# Patient Record
Sex: Male | Born: 1950 | Race: Black or African American | Hispanic: No | Marital: Married | Smoking: Never smoker
Health system: Southern US, Community
[De-identification: ages and names within clinical notes are randomized; demographics above are authoritative.]

## PROBLEM LIST (undated history)

## (undated) DIAGNOSIS — E785 Hyperlipidemia, unspecified: Secondary | ICD-10-CM

## (undated) DIAGNOSIS — N529 Male erectile dysfunction, unspecified: Secondary | ICD-10-CM

## (undated) DIAGNOSIS — I10 Essential (primary) hypertension: Secondary | ICD-10-CM

## (undated) DIAGNOSIS — M1990 Unspecified osteoarthritis, unspecified site: Secondary | ICD-10-CM

## (undated) HISTORY — DX: Essential (primary) hypertension: I10

## (undated) HISTORY — DX: Hyperlipidemia, unspecified: E78.5

## (undated) HISTORY — PX: NO PAST SURGERIES: SHX2092

---

## 2008-09-12 DIAGNOSIS — E559 Vitamin D deficiency, unspecified: Secondary | ICD-10-CM | POA: Insufficient documentation

## 2009-01-26 ENCOUNTER — Ambulatory Visit: Payer: Self-pay | Admitting: Family Medicine

## 2009-08-08 ENCOUNTER — Ambulatory Visit: Payer: Self-pay | Admitting: Family Medicine

## 2009-08-15 ENCOUNTER — Ambulatory Visit: Payer: Self-pay | Admitting: Unknown Physician Specialty

## 2009-08-15 HISTORY — PX: COLONOSCOPY: SHX174

## 2009-08-15 LAB — HM COLONOSCOPY

## 2010-08-07 ENCOUNTER — Ambulatory Visit: Payer: Self-pay | Admitting: Family Medicine

## 2011-03-05 LAB — HEPATIC FUNCTION PANEL
ALK PHOS: 63 U/L (ref 25–125)
ALT: 16 U/L (ref 10–40)
AST: 18 U/L (ref 14–40)
Bilirubin, Total: 0.4 mg/dL

## 2011-03-05 LAB — CBC AND DIFFERENTIAL
HEMATOCRIT: 40 % — AB (ref 41–53)
HEMOGLOBIN: 13.5 g/dL (ref 13.5–17.5)
Platelets: 167 10*3/uL (ref 150–399)
WBC: 4.3 10*3/mL

## 2013-03-16 LAB — TSH: TSH: 0.83 u[IU]/mL (ref 0.41–5.90)

## 2014-05-27 HISTORY — PX: EYE SURGERY: SHX253

## 2014-06-20 LAB — BASIC METABOLIC PANEL
BUN: 16 mg/dL (ref 4–21)
Creatinine: 1 mg/dL (ref 0.6–1.3)
GLUCOSE: 81 mg/dL
POTASSIUM: 3.9 mmol/L (ref 3.4–5.3)
SODIUM: 141 mmol/L (ref 137–147)

## 2014-06-20 LAB — LIPID PANEL
CHOLESTEROL: 196 mg/dL (ref 0–200)
HDL: 96 mg/dL — AB (ref 35–70)
LDL Cholesterol: 91 mg/dL
LDl/HDL Ratio: 0.9
TRIGLYCERIDES: 44 mg/dL (ref 40–160)

## 2014-10-20 LAB — PSA: PSA: 5.2

## 2015-05-27 ENCOUNTER — Other Ambulatory Visit: Payer: Self-pay | Admitting: Family Medicine

## 2015-06-01 ENCOUNTER — Other Ambulatory Visit: Payer: Self-pay | Admitting: Family Medicine

## 2015-07-07 ENCOUNTER — Other Ambulatory Visit: Payer: Self-pay | Admitting: Family Medicine

## 2015-08-17 DIAGNOSIS — H2513 Age-related nuclear cataract, bilateral: Secondary | ICD-10-CM | POA: Diagnosis not present

## 2015-08-17 DIAGNOSIS — H2589 Other age-related cataract: Secondary | ICD-10-CM | POA: Diagnosis not present

## 2015-08-21 ENCOUNTER — Other Ambulatory Visit: Payer: Self-pay | Admitting: Family Medicine

## 2015-09-07 DIAGNOSIS — H2511 Age-related nuclear cataract, right eye: Secondary | ICD-10-CM | POA: Diagnosis not present

## 2015-09-07 DIAGNOSIS — H2589 Other age-related cataract: Secondary | ICD-10-CM | POA: Diagnosis not present

## 2015-12-21 ENCOUNTER — Ambulatory Visit (INDEPENDENT_AMBULATORY_CARE_PROVIDER_SITE_OTHER): Payer: Federal, State, Local not specified - PPO | Admitting: Family Medicine

## 2015-12-21 ENCOUNTER — Encounter: Payer: Self-pay | Admitting: *Deleted

## 2015-12-21 VITALS — BP 148/74 | HR 60 | Temp 98.0°F | Ht 69.0 in | Wt 162.0 lb

## 2015-12-21 DIAGNOSIS — R972 Elevated prostate specific antigen [PSA]: Secondary | ICD-10-CM | POA: Diagnosis not present

## 2015-12-21 DIAGNOSIS — K148 Other diseases of tongue: Secondary | ICD-10-CM

## 2015-12-21 DIAGNOSIS — Z8619 Personal history of other infectious and parasitic diseases: Secondary | ICD-10-CM | POA: Insufficient documentation

## 2015-12-21 DIAGNOSIS — F4320 Adjustment disorder, unspecified: Secondary | ICD-10-CM | POA: Insufficient documentation

## 2015-12-21 DIAGNOSIS — M5090 Cervical disc disorder, unspecified, unspecified cervical region: Secondary | ICD-10-CM | POA: Insufficient documentation

## 2015-12-21 DIAGNOSIS — I10 Essential (primary) hypertension: Secondary | ICD-10-CM

## 2015-12-21 DIAGNOSIS — G8929 Other chronic pain: Secondary | ICD-10-CM

## 2015-12-21 DIAGNOSIS — M549 Dorsalgia, unspecified: Secondary | ICD-10-CM

## 2015-12-21 DIAGNOSIS — N400 Enlarged prostate without lower urinary tract symptoms: Secondary | ICD-10-CM | POA: Insufficient documentation

## 2015-12-21 DIAGNOSIS — K4090 Unilateral inguinal hernia, without obstruction or gangrene, not specified as recurrent: Secondary | ICD-10-CM | POA: Diagnosis not present

## 2015-12-21 DIAGNOSIS — J309 Allergic rhinitis, unspecified: Secondary | ICD-10-CM | POA: Insufficient documentation

## 2015-12-21 DIAGNOSIS — N529 Male erectile dysfunction, unspecified: Secondary | ICD-10-CM | POA: Insufficient documentation

## 2015-12-21 MED ORDER — MELOXICAM 7.5 MG PO TABS: 7.5000 mg | ORAL_TABLET | Freq: Every day | ORAL | 5 refills | Status: DC

## 2015-12-21 NOTE — Progress Notes (Addendum)
Patient: Robert Patton, Male    DOB: [birth year], 65 y.o.   MRN: [id number] Visit Date: 12/21/2015  Today's Provider: [name], MD   Chief Complaint  Patient presents with  . Annual Exam   Subjective:   Robert Patton is a 65 y.o. male who presents today for his Subsequent Annual Wellness Visit. He feels well. He reports exercising daily. He reports he is sleeping well. Patient does have chronic low back pain . This is slowly worsened over the past few years. Hypertension is fairly well controlled. He does have a spot on his tongue that is been there for many years. He says he bit his tongue years ago and it has not changed. 08/15/2009 Colonoscopy-internal hemorrhoids, repeat 10 years.  Review of Systems  Constitutional: Negative.   HENT: Negative.   Eyes: Negative.   Respiratory: Negative.   Cardiovascular: Negative.   Gastrointestinal: Negative.   Endocrine: Negative.   Genitourinary: Negative.   Musculoskeletal: Positive for back pain (and waste pain).  Skin: Negative.   Allergic/Immunologic: Negative.   Neurological: Negative.   Hematological: Negative.   Psychiatric/Behavioral: Negative.     Patient Active Problem List   Diagnosis Date Noted  . Adaptation reaction 12/21/2015  . Allergic rhinitis 12/21/2015  . Back pain, chronic 12/21/2015  . 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 Cervical disc disease 12/21/2015  . History of chicken pox 12/21/2015  . 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 Hernia, inguinal, unilateral 01/02/2009  . Avitaminosis D 09/12/2008  . Essential (primary) hypertension 05/28/2003    Social History   Social History  . Marital status: Married    Spouse name: N/A  . Number of children: N/A  . Years of education: N/A   Occupational History  . Not on file.   Social History Main Topics  . Smoking status: Never Smoker  . Smokeless tobacco: Never Used  . Alcohol use No  . Drug use: No  . Sexual activity: Not on  file   Other Topics Concern  . Not on file   Social History Narrative  . No narrative on file    Past Surgical History:  Procedure Laterality Date  . NO PAST SURGERIES      His family history includes Hypertension in his brother.    Outpatient Medications Prior to Visit  Medication Sig Dispense Refill  . aspirin 81 MG tablet Take by mouth.    . clonazePAM (KLONOPIN) 0.5 MG tablet Take by mouth.    . hydrochlorothiazide (HYDRODIURIL) 25 MG tablet TAKE 1 TABLET BY MOUTH EVERY DAY 30 tablet 12  . MULTIPLE VITAMIN PO Take by mouth.    . naproxen (NAPROSYN) 500 MG tablet Take by mouth.    . Vitamin D, Ergocalciferol, (DRISDOL) 50000 units CAPS capsule TAKE 1 CAPSULE BY MOUTH WEEKLY 12 capsule 04  . sildenafil (REVATIO) 20 MG tablet Take by mouth.     No facility-administered medications prior to visit.     Allergies  Allergen Reactions  . Amlodipine Besylate     Insomnia    Patient Care Team: Jerrol Banana., MD as PCP - General (Family Medicine)  Objective:   Vitals:  Vitals:   12/21/15 1034  BP: (!) 148/74  Pulse: 60  Temp: 98 F (36.7 C)  TempSrc: Oral  Weight: 162 lb (73.5 kg)  Height: 5\' 9"  (1.753 m)    Physical Exam  Constitutional: He is oriented to person, place, and time. He appears well-developed and well-nourished.  HENT:  Head: Normocephalic and atraumatic.  Right Ear: External ear normal.  Left Ear: External ear normal.  Nose: Nose normal.  Mouth/Throat: Oropharynx is clear and moist.  On the left mid section of the tongue there is a fleshy mass. It appears to be normal tissue that has never been evaluated.  Eyes: Conjunctivae and EOM are normal. Pupils are equal, round, and reactive to light.  Neck: Normal range of motion. Neck supple.  Cardiovascular: Normal rate, regular rhythm, normal heart sounds and intact distal pulses.   Pulmonary/Chest: Effort normal and breath sounds normal.  Abdominal: Soft. Bowel sounds are normal.   Genitourinary: Rectum normal, prostate normal and penis normal.  Genitourinary Comments: He does have a small right inguinal hernia that is easily reduced.  Musculoskeletal: Normal range of motion.  Neurological: He is alert and oriented to person, place, and time. He has normal reflexes.  Skin: Skin is warm and dry.  Psychiatric: He has a normal mood and affect. His behavior is normal. Judgment and thought content normal.    Activities of Daily Living In your present state of health, do you have any difficulty performing the following activities: 12/21/2015  Hearing? N  Vision? N  Difficulty concentrating or making decisions? N  Walking or climbing stairs? N  Dressing or bathing? N  Doing errands, shopping? N  Some recent data might be hidden    Fall Risk Assessment Fall Risk  12/21/2015  Falls in the past year? No     Depression Screen PHQ 2/9 Scores 12/21/2015  PHQ - 2 Score 0    Cognitive Testing - 6-CIT    Year: 0 4 points  Month: 0 3 points  Memorize "Pia Mau, [address], [place]"  Time (within 1 hour:) 0 3 points  Count backwards from 20: 0 2 4 points  Name months of year: 0 2 4 points  Repeat Address: 0 2 4 6 8 10  points   Total Score: 6/28  Interpretation : Normal (0-7) Abnormal (8-28)    Assessment & Plan:     Annual Wellness Visit  Reviewed patient's Family Medical History Reviewed and updated list of patient's medical providers Assessment of cognitive impairment was done Assessed patient's functional ability Established a written schedule for health screening [place] Completed and Reviewed  Exercise Activities and Dietary recommendations Goals    None      Immunization History  Administered Date(s) Administered  . Pneumococcal Conjugate-13 07/20/2014  . Tdap 04/08/2011  . Zoster 12/24/2011    Health Maintenance  Topic Date Due  . PNA vac Low Risk Adult (2 of 2 - PPSV23) 07/21/2015  . INFLUENZA VACCINE   12/26/2015  . COLONOSCOPY  08/16/2019  . TETANUS/TDAP  04/07/2021  . ZOSTAVAX  Completed  . Hepatitis C Screening  Completed        Discussed health benefits of physical activity, and encouraged him to engage in regular exercise appropriate for his age and condition.  Chronic low back pain Hypertension Hyperlipidemia Chronic mild elevation of PSA Fleshy tongue lesion Right inguinal hernia Obtain x-ray for the back pain. Will try meloxicam 7.5 mg daily for a month. I told him this is not a long-term treatment due to possible long-term side effects of the NSAID. Obtain lab work for his chronic medical issues. Refer to ENT to make sure that this is a benign tongue lesion. Refer to surgery for options for his inguinal hernia  Patient was seen and examined by Dr. Delfino Lovett L.  Cranford Mon and the note was scribed by Althea Charon, RMA.   Miguel Aschoff MD [facility] 12/21/2015 10:41 AM  ------------------------------------------------------------------------------------------------------------

## 2015-12-22 LAB — CBC WITH DIFFERENTIAL/PLATELET
BASOS ABS: 0 10*3/uL (ref 0.0–0.2)
Basos: 0 %
EOS (ABSOLUTE): 0.2 10*3/uL (ref 0.0–0.4)
Eos: 4 %
HEMATOCRIT: 40.6 % (ref 37.5–51.0)
HEMOGLOBIN: 13.8 g/dL (ref 12.6–17.7)
Immature Grans (Abs): 0 10*3/uL (ref 0.0–0.1)
Immature Granulocytes: 0 %
LYMPHS ABS: 2.3 10*3/uL (ref 0.7–3.1)
Lymphs: 42 %
MCH: 28.7 pg (ref 26.6–33.0)
MCHC: 34 g/dL (ref 31.5–35.7)
MCV: 84 fL (ref 79–97)
MONOCYTES: 5 %
MONOS ABS: 0.2 10*3/uL (ref 0.1–0.9)
NEUTROS ABS: 2.6 10*3/uL (ref 1.4–7.0)
Neutrophils: 49 %
Platelets: 183 10*3/uL (ref 150–379)
RBC: 4.81 x10E6/uL (ref 4.14–5.80)
RDW: 14 % (ref 12.3–15.4)
WBC: 5.4 10*3/uL (ref 3.4–10.8)

## 2015-12-22 LAB — COMPREHENSIVE METABOLIC PANEL
ALBUMIN: 4.2 g/dL (ref 3.6–4.8)
ALK PHOS: 71 IU/L (ref 39–117)
ALT: 13 IU/L (ref 0–44)
AST: 22 IU/L (ref 0–40)
Albumin/Globulin Ratio: 1.4 (ref 1.2–2.2)
BILIRUBIN TOTAL: 0.3 mg/dL (ref 0.0–1.2)
BUN / CREAT RATIO: 11 (ref 10–24)
BUN: 12 mg/dL (ref 8–27)
CHLORIDE: 102 mmol/L (ref 96–106)
CO2: 26 mmol/L (ref 18–29)
Calcium: 9.3 mg/dL (ref 8.6–10.2)
Creatinine, Ser: 1.09 mg/dL (ref 0.76–1.27)
GFR calc Af Amer: 81 mL/min/{1.73_m2} (ref >59)
GFR calc non Af Amer: 70 mL/min/{1.73_m2} (ref >59)
GLOBULIN, TOTAL: 2.9 g/dL (ref 1.5–4.5)
GLUCOSE: 80 mg/dL (ref 65–99)
Potassium: 3.8 mmol/L (ref 3.5–5.2)
SODIUM: 142 mmol/L (ref 134–144)
Total Protein: 7.1 g/dL (ref 6.0–8.5)

## 2015-12-22 LAB — PSA: PROSTATE SPECIFIC AG, SERUM: 5 ng/mL — AB (ref 0.0–4.0)

## 2015-12-22 LAB — TSH: TSH: 1.29 u[IU]/mL (ref 0.450–4.500)

## 2015-12-26 ENCOUNTER — Ambulatory Visit: Payer: Self-pay | Admitting: General Surgery

## 2015-12-29 ENCOUNTER — Telehealth: Payer: Self-pay | Admitting: Family Medicine

## 2015-12-29 NOTE — Telephone Encounter (Signed)
Pt called saying that the meloxicam 7.5 is not helping his lower back pain.  Please advise.  He was in on 7/27.  PCB is [phone]  Thanks [name]

## 2015-12-29 NOTE — Telephone Encounter (Signed)
Please review-aa 

## 2015-12-30 NOTE — Telephone Encounter (Signed)
Try twice a day just for the rest of August. May need physical therapy referral.

## 2016-01-01 ENCOUNTER — Telehealth: Payer: Self-pay | Admitting: Family Medicine

## 2016-01-01 NOTE — Telephone Encounter (Signed)
Pt would like a call back to get his lab results. Thanks TNP

## 2016-01-01 NOTE — Telephone Encounter (Signed)
Advised patient as below.  

## 2016-01-01 NOTE — Telephone Encounter (Signed)
Advised patient of lab results  

## 2016-01-02 ENCOUNTER — Telehealth: Payer: Self-pay | Admitting: Family Medicine

## 2016-01-02 NOTE — Telephone Encounter (Signed)
His call back is [phone].  Thanks [name]  Sorry closed out message too soon.

## 2016-01-02 NOTE — Telephone Encounter (Signed)
Pt informed. Labs ready for pick up.

## 2016-01-02 NOTE — Telephone Encounter (Signed)
Pt would like for [place] to print out a copy of labs and he will come by and pick up a copy.  His call back is

## 2016-01-10 ENCOUNTER — Telehealth: Payer: Self-pay | Admitting: Family Medicine

## 2016-01-10 MED ORDER — HYDROCODONE-HOMATROPINE 5-1.5 MG/5ML PO SYRP: 5.0000 mL | ORAL_SOLUTION | Freq: Three times a day (TID) | ORAL | 0 refills | Status: DC | PRN

## 2016-01-10 NOTE — Telephone Encounter (Signed)
Pt would like to get a medication for cough.  He always coughs at night.  He uses [facility] [address]  PCB#  [phone]  Thanks, [name]

## 2016-01-10 NOTE — Telephone Encounter (Signed)
Patient reports that he has had a cough for about 1 week. He has been using OTC allergy medication with no relief. He reports that his cough is worse at night. Patient denies any fever or shortness of breath. Patient reports that he had left over prescription cough syrup which helped last night. Patient is requesting a refill. Please advise. Thanks!

## 2016-01-10 NOTE — Telephone Encounter (Signed)
2 oz rf --thx

## 2016-01-10 NOTE — Telephone Encounter (Signed)
Printed

## 2016-01-12 ENCOUNTER — Telehealth: Payer: Self-pay | Admitting: Family Medicine

## 2016-01-19 DIAGNOSIS — D101 Benign neoplasm of tongue: Secondary | ICD-10-CM | POA: Diagnosis not present

## 2016-02-01 NOTE — Telephone Encounter (Signed)
Pt would like to pick up a letter stating he had his CPE so he can turn it into his employer [employer]. Pt was scheduled for his CPE on 12/21/15, not sure if it turned into an OV. Please advise. Thanks TNP

## 2016-02-01 NOTE — Telephone Encounter (Signed)
It was both. Okay to write a note.

## 2016-02-01 NOTE — Telephone Encounter (Signed)
It was CPE with additional issues. Please review-aa

## 2016-02-06 NOTE — Telephone Encounter (Signed)
Pt called for an update about letter for work. I advised that if he needed Dr. Rosanna Randy to sign the letter it wouldn't be ready until next week because Dr. Rosanna Randy is out of the office this week but if someone else in the office could sign it, it would be ready this week. Pt stated that he would contact his employer and call us back. Thanks TNP

## 2016-02-07 NOTE — Telephone Encounter (Signed)
Letter typed up and waiting for the provider to come back to the office on 9/18 to sign-aa

## 2016-02-08 ENCOUNTER — Encounter: Payer: Self-pay | Admitting: *Deleted

## 2016-02-12 NOTE — Telephone Encounter (Signed)
Pt advised, letter placed up front-aa

## 2016-03-04 ENCOUNTER — Ambulatory Visit: Payer: Self-pay | Admitting: General Surgery

## 2016-03-06 ENCOUNTER — Ambulatory Visit: Payer: Self-pay | Admitting: General Surgery

## 2016-03-12 ENCOUNTER — Encounter: Payer: Self-pay | Admitting: General Surgery

## 2016-03-12 ENCOUNTER — Ambulatory Visit (INDEPENDENT_AMBULATORY_CARE_PROVIDER_SITE_OTHER): Payer: Commercial Managed Care - PPO | Admitting: General Surgery

## 2016-03-12 VITALS — BP 166/86 | HR 60 | Resp 12 | Ht 69.0 in | Wt 166.0 lb

## 2016-03-12 DIAGNOSIS — K4090 Unilateral inguinal hernia, without obstruction or gangrene, not specified as recurrent: Secondary | ICD-10-CM | POA: Diagnosis not present

## 2016-03-12 NOTE — Patient Instructions (Addendum)
The patient is aware to call back for any questions or concerns. Inguinal Hernia, Adult Muscles help keep everything in the body in its proper place. But if a weak spot in the muscles develops, something can poke through. That is called a hernia. When this happens in the lower part of the belly (abdomen), it is called an inguinal hernia. (It takes its name from a part of the body in this region called the inguinal canal.) A weak spot in the wall of muscles lets some fat or part of the small intestine bulge through. An inguinal hernia can develop at any age. Men get them more often than women. CAUSES  In adults, an inguinal hernia develops over time.  It can be triggered by:  Suddenly straining the muscles of the lower abdomen.  Lifting heavy objects.  Straining to have a bowel movement. Difficult bowel movements (constipation) can lead to this.  Constant coughing. This may be caused by smoking or lung disease.  Being overweight.  Being pregnant.  Working at a job that requires long periods of standing or heavy lifting.  Having had an inguinal hernia before. One type can be an emergency situation. It is called a strangulated inguinal hernia. It develops if part of the small intestine slips through the weak spot and cannot get back into the abdomen. The blood supply can be cut off. If that happens, part of the intestine may die. This situation requires emergency surgery. SYMPTOMS  Often, a small inguinal hernia has no symptoms. It is found when a healthcare provider does a physical exam. Larger hernias usually have symptoms.   In adults, symptoms may include:  A lump in the groin. This is easier to see when the person is standing. It might disappear when lying down.  In men, a lump in the scrotum.  Pain or burning in the groin. This occurs especially when lifting, straining or coughing.  A dull ache or feeling of pressure in the groin.  Signs of a strangulated hernia can  include:  A bulge in the groin that becomes very painful and tender to the touch.  A bulge that turns red or purple.  Fever, nausea and vomiting.  Inability to have a bowel movement or to pass gas. DIAGNOSIS  To decide if you have an inguinal hernia, a healthcare provider will probably do a physical examination.  This will include asking questions about any symptoms you have noticed.  The healthcare provider might feel the groin area and ask you to cough. If an inguinal hernia is felt, the healthcare provider may try to slide it back into the abdomen.  Usually no other tests are needed. TREATMENT  Treatments can vary. The size of the hernia makes a difference. Options include:  Watchful waiting. This is often suggested if the hernia is small and you have had no symptoms.  No medical procedure will be done unless symptoms develop.  You will need to watch closely for symptoms. If any occur, contact your healthcare provider right away.  Surgery. This is used if the hernia is larger or you have symptoms.  Open surgery. This is usually an outpatient procedure (you will not stay overnight in a hospital). An cut (incision) is made through the skin in the groin. The hernia is put back inside the abdomen. The weak area in the muscles is then repaired by herniorrhaphy or hernioplasty. Herniorrhaphy: in this type of surgery, the weak muscles are sewn back together. Hernioplasty: a patch or mesh is   used to close the weak area in the abdominal wall.  Laparoscopy. In this procedure, a surgeon makes small incisions. A thin tube with a tiny video camera (called a laparoscope) is put into the abdomen. The surgeon repairs the hernia with mesh by looking with the video camera and using two long instruments. HOME CARE INSTRUCTIONS   After surgery to repair an inguinal hernia:  You will need to take pain medicine prescribed by your healthcare provider. Follow all directions carefully.  You will need  to take care of the wound from the incision.  Your activity will be restricted for awhile. This will probably include no heavy lifting for several weeks. You also should not do anything too active for a few weeks. When you can return to work will depend on the type of job that you have.  During "watchful waiting" periods, you should:  Maintain a healthy weight.  Eat a diet high in fiber (fruits, vegetables and whole grains).  Drink plenty of fluids to avoid constipation. This means drinking enough water and other liquids to keep your urine clear or pale yellow.  Do not lift heavy objects.  Do not stand for long periods of time.  Quit smoking. This should keep you from developing a frequent cough. SEEK MEDICAL CARE IF:   A bulge develops in your groin area.  You feel pain, a burning sensation or pressure in the groin. This might be worse if you are lifting or straining.  You develop a fever of more than 100.5 F (38.1 C). SEEK IMMEDIATE MEDICAL CARE IF:   Pain in the groin increases suddenly.  A bulge in the groin gets bigger suddenly and does not go down.  For men, there is sudden pain in the scrotum. Or, the size of the scrotum increases.  A bulge in the groin area becomes red or purple and is painful to touch.  You have nausea or vomiting that does not go away.  You feel your heart beating much faster than normal.  You cannot have a bowel movement or pass gas.  You develop a fever of more than 102.0 F (38.9 C).   This information is not intended to replace advice given to you by your health care provider. Make sure you discuss any questions you have with your health care provider.   Document Released: 09/29/2008 Document Revised: 08/05/2011 Document Reviewed: 11/14/2014 Elsevier Interactive Patient Education 2016 Elsevier Inc.  

## 2016-03-12 NOTE — Progress Notes (Signed)
Patient ID: Robert Patton, male   DOB: 13-Feb-1951, 65 y.o.   MRN: [id number]  Chief Complaint  Patient presents with  . Other    hernia    HPI Robert Patton is a 65 y.o. male here today for a evaluation of a right inguinal hernia. Patient states he noticed this area since 2007. He states there is no pain or change in size. He is still working a s anurse. I have reviewed the history of present illness with the patient.  HPI  Past Medical History:  Diagnosis Date  . Hyperlipidemia   . Hypertension     Past Surgical History:  Procedure Laterality Date  . COLONOSCOPY  08/15/2009  . NO PAST SURGERIES      Family History  Problem Relation Age of Onset  . Hypertension Brother     Social History Social History  Substance Use Topics  . Smoking status: Never Smoker  . Smokeless tobacco: Never Used  . Alcohol use No    Allergies  Allergen Reactions  . Amlodipine Besylate     Insomnia    Current Outpatient Prescriptions  Medication Sig Dispense Refill  . aspirin 81 MG tablet Take by mouth.    . clonazePAM (KLONOPIN) 0.5 MG tablet Take by mouth.    . hydrochlorothiazide (HYDRODIURIL) 25 MG tablet TAKE 1 TABLET BY MOUTH EVERY DAY 30 tablet 12  . meloxicam (MOBIC) 7.5 MG tablet Take 1 tablet (7.5 mg total) by mouth daily. 30 tablet 5  . MULTIPLE VITAMIN PO Take by mouth.    . naproxen (NAPROSYN) 500 MG tablet Take by mouth.    . Vitamin D, Ergocalciferol, (DRISDOL) 50000 units CAPS capsule TAKE 1 CAPSULE BY MOUTH WEEKLY 12 capsule 04   No current facility-administered medications for this visit.     Review of Systems Review of Systems  Constitutional: Negative.   Respiratory: Negative.   Cardiovascular: Negative.     Blood pressure (!) 166/86, pulse 60, resp. rate 12, height 5\' 9"  (1.753 m), weight 166 lb (75.3 kg).  Physical Exam Physical Exam  Constitutional: He is oriented to person, place, and time. He appears well-developed and well-nourished.  Eyes:  Conjunctivae are normal. No scleral icterus.  Neck: Neck supple.  Cardiovascular: Normal rate, regular rhythm and normal heart sounds.   Pulmonary/Chest: Effort normal and breath sounds normal.  Abdominal: Soft. Normal appearance and bowel sounds are normal. There is no hepatomegaly. There is no tenderness. A hernia is present. Hernia confirmed positive in the right inguinal area ( medium to large sized hernia, reducible, mildly tender).  Lymphadenopathy:    He has no cervical adenopathy.  Neurological: He is alert and oriented to person, place, and time.  Skin: Skin is warm and dry.    Data Reviewed Notes reviewed   Assessment    Right inguinal hernia     Plan       Hernia precautions and incarceration were discussed with the patient. If they develop symptoms of an incarcerated hernia, they were encouraged to seek prompt medical attention.  I have recommended repair of the hernia using mesh on an outpatient basis in the near future. The risk of infection was reviewed. The role of prosthetic mesh to minimize the risk of recurrence was reviewed. Both laparoscopic and open approaches explained  This information has been scribed by Gaspar Cola CMA.  SANKAR,SEEPLAPUTHUR G 03/12/2016, 11:28 AM

## 2016-03-26 ENCOUNTER — Ambulatory Visit: Payer: Medicare Other | Admitting: General Surgery

## 2016-04-10 ENCOUNTER — Encounter: Payer: Self-pay | Admitting: Family Medicine

## 2016-04-10 ENCOUNTER — Ambulatory Visit (INDEPENDENT_AMBULATORY_CARE_PROVIDER_SITE_OTHER): Payer: Federal, State, Local not specified - PPO | Admitting: Family Medicine

## 2016-04-10 VITALS — BP 158/80 | HR 72 | Temp 98.1°F | Resp 16 | Wt 167.0 lb

## 2016-04-10 DIAGNOSIS — N529 Male erectile dysfunction, unspecified: Secondary | ICD-10-CM

## 2016-04-10 DIAGNOSIS — I10 Essential (primary) hypertension: Secondary | ICD-10-CM | POA: Diagnosis not present

## 2016-04-10 MED ORDER — LOSARTAN POTASSIUM 50 MG PO TABS: 50.0000 mg | ORAL_TABLET | Freq: Every day | ORAL | 12 refills | Status: DC

## 2016-04-10 MED ORDER — SILDENAFIL CITRATE 20 MG PO TABS: 20.0000 mg | ORAL_TABLET | Freq: Every day | ORAL | 11 refills | Status: DC | PRN

## 2016-04-10 NOTE — Progress Notes (Signed)
Subjective:  HPI Pt is here for elevated BP. He reports that his BP has been running in the 150's/80's. He is taking his HCTZ 25 mg daily. Denies chest pain shortness of breath. He is intolerant to Amlodipine per his chart.  He requests refills for his ED. No issues with energy or libido.  Prior to Admission medications   Medication Sig Start Date End Date Taking? Authorizing Provider  aspirin 81 MG tablet Take by mouth.    Historical Provider, MD  clonazePAM (KLONOPIN) 0.5 MG tablet Take by mouth. 03/16/13   Historical Provider, MD  hydrochlorothiazide (HYDRODIURIL) 25 MG tablet TAKE 1 TABLET BY MOUTH EVERY DAY 08/21/15   Jerrol Banana., MD  meloxicam (MOBIC) 7.5 MG tablet Take 1 tablet (7.5 mg total) by mouth daily. 12/21/15   Grayce Budden Maceo Pro., MD  MULTIPLE VITAMIN PO Take by mouth.    Historical Provider, MD  naproxen (NAPROSYN) 500 MG tablet Take by mouth. 06/21/14   Historical Provider, MD  Vitamin D, Ergocalciferol, (DRISDOL) 50000 units CAPS capsule TAKE 1 CAPSULE BY MOUTH WEEKLY 05/27/15   Birdie Sons, MD    Patient Active Problem List   Diagnosis Date Noted  . Adaptation reaction 12/21/2015  . Allergic rhinitis 12/21/2015  . Back pain, chronic 12/21/2015  . 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 Cervical disc disease 12/21/2015  . History of chicken pox 12/21/2015  . 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 Hernia, inguinal, unilateral 01/02/2009  . Avitaminosis D 09/12/2008  . Essential (primary) hypertension 05/28/2003    Past Medical History:  Diagnosis Date  . Hyperlipidemia   . Hypertension     Social History   Social History  . Marital status: Married    Spouse name: N/A  . Number of children: N/A  . Years of education: N/A   Occupational History  . Not on file.   Social History Main Topics  . Smoking status: Never Smoker  . Smokeless tobacco: Never Used  . Alcohol use No  . Drug use: No  . Sexual activity: Not  on file   Other Topics Concern  . Not on file   Social History Narrative  . No narrative on file    Allergies  Allergen Reactions  . Amlodipine Besylate     Insomnia    Review of Systems  Constitutional: Negative.   HENT: Negative.   Eyes: Negative.   Respiratory: Negative.   Cardiovascular: Negative.   Gastrointestinal: Negative.   Genitourinary: Negative.   Musculoskeletal: Positive for back pain.  Skin: Negative.   Neurological: Negative.   Endo/Heme/Allergies: Negative.   Psychiatric/Behavioral: Negative.     Immunization History  Administered Date(s) Administered  . Pneumococcal Conjugate-13 07/20/2014  . Tdap 04/08/2011  . Zoster 12/24/2011    Objective:  BP (!) 158/80 (BP Location: Left Arm, Patient Position: Sitting, Cuff Size: Normal)   Pulse 72   Temp 98.1 F (36.7 C) (Oral)   Resp 16   Wt 167 lb (75.8 kg)   BMI 24.66 kg/m   Physical Exam  Constitutional: He is oriented to person, place, and time and well-developed, well-nourished, and in no distress.  Eyes: Conjunctivae and EOM are normal. Pupils are equal, round, and reactive to light.  Cardiovascular: Normal rate, regular rhythm, normal heart sounds and intact distal pulses.   Pulmonary/Chest: Effort normal.  Musculoskeletal: Normal range of motion.  Neurological: He is alert and oriented to person, place, and time. He has normal  reflexes. Gait normal. GCS score is 15.  Skin: Skin is warm and dry.  Psychiatric: Mood, memory, affect and judgment normal.    Lab Results  Component Value Date   WBC 5.4 12/21/2015   HGB 13.5 03/05/2011   HCT 40.6 12/21/2015   PLT 183 12/21/2015   GLUCOSE 80 12/21/2015   CHOL 196 06/20/2014   TRIG 44 06/20/2014   HDL 96 (A) 06/20/2014   LDLCALC 91 06/20/2014   TSH 1.290 12/21/2015   PSA 5.2 10/20/2014    CMP     Component Value Date/Time   NA 142 12/21/2015 1117   K 3.8 12/21/2015 1117   CL 102 12/21/2015 1117   CO2 26 12/21/2015 1117   GLUCOSE 80  12/21/2015 1117   BUN 12 12/21/2015 1117   CREATININE 1.09 12/21/2015 1117   CALCIUM 9.3 12/21/2015 1117   PROT 7.1 12/21/2015 1117   ALBUMIN 4.2 12/21/2015 1117   AST 22 12/21/2015 1117   ALT 13 12/21/2015 1117   ALKPHOS 71 12/21/2015 1117   BILITOT 0.3 12/21/2015 1117   GFRNONAA 70 12/21/2015 1117   GFRAA 81 12/21/2015 1117    Assessment and Plan :  1. Essential (primary) hypertension Add losartan as below. Follow up in 1 month.Goal of systolic less than [id number]. - losartan (COZAAR) 50 MG tablet; Take 1 tablet (50 mg total) by mouth daily.  Dispense: 30 tablet; Refill: 12  2. ED (erectile dysfunction) of organic origin  - sildenafil (REVATIO) 20 MG tablet; Take 1 tablet (20 mg total) by mouth daily as needed. Take 1-4 daily PRN sexual activity  Dispense: 25 tablet; Refill: 11   HPI, Exam, and A&P Transcribed under the direction and in the presence of Marlon Suleiman L. Cranford Mon, MD  Electronically Signed: Webb Laws, CMA I have done the exam and reviewed the above chart and it is accurate to the best of my knowledge. Development worker, community has been used in this note in any air is in the dictation or transcription are unintentional.   [facility] 04/10/2016 3:47 PM

## 2016-05-23 ENCOUNTER — Encounter: Payer: Self-pay | Admitting: *Deleted

## 2016-06-03 ENCOUNTER — Encounter: Payer: Self-pay | Admitting: Family Medicine

## 2016-06-03 ENCOUNTER — Ambulatory Visit (INDEPENDENT_AMBULATORY_CARE_PROVIDER_SITE_OTHER): Payer: Commercial Managed Care - PPO | Admitting: Family Medicine

## 2016-06-03 VITALS — BP 172/84 | HR 64 | Temp 98.2°F | Resp 16 | Wt 173.0 lb

## 2016-06-03 DIAGNOSIS — R059 Cough, unspecified: Secondary | ICD-10-CM

## 2016-06-03 DIAGNOSIS — J410 Simple chronic bronchitis: Secondary | ICD-10-CM

## 2016-06-03 DIAGNOSIS — R05 Cough: Secondary | ICD-10-CM | POA: Diagnosis not present

## 2016-06-03 DIAGNOSIS — I10 Essential (primary) hypertension: Secondary | ICD-10-CM

## 2016-06-03 DIAGNOSIS — N529 Male erectile dysfunction, unspecified: Secondary | ICD-10-CM

## 2016-06-03 DIAGNOSIS — G8929 Other chronic pain: Secondary | ICD-10-CM | POA: Diagnosis not present

## 2016-06-03 DIAGNOSIS — M545 Low back pain: Secondary | ICD-10-CM | POA: Diagnosis not present

## 2016-06-03 MED ORDER — MELOXICAM 7.5 MG PO TABS: 7.5000 mg | ORAL_TABLET | Freq: Every day | ORAL | 5 refills | Status: DC | PRN

## 2016-06-03 MED ORDER — SILDENAFIL CITRATE 20 MG PO TABS: 20.0000 mg | ORAL_TABLET | Freq: Every day | ORAL | 11 refills | Status: DC | PRN

## 2016-06-03 MED ORDER — DOXYCYCLINE HYCLATE 100 MG PO TABS: 100.0000 mg | ORAL_TABLET | Freq: Two times a day (BID) | ORAL | 0 refills | Status: DC

## 2016-06-03 MED ORDER — HYDROCODONE-HOMATROPINE 5-1.5 MG/5ML PO SYRP: 5.0000 mL | ORAL_SOLUTION | Freq: Three times a day (TID) | ORAL | 0 refills | Status: DC | PRN

## 2016-06-03 MED ORDER — LOSARTAN POTASSIUM 100 MG PO TABS: 100.0000 mg | ORAL_TABLET | Freq: Every day | ORAL | 3 refills | Status: DC

## 2016-06-03 NOTE — Patient Instructions (Signed)
For blood pressure increase Losartan to 100 mg and new prescription for this dose was sent in to your pharmacy.

## 2016-06-03 NOTE — Progress Notes (Signed)
Robert Patton  MRN: [id number] DOB: [birth year]  Subjective:  HPI  Patient started to feel bad last week-05/27/16, symptoms are runny nose, headache, chills, cough- productive with brownish phlegm and cough makes it hard for patient to rest at night. No ear pain, no sore throat. He has taking OTC allergy medications and gel capsules for the cough. He feels like he is getting worse. His blood pressure is still not well controlled at home. Systolic in the [id number]. Patient Active Problem List   Diagnosis Date Noted  . Adaptation reaction 12/21/2015  . Allergic rhinitis 12/21/2015  . Back pain, chronic 12/21/2015  . 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 Cervical disc disease 12/21/2015  . History of chicken pox 12/21/2015  . 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 Hernia, inguinal, unilateral 01/02/2009  . Avitaminosis D 09/12/2008  . Essential (primary) hypertension 05/28/2003    Past Medical History:  Diagnosis Date  . Hyperlipidemia   . Hypertension     Social History   Social History  . Marital status: Married    Spouse name: N/A  . Number of children: N/A  . Years of education: N/A   Occupational History  . Not on file.   Social History Main Topics  . Smoking status: Never Smoker  . Smokeless tobacco: Never Used  . Alcohol use No  . Drug use: No  . Sexual activity: Not on file   Other Topics Concern  . Not on file   Social History Narrative  . No narrative on file    Outpatient Encounter Prescriptions as of 06/03/2016  Medication Sig Note  . aspirin 81 MG tablet Take by mouth. 12/21/2015: Received from: [place]: ASPIRIN EC, 81MG  (Oral Tablet Delayed Release) - Historical Medication  1 daily (81 MG) Active  . clonazePAM (KLONOPIN) 0.5 MG tablet Take by mouth. 12/21/2015: Medication taken as needed.  Received from: [place]: CLONAZEPAM, 0.5MG  (Oral Tablet)  1/2 to one Tablet twice  daily as needed for nerves or sleep for 0 days  Quantity: 30;  Refills: 0   Ordered :16-Mar-2013    . hydrochlorothiazide (HYDRODIURIL) 25 MG tablet TAKE 1 TABLET BY MOUTH EVERY DAY   . losartan (COZAAR) 50 MG tablet Take 1 tablet (50 mg total) by mouth daily.   . meloxicam (MOBIC) 7.5 MG tablet Take 1 tablet (7.5 mg total) by mouth daily.   . MULTIPLE VITAMIN PO Take by mouth. 12/21/2015: Received from: [place]: MULTIVITAMINS (Oral Tablet) - Historical Medication  1 Daily Active  . naproxen (NAPROSYN) 500 MG tablet Take by mouth. 12/21/2015: Medication taken as needed.  Received from: [place]: NAPROXEN, 500MG  (Oral Tablet)  1 (one) Tablet two times daily as needed for back pain for 0 days  Quantity: 30;  Refills: 1   Ordered :21-Jun-2014  Fisher,  . sildenafil (REVATIO) 20 MG tablet Take 1 tablet (20 mg total) by mouth daily as needed. Take 1-4 daily PRN sexual activity   . Vitamin D, Ergocalciferol, (DRISDOL) 50000 units CAPS capsule TAKE 1 CAPSULE BY MOUTH WEEKLY    No facility-administered encounter medications on file as of 06/03/2016.     Allergies  Allergen Reactions  . Amlodipine Besylate     Insomnia    Review of Systems  Constitutional: Positive for chills and malaise/fatigue.  HENT: Positive for congestion.   Respiratory: Positive for cough and sputum production. Negative for shortness of breath and  wheezing.   Cardiovascular: Negative.   Musculoskeletal: Positive for back pain and joint pain.  Neurological: Positive for headaches.    Objective:  BP (!) 172/84   Pulse 64   Temp 98.2 F (36.8 C)   Resp 16   Wt 173 lb (78.5 kg)   SpO2 98%   BMI 25.55 kg/m   Physical Exam  Constitutional: He is oriented to person, place, and time and well-developed, well-nourished, and in no distress.  HENT:  Head: Normocephalic and atraumatic.  Right Ear: External ear normal.  Left Ear: External ear normal.  Nose:  Nose normal.  Mouth/Throat: Oropharynx is clear and moist.  Eyes: Conjunctivae are normal. Pupils are equal, round, and reactive to light.  Neck: Normal range of motion. Neck supple. No thyromegaly present.  Cardiovascular: Normal rate, regular rhythm, normal heart sounds and intact distal pulses.   No murmur heard. Pulmonary/Chest: Effort normal and breath sounds normal. No respiratory distress. He has no wheezes.  Lymphadenopathy:    He has no cervical adenopathy.  Neurological: He is alert and oriented to person, place, and time. Gait normal.  Skin: Skin is warm and dry.  Psychiatric: Mood, memory, affect and judgment normal.    Assessment and Plan :  1. Essential (primary) hypertension Elevated still. Increase Losartan to 100 mg. Re check on the next visit.Patient says he is intolerant of amlodipine. - losartan (COZAAR) 100 MG tablet; Take 1 tablet (100 mg total) by mouth daily.  Dispense: 90 tablet; Refill: 3  2. Cough RX for Hycodan syrup given.  3. Simple chronic bronchitis (HCC) Treat with Doxy and follow as needed. I'm treating this as the patient is feeling worse over the past few days instead of better. Refilled Meloxicam-patient takes it as needed for lower back pain.  HPI, Exam and A&P transcribed under direction and in the presence of Miguel Aschoff, MD. ,I have done the exam and reviewed the chart and it is accurate to the best of my knowledge. Development worker, community has been used and  any errors in dictation or transcription are unintentional. Miguel Aschoff M.D. [facility]

## 2016-06-18 ENCOUNTER — Telehealth: Payer: Self-pay

## 2016-06-18 ENCOUNTER — Other Ambulatory Visit: Payer: Self-pay

## 2016-06-18 MED ORDER — HYDROCODONE-HOMATROPINE 5-1.5 MG/5ML PO SYRP
5.0000 mL | ORAL_SOLUTION | Freq: Three times a day (TID) | ORAL | 0 refills | Status: DC | PRN
Start: 2016-06-18 — End: 2016-08-01

## 2016-06-18 MED ORDER — LEVOFLOXACIN 500 MG PO TABS: 500.0000 mg | ORAL_TABLET | Freq: Every day | ORAL | 0 refills | Status: DC

## 2016-06-18 NOTE — Telephone Encounter (Signed)
Pt was seen on 06/03/2016 for bronchitis. Was treated with Doxy and Hycodan. Pt reports he is not feeling better. Is still c/o productive cough (white sputum), rhinorrhea, sinus pain. Denies headache. Is afebrile. Finished Doxy one week ago. [place]. CB# [phone]. Renaldo Fiddler, CMA

## 2016-06-18 NOTE — Progress Notes (Unsigned)
n

## 2016-06-18 NOTE — Telephone Encounter (Signed)
Please review-aa 

## 2016-06-18 NOTE — Telephone Encounter (Signed)
Levaquin 500mg  daily for 1 week. Will need follow-up visit with someone if he does not improve from this

## 2016-07-01 ENCOUNTER — Ambulatory Visit: Payer: Commercial Managed Care - PPO | Admitting: Family Medicine

## 2016-07-02 ENCOUNTER — Ambulatory Visit (INDEPENDENT_AMBULATORY_CARE_PROVIDER_SITE_OTHER): Payer: Commercial Managed Care - PPO | Admitting: Family Medicine

## 2016-07-02 ENCOUNTER — Encounter: Payer: Self-pay | Admitting: Family Medicine

## 2016-07-02 VITALS — BP 170/90 | HR 78 | Temp 97.8°F | Resp 16 | Wt 171.0 lb

## 2016-07-02 DIAGNOSIS — G629 Polyneuropathy, unspecified: Secondary | ICD-10-CM | POA: Diagnosis not present

## 2016-07-02 DIAGNOSIS — R531 Weakness: Secondary | ICD-10-CM

## 2016-07-02 DIAGNOSIS — N529 Male erectile dysfunction, unspecified: Secondary | ICD-10-CM

## 2016-07-02 DIAGNOSIS — R5383 Other fatigue: Secondary | ICD-10-CM

## 2016-07-02 DIAGNOSIS — I10 Essential (primary) hypertension: Secondary | ICD-10-CM

## 2016-07-02 MED ORDER — DILTIAZEM HCL ER 180 MG PO CP24: 180.0000 mg | ORAL_CAPSULE | Freq: Every day | ORAL | 12 refills | Status: DC

## 2016-07-02 NOTE — Progress Notes (Signed)
Subjective:  HPI  Hypertension, follow-up:  BP Readings from Last 3 Encounters:  07/02/16 (!) 170/90  06/03/16 (!) 172/84  04/10/16 (!) 158/80    He was last seen for hypertension 4 weeks ago.  BP at that visit was 172/84. Management since that visit includes increased losartan to 100 mg daily. He reports good compliance with treatment. He is not having side effects.  Outside blood pressures are 160's/80's. He is experiencing fatigue.  Patient denies chest pain, chest pressure/discomfort, claudication, dyspnea, exertional chest pressure/discomfort, irregular heart beat, lower extremity edema, near-syncope, orthopnea, palpitations, paroxysmal nocturnal dyspnea, syncope and tachypnea.   Cardiovascular risk factors include advanced age (older than 41 for men, 73 for women), hypertension and male gender.  Wt Readings from Last 3 Encounters:  07/02/16 171 lb (77.6 kg)  06/03/16 173 lb (78.5 kg)  04/10/16 167 lb (75.8 kg)   ------------------------------------------------------------------------ Bronchitis- Pt reports that he is no longer coughing. Pt reports that he is weak and can not sleep. He reports that this started with his bronchitis and use of the antibiotics. Denies any shortness of breath or chest pain, dizziness or lightheadedness.   Prior to Admission medications   Medication Sig Start Date End Date Taking? Authorizing Provider  aspirin 81 MG tablet Take by mouth.    Historical Provider, MD  clonazePAM (KLONOPIN) 0.5 MG tablet Take by mouth. 03/16/13   Historical Provider, MD  doxycycline (VIBRA-TABS) 100 MG tablet Take 1 tablet (100 mg total) by mouth 2 (two) times daily. 06/03/16   Jerrol Banana., MD  hydrochlorothiazide (HYDRODIURIL) 25 MG tablet TAKE 1 TABLET BY MOUTH EVERY DAY 08/21/15   Jerrol Banana., MD  HYDROcodone-homatropine [facility]) 5-1.5 MG/5ML syrup Take 5 mLs by mouth every 8 (eight) hours as needed for cough. 06/18/16   Richard Maceo Pro.,  MD  levofloxacin (LEVAQUIN) 500 MG tablet Take 1 tablet (500 mg total) by mouth daily. 06/18/16   Richard Maceo Pro., MD  losartan (COZAAR) 100 MG tablet Take 1 tablet (100 mg total) by mouth daily. 06/03/16   Richard Maceo Pro., MD  meloxicam (MOBIC) 7.5 MG tablet Take 1 tablet (7.5 mg total) by mouth daily as needed for pain. 06/03/16   Richard Maceo Pro., MD  MULTIPLE VITAMIN PO Take by mouth.    Historical Provider, MD  naproxen (NAPROSYN) 500 MG tablet Take by mouth. 06/21/14   Historical Provider, MD  sildenafil (REVATIO) 20 MG tablet Take 1 tablet (20 mg total) by mouth daily as needed. Take 1-4 daily PRN sexual activity 06/03/16   Jerrol Banana., MD  Vitamin D, Ergocalciferol, (DRISDOL) 50000 units CAPS capsule TAKE 1 CAPSULE BY MOUTH WEEKLY 05/27/15   Birdie Sons, MD    Patient Active Problem List   Diagnosis Date Noted  . Adaptation reaction 12/21/2015  . Allergic rhinitis 12/21/2015  . Back pain, chronic 12/21/2015  . 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 Cervical disc disease 12/21/2015  . History of chicken pox 12/21/2015  . 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 Hernia, inguinal, unilateral 01/02/2009  . Avitaminosis D 09/12/2008  . Essential (primary) hypertension 05/28/2003    Past Medical History:  Diagnosis Date  . Hyperlipidemia   . Hypertension     Social History   Social History  . Marital status: Married    Spouse name: N/A  . Number of children: N/A  . Years of education: N/A   Occupational History  .  Not on file.   Social History Main Topics  . Smoking status: Never Smoker  . Smokeless tobacco: Never Used  . Alcohol use No  . Drug use: No  . Sexual activity: Not on file   Other Topics Concern  . Not on file   Social History Narrative  . No narrative on file    Allergies  Allergen Reactions  . Amlodipine Besylate     Insomnia    Review of Systems  Constitutional: Positive for malaise/fatigue.    HENT: Negative.   Eyes: Negative.   Respiratory: Negative.   Cardiovascular: Negative.   Gastrointestinal: Negative.   Genitourinary: Negative.   Musculoskeletal: Negative.   Skin: Negative.   Neurological: Positive for weakness.  Endo/Heme/Allergies: Negative.   Psychiatric/Behavioral: The patient has insomnia.     Immunization History  Administered Date(s) Administered  . Pneumococcal Conjugate-13 07/20/2014  . Tdap 04/08/2011  . Zoster 12/24/2011    Objective:  BP (!) 170/90 (BP Location: Left Arm, Patient Position: Sitting, Cuff Size: Normal)   Pulse 78   Temp 97.8 F (36.6 C) (Oral)   Resp 16   Wt 171 lb (77.6 kg)   SpO2 98%   BMI 25.25 kg/m   Physical Exam  Constitutional: He is oriented to person, place, and time and well-developed, well-nourished, and in no distress.  Eyes: Conjunctivae and EOM are normal. Pupils are equal, round, and reactive to light.  Neck: Normal range of motion. Neck supple.  Cardiovascular: Normal rate, regular rhythm, normal heart sounds and intact distal pulses.   Pulmonary/Chest: Effort normal and breath sounds normal.  Abdominal: Soft. Bowel sounds are normal.  Musculoskeletal: Normal range of motion.  Neurological: He is alert and oriented to person, place, and time. He has normal reflexes. Gait normal. GCS score is 15.  Skin: Skin is warm and dry.  Psychiatric: Mood, memory, affect and judgment normal.    Lab Results  Component Value Date   WBC 5.4 12/21/2015   HGB 13.5 03/05/2011   HCT 40.6 12/21/2015   PLT 183 12/21/2015   GLUCOSE 80 12/21/2015   CHOL 196 06/20/2014   TRIG 44 06/20/2014   HDL 96 (A) 06/20/2014   LDLCALC 91 06/20/2014   TSH 1.290 12/21/2015   PSA 5.2 10/20/2014    CMP     Component Value Date/Time   NA 142 12/21/2015 1117   K 3.8 12/21/2015 1117   CL 102 12/21/2015 1117   CO2 26 12/21/2015 1117   GLUCOSE 80 12/21/2015 1117   BUN 12 12/21/2015 1117   CREATININE 1.09 12/21/2015 1117   CALCIUM  9.3 12/21/2015 1117   PROT 7.1 12/21/2015 1117   ALBUMIN 4.2 12/21/2015 1117   AST 22 12/21/2015 1117   ALT 13 12/21/2015 1117   ALKPHOS 71 12/21/2015 1117   BILITOT 0.3 12/21/2015 1117   GFRNONAA 70 12/21/2015 1117   GFRAA 81 12/21/2015 1117    Assessment and Plan :  1. Essential (primary) hypertension Still elevated Will start Diltiazem and follow up in 1 month. Will need further work up if not improved.  - diltiazem (DILACOR XR) 180 MG 24 hr capsule; Take 1 capsule (180 mg total) by mouth daily.  Dispense: 30 capsule; Refill: 12 - CBC with Differential/Platelet - TSH  2. ED (erectile dysfunction) of organic origin   3. Other fatigue  - TSH - Comprehensive metabolic panel - Sedimentation rate  4. Neuropathy (HCC)  - Hemoglobin A1c - Vitamin B12  5. Generalized weakness Very nonspecific. -  ANA - CK   HPI, Exam, and A&P Transcribed under the direction and in the presence of Richard L. Cranford Mon, MD  Electronically Signed: Katina Dung, CMA I have done the exam and reviewed the above chart and it is accurate to the best of my knowledge. Development worker, community has been used in this note in any air is in the dictation or transcription are unintentional.  [facility] 07/02/2016 8:38 AM

## 2016-07-03 LAB — CBC WITH DIFFERENTIAL/PLATELET
BASOS ABS: 0 10*3/uL (ref 0.0–0.2)
Basos: 0 %
EOS (ABSOLUTE): 0.1 10*3/uL (ref 0.0–0.4)
Eos: 2 %
HEMATOCRIT: 40.7 % (ref 37.5–51.0)
HEMOGLOBIN: 13.5 g/dL (ref 13.0–17.7)
Immature Grans (Abs): 0 10*3/uL (ref 0.0–0.1)
Immature Granulocytes: 0 %
LYMPHS ABS: 2.3 10*3/uL (ref 0.7–3.1)
Lymphs: 44 %
MCH: 28.1 pg (ref 26.6–33.0)
MCHC: 33.2 g/dL (ref 31.5–35.7)
MCV: 85 fL (ref 79–97)
MONOS ABS: 0.4 10*3/uL (ref 0.1–0.9)
Monocytes: 8 %
NEUTROS ABS: 2.5 10*3/uL (ref 1.4–7.0)
Neutrophils: 46 %
Platelets: 212 10*3/uL (ref 150–379)
RBC: 4.8 x10E6/uL (ref 4.14–5.80)
RDW: 14.7 % (ref 12.3–15.4)
WBC: 5.3 10*3/uL (ref 3.4–10.8)

## 2016-07-03 LAB — COMPREHENSIVE METABOLIC PANEL
A/G RATIO: 1.6 (ref 1.2–2.2)
ALBUMIN: 4.3 g/dL (ref 3.6–4.8)
ALK PHOS: 86 IU/L (ref 39–117)
ALT: 20 IU/L (ref 0–44)
AST: 22 IU/L (ref 0–40)
BILIRUBIN TOTAL: 0.4 mg/dL (ref 0.0–1.2)
BUN / CREAT RATIO: 15 (ref 10–24)
BUN: 15 mg/dL (ref 8–27)
CHLORIDE: 105 mmol/L (ref 96–106)
CO2: 25 mmol/L (ref 18–29)
Calcium: 9.4 mg/dL (ref 8.6–10.2)
Creatinine, Ser: 1.02 mg/dL (ref 0.76–1.27)
GFR calc non Af Amer: 76 mL/min/{1.73_m2} (ref >59)
GFR, EST AFRICAN AMERICAN: 88 mL/min/{1.73_m2} (ref >59)
GLUCOSE: 96 mg/dL (ref 65–99)
Globulin, Total: 2.7 g/dL (ref 1.5–4.5)
POTASSIUM: 4.2 mmol/L (ref 3.5–5.2)
SODIUM: 145 mmol/L — AB (ref 134–144)
TOTAL PROTEIN: 7 g/dL (ref 6.0–8.5)

## 2016-07-03 LAB — TSH: TSH: 1.6 u[IU]/mL (ref 0.450–4.500)

## 2016-07-03 LAB — CK: Total CK: 271 U/L — ABNORMAL HIGH (ref 24–204)

## 2016-07-03 LAB — VITAMIN B12: Vitamin B-12: 1135 pg/mL (ref 232–1245)

## 2016-07-03 LAB — SEDIMENTATION RATE: Sed Rate: 11 mm/hr (ref 0–30)

## 2016-07-03 LAB — HEMOGLOBIN A1C
Est. average glucose Bld gHb Est-mCnc: 114 mg/dL
HEMOGLOBIN A1C: 5.6 % (ref 4.8–5.6)

## 2016-07-03 LAB — ANA: Anti Nuclear Antibody(ANA): NEGATIVE

## 2016-07-26 ENCOUNTER — Other Ambulatory Visit: Payer: Self-pay | Admitting: Family Medicine

## 2016-08-01 ENCOUNTER — Ambulatory Visit (INDEPENDENT_AMBULATORY_CARE_PROVIDER_SITE_OTHER): Payer: Commercial Managed Care - PPO | Admitting: Family Medicine

## 2016-08-01 VITALS — BP 152/74 | HR 58 | Resp 14 | Wt 168.0 lb

## 2016-08-01 DIAGNOSIS — N529 Male erectile dysfunction, unspecified: Secondary | ICD-10-CM

## 2016-08-01 DIAGNOSIS — I10 Essential (primary) hypertension: Secondary | ICD-10-CM

## 2016-08-01 MED ORDER — HYDRALAZINE HCL 25 MG PO TABS: 25.0000 mg | ORAL_TABLET | Freq: Two times a day (BID) | ORAL | 5 refills | Status: DC

## 2016-08-01 NOTE — Progress Notes (Signed)
Robert Patton  MRN: [id number] DOB: 11/10/1950  Subjective:  HPI  Patient is here for follow up on b/p. LOV was on 07/02/16 and patient was started on Diltiazem. Today when patient checked his b/p it was 160/82, yesterday 158/70, readings are staying around that. This medication has effected his ability to have an erection-makes this weaker. BP Readings from Last 3 Encounters:  08/01/16 (!) 152/74  07/02/16 (!) 170/90  06/03/16 (!) 172/84   Patient Active Problem List   Diagnosis Date Noted  . Adaptation reaction 12/21/2015  . Allergic rhinitis 12/21/2015  . Back pain, chronic 12/21/2015  . 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 Cervical disc disease 12/21/2015  . History of chicken pox 12/21/2015  . 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 Hernia, inguinal, unilateral 01/02/2009  . Avitaminosis D 09/12/2008  . Essential (primary) hypertension 05/28/2003    Past Medical History:  Diagnosis Date  . Hyperlipidemia   . Hypertension     Social History   Social History  . Marital status: Married    Spouse name: N/A  . Number of children: N/A  . Years of education: N/A   Occupational History  . Not on file.   Social History Main Topics  . Smoking status: Never Smoker  . Smokeless tobacco: Never Used  . Alcohol use No  . Drug use: No  . Sexual activity: Not on file   Other Topics Concern  . Not on file   Social History Narrative  . No narrative on file    Outpatient Encounter Prescriptions as of 08/01/2016  Medication Sig Note  . aspirin 81 MG tablet Take by mouth. 12/21/2015: Received from: [place]: ASPIRIN EC, 81MG  (Oral Tablet Delayed Release) - Historical Medication  1 daily (81 MG) Active  . clonazePAM (KLONOPIN) 0.5 MG tablet Take by mouth. 12/21/2015: Medication taken as needed.  Received from: [place]: CLONAZEPAM, 0.5MG  (Oral Tablet)  1/2 to one Tablet twice daily as  needed for nerves or sleep for 0 days  Quantity: 30;  Refills: 0   Ordered :16-Mar-2013    . diltiazem (DILACOR XR) 180 MG 24 hr capsule Take 1 capsule (180 mg total) by mouth daily.   . hydrochlorothiazide (HYDRODIURIL) 25 MG tablet TAKE 1 TABLET BY MOUTH EVERY DAY   . losartan (COZAAR) 100 MG tablet Take 1 tablet (100 mg total) by mouth daily.   . meloxicam (MOBIC) 7.5 MG tablet Take 1 tablet (7.5 mg total) by mouth daily as needed for pain.   Marland Kitchen MULTIPLE VITAMIN PO Take by mouth. 12/21/2015: Received from: [place]: MULTIVITAMINS (Oral Tablet) - Historical Medication  1 Daily Active  . naproxen (NAPROSYN) 500 MG tablet Take by mouth. 12/21/2015: Medication taken as needed.  Received from: [place]: NAPROXEN, 500MG  (Oral Tablet)  1 (one) Tablet two times daily as needed for back pain for 0 days  Quantity: 30;  Refills: 1   Ordered :21-Jun-2014  Fisher,  . sildenafil (REVATIO) 20 MG tablet Take 1 tablet (20 mg total) by mouth daily as needed. Take 1-4 daily PRN sexual activity   . Vitamin D, Ergocalciferol, (DRISDOL) 50000 units CAPS capsule TAKE 1 CAPSULE BY MOUTH WEEKLY   . [DISCONTINUED] doxycycline (VIBRA-TABS) 100 MG tablet Take 1 tablet (100 mg total) by mouth 2 (two) times daily. (Patient not taking: Reported on 07/02/2016)   . [DISCONTINUED] HYDROcodone-homatropine (HYCODAN) 5-1.5 MG/5ML syrup Take 5 mLs by  mouth every 8 (eight) hours as needed for cough. (Patient not taking: Reported on 07/02/2016)   . [DISCONTINUED] levofloxacin (LEVAQUIN) 500 MG tablet Take 1 tablet (500 mg total) by mouth daily. (Patient not taking: Reported on 07/02/2016)    No facility-administered encounter medications on file as of 08/01/2016.     Allergies  Allergen Reactions  . Amlodipine Besylate     Insomnia    Review of Systems  Constitutional: Negative.   Respiratory: Negative.   Cardiovascular: Negative.   Genitourinary:       Erection issue    Musculoskeletal: Positive for back pain and joint pain.       At times    Objective:  BP (!) 152/74   Pulse (!) 58   Resp 14   Wt 168 lb (76.2 kg)   BMI 24.81 kg/m   Physical Exam  Constitutional: He is oriented to person, place, and time and well-developed, well-nourished, and in no distress.  HENT:  Head: Normocephalic and atraumatic.  Right Ear: External ear normal.  Left Ear: External ear normal.  Nose: Nose normal.  Eyes: Conjunctivae are normal. Pupils are equal, round, and reactive to light.  Neck: Normal range of motion. Neck supple. No thyromegaly present.  Cardiovascular: Normal rate, regular rhythm, normal heart sounds and intact distal pulses.   No murmur heard. Pulmonary/Chest: Effort normal and breath sounds normal. No respiratory distress. He has no wheezes.  Abdominal: Soft.  Musculoskeletal: He exhibits no edema or tenderness.  Neurological: He is alert and oriented to person, place, and time.  Skin: Skin is warm and dry.  Psychiatric: Mood, memory, affect and judgment normal.    Assessment and Plan :  1. Essential (primary) hypertension Slightly better but not normal and patient is having side effects from Diltiazem. Switch to Hydralazine. Re check on the next visit.d/c Diltiazem.  2. ED (erectile dysfunction) of organic origin Libido normal per pt.Consider urology referrral.  HPI, Exam and A&P transcribed under direction and in the presence of Miguel Aschoff, MD.

## 2016-09-04 ENCOUNTER — Ambulatory Visit: Payer: Commercial Managed Care - PPO | Admitting: Family Medicine

## 2016-09-12 ENCOUNTER — Ambulatory Visit (INDEPENDENT_AMBULATORY_CARE_PROVIDER_SITE_OTHER): Payer: Commercial Managed Care - PPO | Admitting: Family Medicine

## 2016-09-12 VITALS — BP 178/88 | HR 60 | Temp 98.3°F | Resp 14 | Wt 162.0 lb

## 2016-09-12 DIAGNOSIS — R5383 Other fatigue: Secondary | ICD-10-CM | POA: Diagnosis not present

## 2016-09-12 DIAGNOSIS — I10 Essential (primary) hypertension: Secondary | ICD-10-CM | POA: Diagnosis not present

## 2016-09-12 MED ORDER — AMLODIPINE BESYLATE 5 MG PO TABS: 5.0000 mg | ORAL_TABLET | Freq: Every day | ORAL | 3 refills | Status: DC

## 2016-09-12 NOTE — Patient Instructions (Signed)
Add Amlodipine today. Take Amlodipine in the morning at this time. We are not stopping any medications at this time but may be able to stop some of the medications in the future if b/p is controlled.

## 2016-09-12 NOTE — Progress Notes (Signed)
Robert Patton  MRN: [id number] DOB: May 18, 1951  Subjective:  HPI  Patient is here for 1 month follow up. Diltiazem was switched to Hydralazine 2 times daily due to side effects from Diltiazem. He is tolerating Hydralazine but b/p is not better, he was taking medication 1 tablet daily until a week ago went to 2 tablets daily and has not seen a change. Readings have varied-200/90, 170/090. Dizziness present and head pressure is present, feels weak. No chest pain or tightness, no shortness of breath. BP Readings from Last 3 Encounters:  09/12/16 (!) 178/88  08/01/16 (!) 152/74  07/02/16 (!) 170/90   Patient Active Problem List   Diagnosis Date Noted  . Adaptation reaction 12/21/2015  . Allergic rhinitis 12/21/2015  . Back pain, chronic 12/21/2015  . 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 Cervical disc disease 12/21/2015  . History of chicken pox 12/21/2015  . 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 Hernia, inguinal, unilateral 01/02/2009  . Avitaminosis D 09/12/2008  . Essential (primary) hypertension 05/28/2003    Past Medical History:  Diagnosis Date  . Hyperlipidemia   . Hypertension     Social History   Social History  . Marital status: Married    Spouse name: N/A  . Number of children: N/A  . Years of education: N/A   Occupational History  . Not on file.   Social History Main Topics  . Smoking status: Never Smoker  . Smokeless tobacco: Never Used  . Alcohol use No  . Drug use: No  . Sexual activity: Not on file   Other Topics Concern  . Not on file   Social History Narrative  . No narrative on file    Outpatient Encounter Prescriptions as of 09/12/2016  Medication Sig Note  . aspirin 81 MG tablet Take by mouth. 12/21/2015: Received from: [place]: ASPIRIN EC, 81MG  (Oral Tablet Delayed Release) - Historical Medication  1 daily (81 MG) Active  . clonazePAM (KLONOPIN) 0.5 MG tablet Take by mouth.  12/21/2015: Medication taken as needed.  Received from: [place]: CLONAZEPAM, 0.5MG  (Oral Tablet)  1/2 to one Tablet twice daily as needed for nerves or sleep for 0 days  Quantity: 30;  Refills: 0   Ordered :16-Mar-2013    . hydrALAZINE (APRESOLINE) 25 MG tablet Take 1 tablet (25 mg total) by mouth 2 (two) times daily.   . hydrochlorothiazide (HYDRODIURIL) 25 MG tablet TAKE 1 TABLET BY MOUTH EVERY DAY   . losartan (COZAAR) 100 MG tablet Take 1 tablet (100 mg total) by mouth daily.   . meloxicam (MOBIC) 7.5 MG tablet Take 1 tablet (7.5 mg total) by mouth daily as needed for pain.   Marland Kitchen MULTIPLE VITAMIN PO Take by mouth. 12/21/2015: Received from: [place]: MULTIVITAMINS (Oral Tablet) - Historical Medication  1 Daily Active  . naproxen (NAPROSYN) 500 MG tablet Take by mouth. 12/21/2015: Medication taken as needed.  Received from: [place]: NAPROXEN, 500MG  (Oral Tablet)  1 (one) Tablet two times daily as needed for back pain for 0 days  Quantity: 30;  Refills: 1   Ordered :21-Jun-2014  [place],  . sildenafil (REVATIO) 20 MG tablet Take 1 tablet (20 mg total) by mouth daily as needed. Take 1-4 daily PRN sexual activity   . Vitamin D, Ergocalciferol, (DRISDOL) 50000 units CAPS capsule TAKE 1 CAPSULE BY MOUTH WEEKLY    No facility-administered encounter medications on file as of  09/12/2016.     Allergies  Allergen Reactions  . Amlodipine Besylate     Insomnia  . Diltiazem     Had issues sexually    Review of Systems  Constitutional: Positive for malaise/fatigue.  Respiratory: Negative.   Cardiovascular: Negative.   Musculoskeletal: Positive for back pain.  Neurological: Positive for dizziness and weakness.       Head pressure    Objective:  BP (!) 178/88   Pulse 60   Temp 98.3 F (36.8 C)   Resp 14   Wt 162 lb (73.5 kg)   SpO2 98%   BMI 23.92 kg/m   Physical Exam  Constitutional: He is  oriented to person, place, and time and well-developed, well-nourished, and in no distress.  HENT:  Head: Normocephalic and atraumatic.  Eyes: Conjunctivae are normal. Pupils are equal, round, and reactive to light.  Neck: Normal range of motion. Neck supple.  Cardiovascular: Normal rate, regular rhythm, normal heart sounds and intact distal pulses.   No murmur heard. Pulmonary/Chest: Effort normal and breath sounds normal. No respiratory distress. He has no wheezes.  Abdominal: Soft. Bowel sounds are normal. He exhibits no distension. There is no tenderness.  Neurological: He is alert and oriented to person, place, and time.   Assessment and Plan :  1. Essential (primary) hypertension Worse. Discussed with patient. Will re try Amlodipine again and advised patient to take it in the morning and see if that helps insomnia. Patient is agreeable. Will re check on the next visit. May have to refer patient to specialist to help with controlling b/p. Unfortunately pt has been very sensitive to possible med side effects.  2. Other fatigue  HPI, Exam and A&P transcribed by Theressa Millard, RMA under direction and in the presence of Miguel Aschoff, MD. I have done the exam and reviewed the chart and it is accurate to the best of my knowledge. Development worker, community has been used and  any errors in dictation or transcription are unintentional. Miguel Aschoff M.D. [facility]

## 2016-09-18 ENCOUNTER — Other Ambulatory Visit: Payer: Self-pay

## 2016-09-18 MED ORDER — AMLODIPINE BESYLATE 10 MG PO TABS: 10.0000 mg | ORAL_TABLET | Freq: Every day | ORAL | 3 refills | Status: DC

## 2016-09-30 ENCOUNTER — Encounter: Payer: Self-pay | Admitting: Family Medicine

## 2016-09-30 ENCOUNTER — Ambulatory Visit (INDEPENDENT_AMBULATORY_CARE_PROVIDER_SITE_OTHER): Payer: Commercial Managed Care - PPO | Admitting: Family Medicine

## 2016-09-30 VITALS — BP 134/82 | HR 68 | Temp 98.2°F | Resp 16 | Ht 69.0 in | Wt 164.0 lb

## 2016-09-30 DIAGNOSIS — I10 Essential (primary) hypertension: Secondary | ICD-10-CM | POA: Diagnosis not present

## 2016-09-30 NOTE — Patient Instructions (Addendum)
May discontinue hydralazine.

## 2016-09-30 NOTE — Progress Notes (Signed)
Patient: Robert Patton Male    DOB: 1950-12-20   66 y.o.   MRN: [id number] Visit Date: 09/30/2016  Today's Provider: [name], MD   Chief Complaint  Patient presents with  . Hypertension   Subjective:    HPI  Hypertension, follow-up:  BP Readings from Last 3 Encounters:  09/30/16 134/82  09/12/16 (!) 178/88  08/01/16 (!) 152/74    He was last seen for hypertension 3 weeks ago.  BP at that visit was 178/88. Management since that visit includes adding amlodipine 5mg . He reports good compliance with treatment. He is not having side effects.  He is not exercising. He is adherent to low salt diet.   Outside blood pressures are checked daily. Patient reports that BP averages in the 140s/80s. He is experiencing none.  Patient denies exertional chest pressure/discomfort, lower extremity edema and palpitations.    Weight trend: stable Wt Readings from Last 3 Encounters:  09/30/16 164 lb (74.4 kg)  09/12/16 162 lb (73.5 kg)  08/01/16 168 lb (76.2 kg)    Current diet: well balanced        Allergies  Allergen Reactions  . Amlodipine Besylate     Insomnia  . Diltiazem     Had issues sexually     Current Outpatient Prescriptions:  .  amLODipine (NORVASC) 10 MG tablet, Take 1 tablet (10 mg total) by mouth daily., Disp: 90 tablet, Rfl: 3 .  aspirin 81 MG tablet, Take by mouth., Disp: , Rfl:  .  clonazePAM (KLONOPIN) 0.5 MG tablet, Take by mouth., Disp: , Rfl:  .  hydrochlorothiazide (HYDRODIURIL) 25 MG tablet, TAKE 1 TABLET BY MOUTH EVERY DAY, Disp: 30 tablet, Rfl: 12 .  losartan (COZAAR) 100 MG tablet, Take 1 tablet (100 mg total) by mouth daily., Disp: 90 tablet, Rfl: 3 .  meloxicam (MOBIC) 7.5 MG tablet, Take 1 tablet (7.5 mg total) by mouth daily as needed for pain., Disp: 30 tablet, Rfl: 5 .  MULTIPLE VITAMIN PO, Take by mouth., Disp: , Rfl:  .  naproxen (NAPROSYN) 500 MG tablet, Take by mouth., Disp: , Rfl:  .  sildenafil (REVATIO) 20 MG tablet,  Take 1 tablet (20 mg total) by mouth daily as needed. Take 1-4 daily PRN sexual activity, Disp: 25 tablet, Rfl: 11 .  Vitamin D, Ergocalciferol, (DRISDOL) 50000 units CAPS capsule, TAKE 1 CAPSULE BY MOUTH WEEKLY, Disp: 12 capsule, Rfl: 4 .  hydrALAZINE (APRESOLINE) 25 MG tablet, Take 1 tablet (25 mg total) by mouth 2 (two) times daily. (Patient not taking: Reported on 09/30/2016), Disp: 60 tablet, Rfl: 5  Review of Systems  Constitutional: Negative.   HENT: Negative.   Eyes: Negative.   Respiratory: Negative.   Cardiovascular: Negative.   Endocrine: Negative.   Musculoskeletal: Negative.   Allergic/Immunologic: Negative.   Neurological: Positive for dizziness.  Hematological: Negative.   Psychiatric/Behavioral: Negative.     Social History  Substance Use Topics  . Smoking status: Never Smoker  . Smokeless tobacco: Never Used  . Alcohol use No   Objective:   BP 134/82 (BP Location: Right Arm, Patient Position: Sitting, Cuff Size: Normal)   Pulse 68   Temp 98.2 F (36.8 C)   Resp 16   Ht 5\' 9"  (1.753 m)   Wt 164 lb (74.4 kg)   SpO2 98%   BMI 24.22 kg/m  Vitals:   09/30/16 0821  BP: 134/82  Pulse: 68  Resp: 16  Temp: 98.2 F (36.8 C)  SpO2: 98%  Weight: 164 lb (74.4 kg)  Height: 5\' 9"  (1.753 m)     Physical Exam  Constitutional: He is oriented to person, place, and time. He appears well-developed and well-nourished.  HENT:  Head: Normocephalic and atraumatic.  Right Ear: External ear normal.  Left Ear: External ear normal.  Nose: Nose normal.  Eyes: Conjunctivae are normal.  Neck: Neck supple. No thyromegaly present.  Cardiovascular: Normal rate, regular rhythm and normal heart sounds.   Pulmonary/Chest: Effort normal and breath sounds normal.  Abdominal: Soft.  Musculoskeletal: Normal range of motion. He exhibits no edema.  Neurological: He is alert and oriented to person, place, and time.  Skin: Skin is warm and dry.  Psychiatric: He has a normal mood and  affect. His behavior is normal. Judgment and thought content normal.        Assessment & Plan:     1. Essential (primary) hypertension Improving. Will discontinue hydralazine 25mg  due to causing dizziness. Patient will F/U in 4-95mos for CPE.        I have done the exam and reviewed the above chart and it is accurate to the best of my knowledge. Development worker, community has been used in this note in any air is in the dictation or transcription are unintentional.  [name], MD  [place]

## 2016-10-01 ENCOUNTER — Ambulatory Visit: Payer: Commercial Managed Care - PPO | Admitting: Family Medicine

## 2016-10-04 ENCOUNTER — Emergency Department: Payer: Commercial Managed Care - PPO

## 2016-10-04 ENCOUNTER — Emergency Department
Admission: EM | Admit: 2016-10-04 | Discharge: 2016-10-04 | Disposition: A | Discharge status: Home / Self Care | Payer: Commercial Managed Care - PPO | Attending: Emergency Medicine | Admitting: Emergency Medicine

## 2016-10-04 DIAGNOSIS — R42 Dizziness and giddiness: Secondary | ICD-10-CM | POA: Insufficient documentation

## 2016-10-04 DIAGNOSIS — I10 Essential (primary) hypertension: Secondary | ICD-10-CM | POA: Insufficient documentation

## 2016-10-04 DIAGNOSIS — Z79899 Other long term (current) drug therapy: Secondary | ICD-10-CM | POA: Insufficient documentation

## 2016-10-04 DIAGNOSIS — Z7982 Long term (current) use of aspirin: Secondary | ICD-10-CM | POA: Insufficient documentation

## 2016-10-04 LAB — COMPREHENSIVE METABOLIC PANEL
ALT: 32 U/L (ref 17–63)
AST: 30 U/L (ref 15–41)
Albumin: 4.1 g/dL (ref 3.5–5.0)
Alkaline Phosphatase: 82 U/L (ref 38–126)
Anion gap: 6 (ref 5–15)
BILIRUBIN TOTAL: 0.4 mg/dL (ref 0.3–1.2)
BUN: 14 mg/dL (ref 6–20)
CALCIUM: 9.1 mg/dL (ref 8.9–10.3)
CO2: 29 mmol/L (ref 22–32)
CREATININE: 1.01 mg/dL (ref 0.61–1.24)
Chloride: 103 mmol/L (ref 101–111)
GFR calc Af Amer: >60 mL/min (ref >60)
GLUCOSE: 93 mg/dL (ref 65–99)
Potassium: 3.7 mmol/L (ref 3.5–5.1)
Sodium: 138 mmol/L (ref 135–145)
Total Protein: 7.4 g/dL (ref 6.5–8.1)

## 2016-10-04 LAB — CBC
HEMATOCRIT: 39.7 % — AB (ref 40.0–52.0)
Hemoglobin: 13.5 g/dL (ref 13.0–18.0)
MCH: 28.6 pg (ref 26.0–34.0)
MCHC: 34.1 g/dL (ref 32.0–36.0)
MCV: 83.8 fL (ref 80.0–100.0)
PLATELETS: 169 10*3/uL (ref 150–440)
RBC: 4.74 MIL/uL (ref 4.40–5.90)
RDW: 14.5 % (ref 11.5–14.5)
WBC: 4.4 10*3/uL (ref 3.8–10.6)

## 2016-10-04 LAB — TROPONIN I

## 2016-10-04 MED ORDER — MECLIZINE HCL 25 MG PO TABS
25.0000 mg | ORAL_TABLET | Freq: Once | ORAL | Status: AC
  Administered 2016-10-04: 25 mg via ORAL
  Filled 2016-10-04: qty 1

## 2016-10-04 MED ORDER — MECLIZINE HCL 32 MG PO TABS: 32.0000 mg | ORAL_TABLET | Freq: Three times a day (TID) | ORAL | 0 refills | Status: DC | PRN

## 2016-10-04 NOTE — ED Triage Notes (Signed)
Pt in with co dizziness since yest, denies any pain. States has had similar symptoms in the past and was told it was due to hypertension, pt also co nausea.

## 2016-10-04 NOTE — ED Notes (Signed)
Dr. Brown at the bedside for pt evaluation 

## 2016-10-04 NOTE — ED Provider Notes (Signed)
[facility] Emergency Department Provider Note  Time seen: 2:53 AM.  I have reviewed the triage vital signs and the nursing notes.   HISTORY  Chief Complaint Dizziness    HPI Robert Patton is a 66 y.o. male with below list of chronic medical conditions presents to the emergency department with intermittent dizziness times "several weeks". Patient states acute onset dizziness tonight after he left work which began abruptly. Patient describes it as "spinning" Patient denies any headache no weakness numbness gait instability. Patient denies any chest pain no shortness of breath. Patient does admit to "pressure" in bilateral ears.   Past Medical History:  Diagnosis Date  . Hyperlipidemia   . Hypertension     Patient Active Problem List   Diagnosis Date Noted  . Adaptation reaction 12/21/2015  . Allergic rhinitis 12/21/2015  . Back pain, chronic 12/21/2015  . 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 Cervical disc disease 12/21/2015  . History of chicken pox 12/21/2015  . 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 Hernia, inguinal, unilateral 01/02/2009  . Avitaminosis D 09/12/2008  . Essential (primary) hypertension 05/28/2003    Past Surgical History:  Procedure Laterality Date  . COLONOSCOPY  08/15/2009  . NO PAST SURGERIES      Prior to Admission medications   Medication Sig Start Date End Date Taking? Authorizing Provider  amLODipine (NORVASC) 10 MG tablet Take 1 tablet (10 mg total) by mouth daily. 09/18/16   Jerrol Banana., MD  aspirin 81 MG tablet Take by mouth.    [provider]  clonazePAM (KLONOPIN) 0.5 MG tablet Take by mouth. 03/16/13   [provider]  hydrochlorothiazide (HYDRODIURIL) 25 MG tablet TAKE 1 TABLET BY MOUTH EVERY DAY 08/21/15   Jerrol Banana., MD  losartan (COZAAR) 100 MG tablet Take 1 tablet (100 mg total) by mouth daily. 06/03/16   Jerrol Banana., MD  meloxicam  (MOBIC) 7.5 MG tablet Take 1 tablet (7.5 mg total) by mouth daily as needed for pain. 06/03/16   Jerrol Banana., MD  MULTIPLE VITAMIN PO Take by mouth.    [provider]  naproxen (NAPROSYN) 500 MG tablet Take by mouth. 06/21/14   [provider]  sildenafil (REVATIO) 20 MG tablet Take 1 tablet (20 mg total) by mouth daily as needed. Take 1-4 daily PRN sexual activity 06/03/16   Jerrol Banana., MD  Vitamin D, Ergocalciferol, (DRISDOL) 50000 units CAPS capsule TAKE 1 CAPSULE BY MOUTH WEEKLY 07/26/16   Birdie Sons, MD    Allergies Amlodipine besylate and Diltiazem  Family History  Problem Relation Age of Onset  . Hypertension Brother     Social History Social History  Substance Use Topics  . Smoking status: Never Smoker  . Smokeless tobacco: Never Used  . Alcohol use No    Review of Systems Constitutional: No fever/chills Eyes: No visual changes. ENT: No sore throat. Cardiovascular: Denies chest pain. Respiratory: Denies shortness of breath. Gastrointestinal: No abdominal pain.  No nausea, no vomiting.  No diarrhea.  No constipation. Genitourinary: Negative for dysuria. Musculoskeletal: Negative for back pain. Integumentary: Negative for rash. Neurological: Negative for headaches, focal weakness or numbness.Positive for dizziness   ____________________________________________   PHYSICAL EXAM:  VITAL SIGNS: ED Triage Vitals  Enc Vitals Group     BP 10/04/16 0140 (!) 155/89     Pulse Rate 10/04/16 0140 74     Resp 10/04/16 0140 18  Temp 10/04/16 0141 98.3 F (36.8 C)     Temp Source 10/04/16 0141 Oral     SpO2 10/04/16 0141 100 %     Weight 10/04/16 0141 164 lb (74.4 kg)     Height 10/04/16 0141 5\' 10"  (1.778 m)     Head Circumference --      Peak Flow --      Pain Score --      Pain Loc --      Pain Edu? --      Excl. in [place]? --     Constitutional: Alert and oriented. Well appearing and in no acute distress. Eyes:  Conjunctivae are normal. PERRL. EOMI. Head: Atraumatic. Ears:  Healthy appearing ear canals and TMs bilaterally Nose: No congestion/rhinnorhea. Mouth/Throat: Mucous membranes are moist. Oropharynx non-erythematous. Neck: No stridor.  Cardiovascular: Normal rate, regular rhythm. Good peripheral circulation. Grossly normal heart sounds. Respiratory: Normal respiratory effort.  No retractions. Lungs CTAB. Gastrointestinal: Soft and nontender. No distention.  Musculoskeletal: No lower extremity tenderness nor edema. No gross deformities of extremities. Neurologic:  Normal speech and language. No gross focal neurologic deficits are appreciated.  Skin:  Skin is warm, dry and intact. No rash noted. Psychiatric: Mood and affect are normal. Speech and behavior are normal.  ____________________________________________   LABS (all labs ordered are listed, but only abnormal results are displayed)  Labs Reviewed  CBC - Abnormal; Notable for the following:       Result Value   HCT 39.7 (*)    All other components within normal limits  COMPREHENSIVE METABOLIC PANEL  TROPONIN I   ____________________________________________  EKG  ED ECG REPORT I, Frederika N Monzerat Handler, the attending physician, personally viewed and interpreted this ECG.   Date: 10/04/2016  EKG Time: 1:50 AM  Rate: 71  Rhythm: Normal sinus rhythm  Axis: Normal  Intervals:\Normal  ST&T Change: None  ____________________________________________  RADIOLOGY I, [place] Aaminah Forrester, personally viewed and evaluated these images (plain radiographs) as part of my medical decision making, as well as reviewing the written report by the radiologist.  Ct Head Wo Contrast  Result Date: 10/04/2016 CLINICAL DATA:  Dizziness EXAM: CT HEAD WITHOUT CONTRAST TECHNIQUE: Contiguous axial images were obtained from the base of the skull through the vertex without intravenous contrast. COMPARISON:  None. FINDINGS: Brain: No evidence of acute  infarction, hemorrhage, hydrocephalus, extra-axial collection or mass lesion/mass effect. Vascular: No hyperdense vessel or unexpected calcification. Skull: Normal. Negative for fracture or focal lesion. Sinuses/Orbits: No acute finding. Other: None. IMPRESSION: No acute intracranial abnormalities. Electronically Signed   By: Lucienne Capers M.D.   On: 10/04/2016 02:47     Procedures   ____________________________________________   INITIAL IMPRESSION / ASSESSMENT AND PLAN / ED COURSE  Pertinent labs & imaging results that were available during my care of the patient were reviewed by me and considered in my medical decision making (see chart for details).  66 year old male presenting with intermittent dizziness abrupt onset tonight. Patient describes it as spinning. Suspect peripheral vertigo as the patient's etiology for dizziness. CT scan of the head unremarkable auditory data likewise      ____________________________________________  FINAL CLINICAL IMPRESSION(S) / ED DIAGNOSES  Final diagnoses:  Vertigo     MEDICATIONS GIVEN DURING THIS VISIT:  Medications  meclizine (ANTIVERT) tablet 25 mg (25 mg Oral Given 10/04/16 0356)     NEW OUTPATIENT MEDICATIONS STARTED DURING THIS VISIT:  New Prescriptions   No medications on file    Modified Medications  No medications on file    Discontinued Medications   No medications on file     Note:  This document was prepared using Dragon voice recognition software and may include unintentional dictation errors.    Gregor Hams, MD 10/04/16 0500

## 2016-10-10 ENCOUNTER — Ambulatory Visit (INDEPENDENT_AMBULATORY_CARE_PROVIDER_SITE_OTHER): Payer: Commercial Managed Care - PPO | Admitting: Family Medicine

## 2016-10-10 ENCOUNTER — Encounter: Payer: Self-pay | Admitting: Family Medicine

## 2016-10-10 ENCOUNTER — Ambulatory Visit: Payer: Commercial Managed Care - PPO | Admitting: Family Medicine

## 2016-10-10 VITALS — BP 110/64 | HR 89 | Temp 98.2°F | Resp 17 | Wt 163.4 lb

## 2016-10-10 DIAGNOSIS — R42 Dizziness and giddiness: Secondary | ICD-10-CM

## 2016-10-10 DIAGNOSIS — J309 Allergic rhinitis, unspecified: Secondary | ICD-10-CM

## 2016-10-10 DIAGNOSIS — R600 Localized edema: Secondary | ICD-10-CM

## 2016-10-10 MED ORDER — FLUTICASONE PROPIONATE 50 MCG/ACT NA SUSP: 2.0000 | Freq: Every day | NASAL | 1 refills | Status: DC

## 2016-10-10 NOTE — Patient Instructions (Addendum)
Decrease amlodipine to 5 mg daily and see if swelling goes away over the next few days. If not, stop it altogether. Give the steroid nasal spray a few days to work.

## 2016-10-10 NOTE — Progress Notes (Signed)
Subjective:     Patient ID: Robert Patton, male   DOB: January 09, 1951, 66 y.o.   MRN: [id number]  HPI  Chief Complaint  Patient presents with  . Leg Swelling    Patient comes in office today with concerns of swelling of both feet for the past 2-3 weeks. Patient states that he experiences swelling daily and not his feet appear to be hot to the touch  . Ear Pain    Patient reports bilateral ear pain and sinus pressure( head) for over a week. Patient states that he has been feeling dizzy and off balance, he reports wife took him to ER and all testing was normal. Patient states that ER doctor referred him to ENT but appt is not until 10/31/16.   ER records reviewed from 5/11. Felt to have positional vertigo with normal CT scan and labs. States foot swelling goes down at night. Has hx of allergies but no sinus drainage. States meclizine does not help his sx.   Review of Systems     Objective:   Physical Exam  Constitutional: He appears well-developed and well-nourished. No distress.  HENT:  TM's without inflammation, no tonsillar enlargement   Cardiovascular:  Pulses:      Dorsalis pedis pulses are 2+ on the right side, and 2+ on the left side.       Posterior tibial pulses are 2+ on the right side, and 2+ on the left side.  Musculoskeletal: He exhibits edema (1+ ankle edema bilaterally).  Lymphadenopathy:    He has no cervical adenopathy.       Assessment:    1. Vertigo: ? Allergy mediated - fluticasone (FLONASE) 50 MCG/ACT nasal spray; Place 2 sprays into both nostrils daily.  Dispense: 16 g; Refill: 1  2. Pedal edema: ? Secondary to Ca++ channel blocker  3. Allergic rhinitis, unspecified seasonality, unspecified trigger - fluticasone (FLONASE) 50 MCG/ACT nasal spray; Place 2 sprays into both nostrils daily.  Dispense: 16 g; Refill: 1    Plan:    Decrease amlodipine to 5 mg. For a few days. If swelling not improving to stop altogether. Keep appointment with ENT. F/u with primary M.D.,  Dr. Rosanna Randy in one week.

## 2016-10-14 ENCOUNTER — Ambulatory Visit: Payer: Commercial Managed Care - PPO | Admitting: Family Medicine

## 2016-10-17 ENCOUNTER — Ambulatory Visit: Payer: Commercial Managed Care - PPO | Admitting: Family Medicine

## 2017-02-04 ENCOUNTER — Ambulatory Visit (INDEPENDENT_AMBULATORY_CARE_PROVIDER_SITE_OTHER): Payer: Commercial Managed Care - PPO | Admitting: Family Medicine

## 2017-02-04 VITALS — BP 182/74 | HR 54 | Temp 98.1°F | Resp 16 | Ht 70.0 in | Wt 165.0 lb

## 2017-02-04 DIAGNOSIS — I10 Essential (primary) hypertension: Secondary | ICD-10-CM

## 2017-02-04 DIAGNOSIS — Z0000 Encounter for general adult medical examination without abnormal findings: Secondary | ICD-10-CM

## 2017-02-04 DIAGNOSIS — K219 Gastro-esophageal reflux disease without esophagitis: Secondary | ICD-10-CM | POA: Diagnosis not present

## 2017-02-04 DIAGNOSIS — Z1211 Encounter for screening for malignant neoplasm of colon: Secondary | ICD-10-CM | POA: Diagnosis not present

## 2017-02-04 DIAGNOSIS — R972 Elevated prostate specific antigen [PSA]: Secondary | ICD-10-CM | POA: Diagnosis not present

## 2017-02-04 LAB — IFOBT (OCCULT BLOOD): IFOBT: NEGATIVE

## 2017-02-04 LAB — COMPLETE METABOLIC PANEL WITH GFR
AG RATIO: 1.5 (calc) (ref 1.0–2.5)
ALBUMIN MSPROF: 4.1 g/dL (ref 3.6–5.1)
ALT: 14 U/L (ref 9–46)
AST: 17 U/L (ref 10–35)
Alkaline phosphatase (APISO): 77 U/L (ref 40–115)
BUN: 16 mg/dL (ref 7–25)
CALCIUM: 9 mg/dL (ref 8.6–10.3)
CO2: 27 mmol/L (ref 20–32)
Chloride: 105 mmol/L (ref 98–110)
Creat: 0.88 mg/dL (ref 0.70–1.25)
GFR, EST AFRICAN AMERICAN: 103 mL/min/{1.73_m2} (ref >=60)
GFR, EST NON AFRICAN AMERICAN: 89 mL/min/{1.73_m2} (ref >=60)
GLOBULIN: 2.7 g/dL (ref 1.9–3.7)
Glucose, Bld: 89 mg/dL (ref 65–99)
Potassium: 3.9 mmol/L (ref 3.5–5.3)
SODIUM: 139 mmol/L (ref 135–146)
Total Bilirubin: 0.5 mg/dL (ref 0.2–1.2)
Total Protein: 6.8 g/dL (ref 6.1–8.1)

## 2017-02-04 LAB — CBC WITH DIFFERENTIAL/PLATELET
BASOS PCT: 0.6 %
Basophils Absolute: 28 cells/uL (ref 0–200)
Eosinophils Absolute: 230 cells/uL (ref 15–500)
Eosinophils Relative: 4.9 %
HCT: 41.2 % (ref 38.5–50.0)
HEMOGLOBIN: 13.7 g/dL (ref 13.2–17.1)
Lymphs Abs: 1974 cells/uL (ref 850–3900)
MCH: 28.2 pg (ref 27.0–33.0)
MCHC: 33.3 g/dL (ref 32.0–36.0)
MCV: 84.9 fL (ref 80.0–100.0)
MONOS PCT: 8.1 %
MPV: 10.5 fL (ref 7.5–12.5)
NEUTROS ABS: 2087 {cells}/uL (ref 1500–7800)
Neutrophils Relative %: 44.4 %
PLATELETS: 182 10*3/uL (ref 140–400)
RBC: 4.85 10*6/uL (ref 4.20–5.80)
RDW: 13.1 % (ref 11.0–15.0)
Total Lymphocyte: 42 %
WBC: 4.7 10*3/uL (ref 3.8–10.8)
WBCMIX: 381 {cells}/uL (ref 200–950)

## 2017-02-04 LAB — PSA: PSA: 6.1 ng/mL — ABNORMAL HIGH (ref <=4.0)

## 2017-02-04 LAB — LIPID PANEL
CHOLESTEROL: 163 mg/dL (ref <200)
HDL: 79 mg/dL (ref >40)
LDL Cholesterol (Calc): 71 mg/dL (calc)
Non-HDL Cholesterol (Calc): 84 mg/dL (calc) (ref <130)
TRIGLYCERIDES: 57 mg/dL (ref <150)
Total CHOL/HDL Ratio: 2.1 (calc) (ref <5.0)

## 2017-02-04 LAB — TSH: TSH: 1.61 m[IU]/L (ref 0.40–4.50)

## 2017-02-04 MED ORDER — RANITIDINE HCL 150 MG PO CAPS: 150.0000 mg | ORAL_CAPSULE | Freq: Two times a day (BID) | ORAL | 12 refills | Status: DC

## 2017-02-04 MED ORDER — HYDROCHLOROTHIAZIDE 25 MG PO TABS: 25.0000 mg | ORAL_TABLET | Freq: Every day | ORAL | 3 refills | Status: DC

## 2017-02-04 NOTE — Progress Notes (Signed)
Patient: Robert Patton, Male    DOB: March 20, 1951, 66 y.o.   MRN: [id number] Visit Date: 02/04/2017  Today's Provider: [name], MD   Chief Complaint  Patient presents with  . Annual Exam   Subjective:   Robert Patton is a 66 y.o. male who presents today for his Subsequent Annual Wellness Visit. He feels well. He reports exercising daily. He reports he is sleeping well.  Review of Systems  Constitutional: Negative.   HENT: Negative.   Eyes: Negative.   Respiratory: Positive for cough.   Cardiovascular: Negative.   Gastrointestinal: Negative.   Endocrine: Negative.   Genitourinary: Negative.   Musculoskeletal: Negative.   Skin: Negative.   Allergic/Immunologic: Negative.   Neurological: Negative.   Hematological: Negative.   Psychiatric/Behavioral: Negative.     Patient Active Problem List   Diagnosis Date Noted  . Adaptation reaction 12/21/2015  . Allergic rhinitis 12/21/2015  . Back pain, chronic 12/21/2015  . 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 Cervical disc disease 12/21/2015  . History of chicken pox 12/21/2015  . 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 Hernia, inguinal, unilateral 01/02/2009  . Avitaminosis D 09/12/2008  . Essential (primary) hypertension 05/28/2003    Social History   Social History  . Marital status: Married    Spouse name: N/A  . Number of children: N/A  . Years of education: N/A   Occupational History  . Not on file.   Social History Main Topics  . Smoking status: Never Smoker  . Smokeless tobacco: Never Used  . Alcohol use No  . Drug use: No  . Sexual activity: Not on file   Other Topics Concern  . Not on file   Social History Narrative  . No narrative on file    Past Surgical History:  Procedure Laterality Date  . COLONOSCOPY  08/15/2009  . NO PAST SURGERIES      His family history includes Hypertension in his brother.     Outpatient Encounter Prescriptions as of 02/04/2017  Medication  Sig Note  . aspirin 81 MG tablet Take by mouth. 12/21/2015: Received from: [place]: ASPIRIN EC, 81MG  (Oral Tablet Delayed Release) - Historical Medication  1 daily (81 MG) Active  . fluticasone (FLONASE) 50 MCG/ACT nasal spray Place 2 sprays into both nostrils daily.   Marland Kitchen losartan (COZAAR) 100 MG tablet Take 1 tablet (100 mg total) by mouth daily.   . meclizine (ANTIVERT) 25 MG tablet TK 1 T PO  TID PRN   . meloxicam (MOBIC) 7.5 MG tablet Take 1 tablet (7.5 mg total) by mouth daily as needed for pain.   Marland Kitchen MULTIPLE VITAMIN PO Take by mouth. 12/21/2015: Received from: [place]: MULTIVITAMINS (Oral Tablet) - Historical Medication  1 Daily Active  . Vitamin D, Ergocalciferol, (DRISDOL) 50000 units CAPS capsule TAKE 1 CAPSULE BY MOUTH WEEKLY   . [DISCONTINUED] amLODipine (NORVASC) 10 MG tablet Take 1 tablet (10 mg total) by mouth daily.   . [DISCONTINUED] naproxen (NAPROSYN) 500 MG tablet Take by mouth. 12/21/2015: Medication taken as needed.  Received from: [place]: NAPROXEN, 500MG  (Oral Tablet)  1 (one) Tablet two times daily as needed for back pain for 0 days  Quantity: 30;  Refills: 1   Ordered :21-Jun-2014  [place],   No facility-administered encounter medications on file as of 02/04/2017.     Allergies  Allergen Reactions  . Amlodipine Besylate     Insomnia  .  Diltiazem     Had issues sexually    Patient Care Team: Jerrol Banana., MD as PCP - General (Family Medicine) Jerrol Banana., MD (Family Medicine) Christene Lye, MD (General Surgery)   Objective:   Vitals:  Vitals:   02/04/17 0849  BP: (!) 182/74  Pulse: (!) 54  Resp: 16  Temp: 98.1 F (36.7 C)  TempSrc: Oral  Weight: 165 lb (74.8 kg)  Height: 5\' 10"  (1.778 m)    Physical Exam  Constitutional: He is oriented to person, place, and time. He appears well-developed and well-nourished.  HENT:  Head:  Normocephalic and atraumatic.  Right Ear: External ear normal.  Left Ear: External ear normal.  Nose: Nose normal.  Mouth/Throat: Oropharynx is clear and moist.  Old growth right side of tongue.  Eyes: Pupils are equal, round, and reactive to light. Conjunctivae and EOM are normal.  Neck: Normal range of motion. Neck supple.  Cardiovascular: Normal rate, regular rhythm, normal heart sounds and intact distal pulses.   Pulmonary/Chest: Effort normal and breath sounds normal.  Abdominal: Soft. Bowel sounds are normal.  Genitourinary: Rectum normal, prostate normal and penis normal.  Genitourinary Comments: Right inguinal hernia.  Musculoskeletal: Normal range of motion.  Neurological: He is alert and oriented to person, place, and time.  Skin: Skin is warm and dry.  Psychiatric: He has a normal mood and affect. His behavior is normal. Judgment and thought content normal.    Activities of Daily Living In your present state of health, do you have any difficulty performing the following activities: 02/04/2017  Hearing? N  Vision? N  Difficulty concentrating or making decisions? N  Walking or climbing stairs? N  Dressing or bathing? N  Doing errands, shopping? N  Some recent data might be hidden    Fall Risk Assessment Fall Risk  02/04/2017 12/21/2015  Falls in the past year? No No     Depression Screen PHQ 2/9 Scores 02/04/2017 12/21/2015  PHQ - 2 Score 0 0  PHQ- 9 Score 0 -   6CIT Screen 02/04/2017  What Year? 0 points  What month? 0 points  What time? 0 points  Count back from 20 0 points  Months in reverse 0 points  Repeat phrase 10 points  Total Score 10    Assessment & Plan:     Annual Wellness Visit  Reviewed patient's Family Medical History Reviewed and updated list of patient's medical providers Assessment of cognitive impairment was done Assessed patient's functional ability Established a written schedule for health screening [place]  Completed and Reviewed  Exercise Activities and Dietary recommendations Goals    None      Immunization History  Administered Date(s) Administered  . Pneumococcal Conjugate-13 07/20/2014  . Tdap 04/08/2011  . Zoster 12/24/2011    Health Maintenance  Topic Date Due  . PNA vac Low Risk Adult (2 of 2 - PPSV23) 07/21/2015  . INFLUENZA VACCINE  04/10/2017 (Originally 12/25/2016)  . COLONOSCOPY  08/16/2019  . TETANUS/TDAP  04/07/2021  . Hepatitis C Screening  Completed     Discussed health benefits of physical activity, and encouraged him to engage in regular exercise appropriate for his age and condition.  Right Inguinal Hernia Refer to surgery. GERD Try Zantac BID. HTN   I have done the exam and reviewed the chart and it is accurate to the best of my knowledge. Development worker, community has been used and  any errors in dictation or transcription are  unintentional. Miguel Aschoff M.D. [facility]

## 2017-02-05 ENCOUNTER — Other Ambulatory Visit: Payer: Self-pay | Admitting: Emergency Medicine

## 2017-02-05 DIAGNOSIS — R972 Elevated prostate specific antigen [PSA]: Secondary | ICD-10-CM

## 2017-02-12 ENCOUNTER — Encounter: Payer: Self-pay | Admitting: Family Medicine

## 2017-04-08 ENCOUNTER — Ambulatory Visit: Payer: Self-pay | Admitting: Family Medicine

## 2017-04-09 ENCOUNTER — Encounter: Payer: Commercial Managed Care - PPO | Admitting: Family Medicine

## 2017-04-14 ENCOUNTER — Telehealth: Payer: Self-pay | Admitting: Family Medicine

## 2017-04-14 NOTE — Telephone Encounter (Addendum)
Pt contacted office for refill request on the following medications:  Sildenafil 20MG -Pt states he has has this in the past.  Pt would like to pick this up.  CB#[phone]/MW

## 2017-04-14 NOTE — Telephone Encounter (Signed)
Ok to refill? Please advise. Thanks!  

## 2017-04-15 MED ORDER — SILDENAFIL CITRATE 20 MG PO TABS: ORAL_TABLET | ORAL | 11 refills | Status: DC

## 2017-04-15 NOTE — Telephone Encounter (Signed)
Pt called back asking about Rx. Thanks TNP

## 2017-04-15 NOTE — Telephone Encounter (Signed)
ok 

## 2017-04-15 NOTE — Telephone Encounter (Signed)
Pt is requesting the Rx sent to [facility] [place], [place] [phone].  CB#[phone]/MW

## 2017-04-15 NOTE — Telephone Encounter (Signed)
rx called in to pharmacy listed below-Anastasiya V Hopkins, RMA

## 2017-04-15 NOTE — Telephone Encounter (Signed)
Pt is requesting the Rx sent to [facility], [place] [phone].  CB#[phone]/MW

## 2017-04-16 NOTE — Telephone Encounter (Signed)
RX was not sent to [facility], RX was called in to [facility], [place]

## 2017-04-16 NOTE — Telephone Encounter (Signed)
Pt called asking why the Rx for sildenafil (REVATIO) 20 MG tablet  was sent to [facility].  Pt is requesting the Rx sent to [facility] [place], [place] [phone]. CB#[phone]/MW

## 2017-05-07 ENCOUNTER — Telehealth: Payer: Self-pay | Admitting: Family Medicine

## 2017-05-07 NOTE — Telephone Encounter (Signed)
OV with someone.

## 2017-05-07 NOTE — Telephone Encounter (Signed)
Pt states he has had cough and congest for 2 days.  Pt is requesting a Rx to help with his cough.    [place].  [phone]

## 2017-05-07 NOTE — Telephone Encounter (Signed)
Please review. Appointment?-Locklyn Henriquez Estell Harpin, RMA

## 2017-05-07 NOTE — Telephone Encounter (Signed)
Pt advised-Anastasiya V Hopkins, RMA  

## 2017-05-13 ENCOUNTER — Ambulatory Visit: Payer: Commercial Managed Care - PPO | Admitting: Family Medicine

## 2017-05-14 ENCOUNTER — Encounter: Payer: Self-pay | Admitting: Physician Assistant

## 2017-05-14 ENCOUNTER — Ambulatory Visit (INDEPENDENT_AMBULATORY_CARE_PROVIDER_SITE_OTHER): Payer: Commercial Managed Care - PPO | Admitting: Physician Assistant

## 2017-05-14 VITALS — BP 152/98 | HR 62 | Temp 97.9°F | Resp 16 | Wt 167.0 lb

## 2017-05-14 DIAGNOSIS — B9789 Other viral agents as the cause of diseases classified elsewhere: Secondary | ICD-10-CM

## 2017-05-14 DIAGNOSIS — L84 Corns and callosities: Secondary | ICD-10-CM

## 2017-05-14 DIAGNOSIS — J069 Acute upper respiratory infection, unspecified: Secondary | ICD-10-CM

## 2017-05-14 MED ORDER — BENZONATATE 100 MG PO CAPS: 100.0000 mg | ORAL_CAPSULE | Freq: Three times a day (TID) | ORAL | 0 refills | Status: AC | PRN

## 2017-05-14 NOTE — Progress Notes (Signed)
[facility]  Chief Complaint  Patient presents with  . URI    Started four days ago    Subjective:    Patient ID: Robert Patton, male    DOB: 1950-09-15, 66 y.o.   MRN: [id number]  Upper Respiratory Infection: Robert Patton is a  66 y.o. male with a past medical history significant for HTN complaining of symptoms of a URI. Symptoms include congestion, cough and sore throat. Onset of symptoms was 4 days ago, unchanged since that time. He also c/o congestion, cough described as productive, nasal congestion and sore throat for the past 4 days .  He is drinking plenty of fluids. Evaluation to date: none. Treatment to date: decongestants. He has used mucinex with phenylephrine The treatment has provided moderate relief.   Also has painful spot on his left pinky toe. It hurts when he wears closed toed shoes. He reports putting a bandaid on it which offers mild relief.   Review of Systems  Constitutional: Positive for fatigue. Negative for activity change, appetite change, chills, diaphoresis, fever and unexpected weight change.  HENT: Positive for congestion, postnasal drip, sneezing and sore throat. Negative for ear discharge, ear pain, nosebleeds, rhinorrhea, sinus pressure, sinus pain, tinnitus, trouble swallowing and voice change.   Eyes: Negative.   Respiratory: Positive for cough. Negative for apnea, choking, chest tightness, shortness of breath, wheezing and stridor.   Gastrointestinal: Negative.   Neurological: Negative for dizziness, light-headedness and headaches.       Objective:   BP (!) 152/98 (BP Location: Left Arm, Patient Position: Sitting, Cuff Size: Normal)   Pulse 62   Temp 97.9 F (36.6 C) (Oral)   Resp 16   Wt 167 lb (75.8 kg)   SpO2 99%   BMI 23.96 kg/m   Patient Active Problem List   Diagnosis Date Noted  . Adaptation reaction 12/21/2015  . Allergic rhinitis 12/21/2015  . Back pain, chronic 12/21/2015  . 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 Cervical disc disease 12/21/2015  . History of chicken pox 12/21/2015  . 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 Hernia, inguinal, unilateral 01/02/2009  . Avitaminosis D 09/12/2008  . Essential (primary) hypertension 05/28/2003    Outpatient Encounter Medications as of 05/14/2017  Medication Sig Note  . aspirin 81 MG tablet Take by mouth. 12/21/2015: Received from: [facility]: ASPIRIN EC, 81MG  (Oral Tablet Delayed Release) - Historical Medication  1 daily (81 MG) Active  . hydrochlorothiazide (HYDRODIURIL) 25 MG tablet Take 1 tablet (25 mg total) by mouth daily.   Marland Kitchen losartan (COZAAR) 100 MG tablet Take 1 tablet (100 mg total) by mouth daily.   . meloxicam (MOBIC) 7.5 MG tablet Take 1 tablet (7.5 mg total) by mouth daily as needed for pain.   Marland Kitchen MULTIPLE VITAMIN PO Take by mouth. 12/21/2015: Received from: [facility]: MULTIVITAMINS (Oral Tablet) - Historical Medication  1 Daily Active  . ranitidine (ZANTAC) 150 MG capsule Take 1 capsule (150 mg total) by mouth 2 (two) times daily.   . sildenafil (REVATIO) 20 MG tablet 1 to 4 tablets daily as needed   . Vitamin D, Ergocalciferol, (DRISDOL) 50000 units CAPS capsule TAKE 1 CAPSULE BY MOUTH WEEKLY   . fluticasone (FLONASE) 50 MCG/ACT nasal spray Place 2 sprays into both nostrils daily. (Patient not taking: Reported on 05/14/2017)   . meclizine (ANTIVERT) 25 MG tablet TK 1 T PO  TID PRN    No  facility-administered encounter medications on file as of 05/14/2017.     Allergies  Allergen Reactions  . Amlodipine Besylate     Insomnia  . Diltiazem     Had issues sexually       Physical Exam  Constitutional: He is oriented to person, place, and time. He appears well-developed and well-nourished. No distress.  HENT:  Right Ear: Tympanic membrane and external ear normal.  Left Ear: Tympanic membrane and external ear normal.   Nose: Rhinorrhea present. Right sinus exhibits no maxillary sinus tenderness and no frontal sinus tenderness. Left sinus exhibits no maxillary sinus tenderness and no frontal sinus tenderness.  Mouth/Throat: Uvula is midline, oropharynx is clear and moist and mucous membranes are normal. No oropharyngeal exudate.  Clear rhinorrhea  Eyes: Conjunctivae are normal. Right eye exhibits discharge. Left eye exhibits discharge.  Watery Discharge   Neck: Normal range of motion. Neck supple.  Cardiovascular: Normal rate and regular rhythm.  Pulmonary/Chest: Effort normal and breath sounds normal. No respiratory distress. He has no wheezes. He has no rales.  Lymphadenopathy:    He has no cervical adenopathy.  Neurological: He is alert and oriented to person, place, and time.  Skin: Skin is warm and dry. He is not diaphoretic.  There is a corn present on the lateral aspect of his left pinky toe.  Psychiatric: He has a normal mood and affect. His behavior is normal.       Assessment & Plan:   1. Viral URI with cough  Should not take anything with phenylephrine or pseudoephedrine in it 2/2 HTN. Suspect his BP is elevated today because of this. Otherwise, exam benign.  - benzonatate (TESSALON PERLES) 100 MG capsule; Take 1 capsule (100 mg total) by mouth 3 (three) times daily as needed for up to 7 days for cough.  Dispense: 21 capsule; Refill: 0  2. Corn  Has a corn on left pinky toe. Instructed to get wider shoes, avoid narrow toe boxes, try corn callus pads OTC. May have to see podiatry if no improvement.  Return if symptoms worsen or fail to improve.   The entirety of the information documented in the History of Present Illness, Review of Systems and Physical Exam were personally obtained by me. Portions of this information were initially documented by Ashley Royalty, CMA and reviewed by me for thoroughness and accuracy.

## 2017-05-14 NOTE — Patient Instructions (Signed)
DO NOT TAKE PSEUDOEPEHDRIN OR PHENYLEPHRINE. Take plain MUcinex, no other medications with that.       Please get corn pads.    Corns and Calluses Corns are small areas of thickened skin that occur on the top, sides, or tip of a toe. They contain a cone-shaped core with a point that can press on a nerve below. This causes pain. Calluses are areas of thickened skin that can occur anywhere on the body including hands, fingers, palms, soles of the feet, and heels.Calluses are usually larger than corns. What are the causes? Corns and calluses are caused by rubbing (friction) or pressure, such as from shoes that are too tight or do not fit properly. What increases the risk? Corns are more likely to develop in people who have toe deformities, such as hammer toes. Since calluses can occur with friction to any area of the skin, calluses are more likely to develop in people who:  Work with their hands.  Wear shoes that fit poorly, shoes that are too tight, or shoes that are high-heeled.  Have toes deformities.  What are the signs or symptoms? Symptoms of a corn or callus include:  A hard growth on the skin.  Pain or tenderness under the skin.  Redness and swelling.  Increased discomfort while wearing tight-fitting shoes.  How is this diagnosed? Corns and calluses may be diagnosed with a medical history and physical exam. How is this treated? Corns and calluses may be treated with:  Removing the cause of the friction or pressure. This may include: ? Changing your shoes. ? Wearing shoe inserts (orthotics) or other protective layers in your shoes, such as a corn pad. ? Wearing gloves.  Medicines to help soften skin in the hardened, thickened areas.  Reducing the size of the corn or callus by removing the dead layers of skin.  Antibiotic medicines to treat infection.  Surgery, if a toe deformity is the cause.  Follow these instructions at home:  Take medicines only as  directed by your health care provider.  If you were prescribed an antibiotic, finish all of it even if you start to feel better.  Wear shoes that fit well. Avoid wearing high-heeled shoes and shoes that are too tight or too loose.  Wear any padding, protective layers, gloves, or orthotics as directed by your health care provider.  Soak your hands or feet and then use a file or pumice stone to soften your corn or callus. Do this as directed by your health care provider.  Check your corn or callus every day for signs of infection. Watch for: ? Redness, swelling, or pain. ? Fluid, blood, or pus. Contact a health care provider if:  Your symptoms do not improve with treatment.  You have increased redness, swelling, or pain at the site of your corn or callus.  You have fluid, blood, or pus coming from your corn or callus.  You have new symptoms. This information is not intended to replace advice given to you by your health care provider. Make sure you discuss any questions you have with your health care provider. Document Released: 02/17/2004 Document Revised: 12/01/2015 Document Reviewed: 05/09/2014 Elsevier Interactive Patient Education  [employer].

## 2017-06-27 ENCOUNTER — Other Ambulatory Visit: Payer: Self-pay | Admitting: Family Medicine

## 2017-06-27 DIAGNOSIS — I10 Essential (primary) hypertension: Secondary | ICD-10-CM

## 2017-07-15 DIAGNOSIS — R351 Nocturia: Secondary | ICD-10-CM | POA: Diagnosis not present

## 2017-07-15 DIAGNOSIS — N401 Enlarged prostate with lower urinary tract symptoms: Secondary | ICD-10-CM | POA: Diagnosis not present

## 2017-07-21 ENCOUNTER — Encounter
Admission: RE | Admit: 2017-07-21 | Discharge: 2017-07-21 | Disposition: A | Discharge status: Home / Self Care | Payer: Commercial Managed Care - PPO | Source: Ambulatory Visit | Attending: Urology | Admitting: Urology

## 2017-07-21 ENCOUNTER — Other Ambulatory Visit: Payer: Self-pay

## 2017-07-21 DIAGNOSIS — Z79899 Other long term (current) drug therapy: Secondary | ICD-10-CM | POA: Diagnosis not present

## 2017-07-21 DIAGNOSIS — N529 Male erectile dysfunction, unspecified: Secondary | ICD-10-CM | POA: Diagnosis not present

## 2017-07-21 DIAGNOSIS — I10 Essential (primary) hypertension: Secondary | ICD-10-CM | POA: Diagnosis not present

## 2017-07-21 DIAGNOSIS — Z791 Long term (current) use of non-steroidal anti-inflammatories (NSAID): Secondary | ICD-10-CM | POA: Diagnosis not present

## 2017-07-21 DIAGNOSIS — N400 Enlarged prostate without lower urinary tract symptoms: Secondary | ICD-10-CM | POA: Diagnosis not present

## 2017-07-21 DIAGNOSIS — Z7982 Long term (current) use of aspirin: Secondary | ICD-10-CM | POA: Diagnosis not present

## 2017-07-21 DIAGNOSIS — K4090 Unilateral inguinal hernia, without obstruction or gangrene, not specified as recurrent: Secondary | ICD-10-CM | POA: Diagnosis not present

## 2017-07-21 DIAGNOSIS — K219 Gastro-esophageal reflux disease without esophagitis: Secondary | ICD-10-CM | POA: Diagnosis not present

## 2017-07-21 LAB — BASIC METABOLIC PANEL
Anion gap: 7 (ref 5–15)
BUN: 23 mg/dL — ABNORMAL HIGH (ref 6–20)
CALCIUM: 8.9 mg/dL (ref 8.9–10.3)
CO2: 26 mmol/L (ref 22–32)
Chloride: 103 mmol/L (ref 101–111)
Creatinine, Ser: 1.02 mg/dL (ref 0.61–1.24)
GFR calc non Af Amer: >60 mL/min (ref >60)
Glucose, Bld: 102 mg/dL — ABNORMAL HIGH (ref 65–99)
Potassium: 3.4 mmol/L — ABNORMAL LOW (ref 3.5–5.1)
SODIUM: 136 mmol/L (ref 135–145)

## 2017-07-21 LAB — CBC WITH DIFFERENTIAL/PLATELET
BASOS ABS: 0 10*3/uL (ref 0–0.1)
BASOS PCT: 1 %
Eosinophils Absolute: 0.2 10*3/uL (ref 0–0.7)
Eosinophils Relative: 5 %
HEMATOCRIT: 39.6 % — AB (ref 40.0–52.0)
Hemoglobin: 13 g/dL (ref 13.0–18.0)
Lymphocytes Relative: 43 %
Lymphs Abs: 1.8 10*3/uL (ref 1.0–3.6)
MCH: 28.5 pg (ref 26.0–34.0)
MCHC: 32.9 g/dL (ref 32.0–36.0)
MCV: 86.7 fL (ref 80.0–100.0)
MONO ABS: 0.4 10*3/uL (ref 0.2–1.0)
Monocytes Relative: 10 %
NEUTROS ABS: 1.7 10*3/uL (ref 1.4–6.5)
NEUTROS PCT: 41 %
Platelets: 183 10*3/uL (ref 150–440)
RBC: 4.57 MIL/uL (ref 4.40–5.90)
RDW: 14.2 % (ref 11.5–14.5)
WBC: 4.2 10*3/uL (ref 3.8–10.6)

## 2017-07-21 NOTE — Pre-Procedure Instructions (Signed)
EKG NOTED WITH PREVIOUS

## 2017-07-21 NOTE — Patient Instructions (Signed)
Your procedure is scheduled on: Tuesday, February 25  Report to [facility]  PER DR. Sultan  Remember: Instructions that are not followed completely may result in serious medical risk, up to and including death, or upon the discretion of your surgeon  and anesthesiologist your surgery may need to be rescheduled.     _X__ 1. Do not eat food after midnight the night before your procedure.                 No gum chewing or hard candies.                  You may drink clear liquids up to 2 hours                 before you are scheduled to arrive for your surgery-                  DO not drink clear                 liquids within 2 hours of the start of your surgery.                  Clear Liquids include:  water, apple juice without pulp, clear carbohydrate                 drink such as Clearfast of Gartorade, Black Coffee or Tea (Do not add                 anything to coffee or tea).  __X__2.  On the morning of surgery brush your teeth with toothpaste and water,                        you may rinse your mouth with mouthwash if you wish.                                Do not swallow any toothpaste of mouthwash.     _X__ 3.  No Alcohol for 24 hours before or after surgery.   _X__ 4.  Do Not Smoke or use e-cigarettes For 24 Hours Prior to Your Surgery.                 Do not use any chewable tobacco products for at least 6 hours prior to                 surgery.  ____  5.  Bring all medications with you on the day of surgery if instructed.   ____  6.  Notify your doctor if there is any change in your medical condition      (cold, fever, infections).     Do not wear jewelry, make-up, hairpins, clips or nail polish. Do not wear lotions, powders, or perfumes. You may wear deodorant. Do not shave 48 hours prior to surgery. Men may shave face and neck. Do not bring valuables to the hospital.    [facility]  is not responsible for any belongings or valuables.  Contacts, dentures or bridgework may not be worn into surgery. Leave your suitcase in the car. After surgery it may be brought to your room. For patients admitted to the hospital, discharge time is determined by your treatment team.   Patients discharged the day of surgery will not be  allowed to drive home.   Please read over the following fact sheets that you were given:   PREPARING FOR SURGERY   ____ Take these medicines the morning of surgery with A SIP OF WATER:    1. NONE  2.   3.   4.  5.  6.  ____ Fleet Enema (as directed)   __X__ Use CHG Soap as directed  ____ Use inhalers on the day of surgery   ___ ASPIRIN HAS BEEN STOPPED!!  ____ Stop Anti-inflammatories on NOW!   ____ Stop supplements until after surgery.    ____ Bring C-Pap to the hospital.   DO NOT TAKE MELOXICAM TODAY.  IT MAY BE RESUMED AFTER SURGERY  HAVE A STOOL SOFTENER FOR HOME USE  HAVE A FIRM PILLOW FOR SUPPORT OF THE SURGERY SITE.

## 2017-07-22 ENCOUNTER — Encounter
Admission: RE | Disposition: A | Discharge status: Home / Self Care | Payer: Self-pay | Source: Ambulatory Visit | Attending: Urology

## 2017-07-22 ENCOUNTER — Ambulatory Visit: Payer: Commercial Managed Care - PPO | Admitting: Anesthesiology

## 2017-07-22 ENCOUNTER — Ambulatory Visit
Admission: RE | Admit: 2017-07-22 | Discharge: 2017-07-22 | Disposition: A | Discharge status: Home / Self Care | Payer: Commercial Managed Care - PPO | Source: Ambulatory Visit | Attending: Urology | Admitting: Urology

## 2017-07-22 ENCOUNTER — Encounter: Payer: Self-pay | Admitting: *Deleted

## 2017-07-22 ENCOUNTER — Other Ambulatory Visit: Payer: Self-pay

## 2017-07-22 DIAGNOSIS — K4090 Unilateral inguinal hernia, without obstruction or gangrene, not specified as recurrent: Secondary | ICD-10-CM | POA: Diagnosis not present

## 2017-07-22 DIAGNOSIS — K219 Gastro-esophageal reflux disease without esophagitis: Secondary | ICD-10-CM | POA: Diagnosis not present

## 2017-07-22 DIAGNOSIS — Z79899 Other long term (current) drug therapy: Secondary | ICD-10-CM | POA: Diagnosis not present

## 2017-07-22 DIAGNOSIS — N400 Enlarged prostate without lower urinary tract symptoms: Secondary | ICD-10-CM | POA: Diagnosis not present

## 2017-07-22 DIAGNOSIS — I10 Essential (primary) hypertension: Secondary | ICD-10-CM | POA: Insufficient documentation

## 2017-07-22 DIAGNOSIS — Z7982 Long term (current) use of aspirin: Secondary | ICD-10-CM | POA: Insufficient documentation

## 2017-07-22 DIAGNOSIS — Z791 Long term (current) use of non-steroidal anti-inflammatories (NSAID): Secondary | ICD-10-CM | POA: Insufficient documentation

## 2017-07-22 DIAGNOSIS — N529 Male erectile dysfunction, unspecified: Secondary | ICD-10-CM | POA: Diagnosis not present

## 2017-07-22 HISTORY — PX: INGUINAL HERNIA REPAIR: SHX194

## 2017-07-22 SURGERY — REPAIR, HERNIA, INGUINAL, ADULT
Anesthesia: General | Laterality: Right

## 2017-07-22 MED ORDER — FAMOTIDINE 20 MG PO TABS
20.0000 mg | ORAL_TABLET | Freq: Once | ORAL | Status: AC
  Administered 2017-07-22: 20 mg via ORAL

## 2017-07-22 MED ORDER — ONDANSETRON HCL 4 MG/2ML IJ SOLN
INTRAMUSCULAR | Status: AC
  Filled 2017-07-22: qty 2

## 2017-07-22 MED ORDER — LIDOCAINE-EPINEPHRINE 1 %-1:100000 IJ SOLN
INTRAMUSCULAR | Status: DC | PRN
  Administered 2017-07-22: 10 mL

## 2017-07-22 MED ORDER — LACTATED RINGERS IV SOLN
INTRAVENOUS | Status: DC
  Administered 2017-07-22: 16:00:00 via INTRAVENOUS

## 2017-07-22 MED ORDER — ONDANSETRON HCL 4 MG/2ML IJ SOLN
INTRAMUSCULAR | Status: DC | PRN
  Administered 2017-07-22: 4 mg via INTRAVENOUS

## 2017-07-22 MED ORDER — CEPHALEXIN 500 MG PO CAPS: 500.0000 mg | ORAL_CAPSULE | Freq: Four times a day (QID) | ORAL | 0 refills | Status: DC

## 2017-07-22 MED ORDER — GLYCOPYRROLATE 0.2 MG/ML IJ SOLN
INTRAMUSCULAR | Status: AC
  Filled 2017-07-22: qty 1

## 2017-07-22 MED ORDER — PROPOFOL 10 MG/ML IV BOLUS
INTRAVENOUS | Status: AC
  Filled 2017-07-22: qty 20

## 2017-07-22 MED ORDER — PROPOFOL 10 MG/ML IV BOLUS
INTRAVENOUS | Status: DC | PRN
  Administered 2017-07-22: 150 mg via INTRAVENOUS
  Administered 2017-07-22: 50 mg via INTRAVENOUS

## 2017-07-22 MED ORDER — FENTANYL CITRATE (PF) 100 MCG/2ML IJ SOLN
25.0000 ug | INTRAMUSCULAR | Status: DC | PRN
  Administered 2017-07-22 (×3): 25 ug via INTRAVENOUS

## 2017-07-22 MED ORDER — DEXAMETHASONE SODIUM PHOSPHATE 10 MG/ML IJ SOLN
INTRAMUSCULAR | Status: AC
  Filled 2017-07-22: qty 1

## 2017-07-22 MED ORDER — OXYCODONE HCL 5 MG PO TABS
ORAL_TABLET | ORAL | Status: AC
  Administered 2017-07-22: 5 mg via ORAL
  Filled 2017-07-22: qty 1

## 2017-07-22 MED ORDER — ACETAMINOPHEN-CODEINE #3 300-30 MG PO TABS: 1.0000 | ORAL_TABLET | ORAL | 2 refills | Status: DC | PRN

## 2017-07-22 MED ORDER — CEFAZOLIN SODIUM-DEXTROSE 1-4 GM/50ML-% IV SOLN
1.0000 g | Freq: Once | INTRAVENOUS | Status: AC
  Administered 2017-07-22: 1 g via INTRAVENOUS

## 2017-07-22 MED ORDER — KETOROLAC TROMETHAMINE 30 MG/ML IJ SOLN
INTRAMUSCULAR | Status: DC | PRN
  Administered 2017-07-22: 30 mg via INTRAVENOUS

## 2017-07-22 MED ORDER — NUCYNTA 50 MG PO TABS: 50.0000 mg | ORAL_TABLET | Freq: Four times a day (QID) | ORAL | 0 refills | Status: DC | PRN

## 2017-07-22 MED ORDER — MIDAZOLAM HCL 2 MG/2ML IJ SOLN
INTRAMUSCULAR | Status: DC | PRN
  Administered 2017-07-22: 2 mg via INTRAVENOUS

## 2017-07-22 MED ORDER — FENTANYL CITRATE (PF) 100 MCG/2ML IJ SOLN
INTRAMUSCULAR | Status: AC
  Administered 2017-07-22: 25 ug via INTRAVENOUS
  Filled 2017-07-22: qty 2

## 2017-07-22 MED ORDER — PHENYLEPHRINE HCL 10 MG/ML IJ SOLN
INTRAMUSCULAR | Status: DC | PRN
  Administered 2017-07-22 (×2): 100 ug via INTRAVENOUS

## 2017-07-22 MED ORDER — NEOMYCIN-POLYMYXIN B GU 40-200000 IR SOLN
Status: DC | PRN
  Administered 2017-07-22: 2 mL

## 2017-07-22 MED ORDER — KETOROLAC TROMETHAMINE 30 MG/ML IJ SOLN
INTRAMUSCULAR | Status: AC
  Filled 2017-07-22: qty 1

## 2017-07-22 MED ORDER — FENTANYL CITRATE (PF) 100 MCG/2ML IJ SOLN
INTRAMUSCULAR | Status: DC | PRN
  Administered 2017-07-22: 25 ug via INTRAVENOUS
  Administered 2017-07-22: 125 ug via INTRAVENOUS

## 2017-07-22 MED ORDER — OXYCODONE HCL 5 MG/5ML PO SOLN: 5.0000 mg | Freq: Once | ORAL | Status: AC | PRN

## 2017-07-22 MED ORDER — OXYCODONE HCL 5 MG PO TABS
5.0000 mg | ORAL_TABLET | Freq: Once | ORAL | Status: AC | PRN
  Administered 2017-07-22: 5 mg via ORAL

## 2017-07-22 MED ORDER — GLYCOPYRROLATE 0.2 MG/ML IJ SOLN
INTRAMUSCULAR | Status: DC | PRN
  Administered 2017-07-22: 0.2 mg via INTRAVENOUS

## 2017-07-22 MED ORDER — DOCUSATE SODIUM 100 MG PO CAPS: 200.0000 mg | ORAL_CAPSULE | Freq: Two times a day (BID) | ORAL | 3 refills | Status: DC

## 2017-07-22 MED ORDER — BUPIVACAINE HCL 0.5 % IJ SOLN
INTRAMUSCULAR | Status: DC | PRN
  Administered 2017-07-22: 15 mL

## 2017-07-22 MED ORDER — DEXAMETHASONE SODIUM PHOSPHATE 10 MG/ML IJ SOLN
INTRAMUSCULAR | Status: DC | PRN
  Administered 2017-07-22: 10 mg via INTRAVENOUS

## 2017-07-22 SURGICAL SUPPLY — 38 items
BLADE SURG 15 STRL LF DISP TIS (BLADE) ×1 IMPLANT
BLADE SURG 15 STRL SS (BLADE) ×2
CANISTER SUCT 1200ML W/VALVE (MISCELLANEOUS) ×3 IMPLANT
CHLORAPREP W/TINT 26ML (MISCELLANEOUS) ×3 IMPLANT
CLOSURE WOUND 1/2 X4 (GAUZE/BANDAGES/DRESSINGS) ×1
DERMABOND ADVANCED (GAUZE/BANDAGES/DRESSINGS) ×2
DERMABOND ADVANCED .7 DNX12 (GAUZE/BANDAGES/DRESSINGS) ×1 IMPLANT
DRAIN PENROSE 1/4X12 LTX (DRAIN) ×3 IMPLANT
DRAPE LAPAROTOMY 100X77 ABD (DRAPES) ×3 IMPLANT
DRSG TEGADERM 4X4.75 (GAUZE/BANDAGES/DRESSINGS) ×3 IMPLANT
DRSG TELFA 4X3 1S NADH ST (GAUZE/BANDAGES/DRESSINGS) ×3 IMPLANT
ELECT REM PT RETURN 9FT ADLT (ELECTROSURGICAL) ×3
ELECTRODE REM PT RTRN 9FT ADLT (ELECTROSURGICAL) ×1 IMPLANT
GLOVE BIO SURGEON STRL SZ7.5 (GLOVE) ×3 IMPLANT
GOWN STRL REUS W/ TWL LRG LVL3 (GOWN DISPOSABLE) ×1 IMPLANT
GOWN STRL REUS W/ TWL XL LVL3 (GOWN DISPOSABLE) ×1 IMPLANT
GOWN STRL REUS W/TWL LRG LVL3 (GOWN DISPOSABLE) ×2
GOWN STRL REUS W/TWL XL LVL3 (GOWN DISPOSABLE) ×2
KIT TURNOVER KIT A (KITS) ×3 IMPLANT
LABEL OR SOLS (LABEL) IMPLANT
MESH HERNIA 4.5X10 SYS PRO LRG (Mesh General) ×1 IMPLANT
MESH HERNIA SYS PROLENE LG (Mesh General) ×2 IMPLANT
NEEDLE HYPO 25X1 1.5 SAFETY (NEEDLE) ×3 IMPLANT
NS IRRIG 500ML POUR BTL (IV SOLUTION) ×3 IMPLANT
PACK BASIN MINOR ARMC (MISCELLANEOUS) ×3 IMPLANT
SPONGE KITTNER 5P (MISCELLANEOUS) ×3 IMPLANT
STRIP CLOSURE SKIN 1/2X4 (GAUZE/BANDAGES/DRESSINGS) ×2 IMPLANT
SUPPORT SCROTAL LG STRP (MISCELLANEOUS) ×2 IMPLANT
SUPPORTER ATHLETIC LG (MISCELLANEOUS) ×1
SUT CHROMIC 3-0 (SUTURE) ×2
SUT CHROMIC 3-0 54XMFL REEL CR (SUTURE) ×1
SUT PLAIN 3 0 SH 27IN (SUTURE) ×3 IMPLANT
SUT SURGILON 0 BLK (SUTURE) IMPLANT
SUT VIC AB 4-0 PS2 18 (SUTURE) ×3 IMPLANT
SUTURE CHRMC 3-0 54XMFL REL CR (SUTURE) ×1 IMPLANT
SWABSTK COMLB BENZOIN TINCTURE (MISCELLANEOUS) ×3 IMPLANT
SYR 10ML LL (SYRINGE) ×3 IMPLANT
SYR BULB IRRIG 60ML STRL (SYRINGE) ×3 IMPLANT

## 2017-07-22 NOTE — Transfer of Care (Signed)
Immediate Anesthesia Transfer of Care Note  Patient: Robert Patton  Procedure(s) Performed: HERNIA REPAIR INGUINAL ADULT (Right )  Patient Location: PACU  Anesthesia Type:General  Level of Consciousness: drowsy and patient cooperative  Airway & Oxygen Therapy: Patient Spontanous Breathing and Patient connected to face mask oxygen  Post-op Assessment: Report given to RN and Post -op Vital signs reviewed and stable  Post vital signs: Reviewed and stable  Last Vitals:  Vitals:   07/22/17 1500 07/22/17 1700  BP: (!) 183/87 119/77  Pulse: 82 69  Resp: 16 (!) 7  Temp: 37.1 C 36.6 C  SpO2: 100% 100%    Last Pain:  Vitals:   07/22/17 1500  TempSrc: Temporal         Complications: No apparent anesthesia complications

## 2017-07-22 NOTE — H&P (Signed)
Date of Initial H&P: 07/15/17  History reviewed, patient examined, no change in status, stable for surgery.

## 2017-07-22 NOTE — Discharge Instructions (Addendum)
PATIENT INSTRUCTIONS HERNIA  FOLLOW-UP:    Call your physician immediately if you have any fevers greater than 102.5, drainage from you wound that is not clear or looks infected, persistent bleeding, increasing abdominal pain, problems urinating, or persistent nausea/vomiting.    WOUND CARE INSTRUCTIONS:  Keep a dry clean dressing on the wound if there is drainage. The initial bandage may be removed after 24 hours.  Once the wound has quit draining you may leave it open to air.  If clothing rubs against the wound or causes irritation and the wound is not draining you may cover it with a dry dressing during the daytime.  Try to keep the wound dry and avoid ointments on the wound unless directed to do so.  If the wound becomes bright red and painful or starts to drain infected material that is not clear, please contact your physician immediately.  If the wound is mildly pink and has a thick firm ridge underneath it, this is normal, and is referred to as a healing ridge.  This will resolve over the next 4-6 weeks.  DIET:  You may eat any foods that you can tolerate.  It is a good idea to eat a high fiber diet and take in plenty of fluids to prevent constipation.  If you do become constipated you may want to take a mild laxative or take ducolax tablets on a daily basis until your bowel habits are regular.  Constipation can be very uncomfortable, along with straining, after recent abdominal surgery.  ACTIVITY:  You are encouraged to cough and deep breath or use your incentive spirometer if you were given one, every 15-30 minutes when awake.  This will help prevent respiratory complications and low grade fevers post-operatively.  You may want to hug a pillow when coughing and sneezing to add additional support to the surgical area which will decrease pain during these times.  You are encouraged to walk and engage in light activity for the next two weeks.  You should not lift more than 20 pounds during this time  frame as it could put you at increased risk for a hernia recurrence.  Twenty pounds is roughly equivalent to a plastic bag of groceries.    MEDICATIONS:  Try to take narcotic medications and anti-inflammatory medications, such as tylenol, ibuprofen, naprosyn, etc., with food.  This will minimize stomach upset from the medication.  Should you develop nausea and vomiting from the pain medication, or develop a rash, please discontinue the medication and contact your physician.  You should not drive, make important decisions, or operate machinery when taking narcotic pain medication.  QUESTIONS:  Please feel free to call your physician or the hospital operator if you have any questions, and they will be glad to assist you.  Inguinal Hernia, Adult An inguinal hernia is when fat or the intestines push through the area where the leg meets the lower abdomen (groin) and create a rounded lump (bulge). This condition develops over time. There are three types of inguinal hernias. These types include:  Hernias that can be pushed back into the belly (are reducible).  Hernias that are not reducible (are incarcerated).  Hernias that are not reducible and lose their blood supply (are strangulated). This type of hernia requires emergency surgery.  What are the causes? This condition is caused by having a weak spot in the muscles or tissue. This weakness lets the hernia poke through. This condition can be triggered by:  Suddenly straining the muscles of  the lower abdomen.  Lifting heavy objects.  Straining to have a bowel movement. Difficult bowel movements (constipation) can lead to this.  Coughing.  What increases the risk? This condition is more likely to develop in:  Men.  Pregnant women.  People who: ? Are overweight. ? Work in jobs that require long periods of standing or heavy lifting. ? Have had an inguinal hernia before. ? Smoke or have lung disease. These factors can lead to long-lasting  (chronic) coughing.  What are the signs or symptoms? Symptoms can depend on the size of the hernia. Often, a small inguinal hernia has no symptoms. Symptoms of a larger hernia include:  A lump in the groin. This is easier to see when the person is standing. It might not be visible when he or she is lying down.  Pain or burning in the groin. This occurs especially when lifting, straining, or coughing.  A dull ache or a feeling of pressure in the groin.  A lump in the scrotum in men.  Symptoms of a strangulated inguinal hernia can include:  A bulge in the groin that is very painful and tender to the touch.  A bulge that turns red or purple.  Fever, nausea, and vomiting.  The inability to have a bowel movement or to pass gas.  How is this diagnosed? This condition is diagnosed with a medical history and physical exam. Your health care provider may feel your groin area and ask you to cough. How is this treated? Treatment for this condition varies depending on the size of your hernia and whether you have symptoms. If you do not have symptoms, your health care provider may have you watch your hernia carefully and come in for follow-up visits. If your hernia is larger or if you have symptoms, your treatment will include surgery. Follow these instructions at home: Lifestyle  Drink enough fluid to keep your urine clear or pale yellow.  Eat a diet that includes a lot of fiber. Eat plenty of fruits, vegetables, and whole grains. Talk with your health care provider if you have questions.  Avoid lifting heavy objects.  Avoid standing for long periods of time.  Do not use tobacco products, including cigarettes, chewing tobacco, or e-cigarettes. If you need help quitting, ask your health care provider.  Maintain a healthy weight. General instructions  Do not try to force the hernia back in.  Watch your hernia for any changes in color or size. Let your health care provider know if any  changes occur.  Take over-the-counter and prescription medicines only as told by your health care provider.  Keep all follow-up visits as told by your health care provider. This is important. Contact a health care provider if:  You have a fever.  You have new symptoms.  Your symptoms get worse. Get help right away if:  You have pain in the groin that suddenly gets worse.  A bulge in the groin gets bigger suddenly and does not go down.  You are a man and you have a sudden pain in the scrotum, or the size of your scrotum suddenly changes.  A bulge in the groin area becomes red or purple and is painful to the touch.  You have nausea or vomiting that does not go away.  You feel your heart beating a lot more quickly than normal.  You cannot have a bowel movement or pass gas. This information is not intended to replace advice given to you by your health  care provider. Make sure you discuss any questions you have with your health care provider. Document Released: 09/29/2008 Document Revised: 10/19/2015 Document Reviewed: 03/23/2014 Elsevier Interactive Patient Education  2018 [place] A hernia happens when an organ or tissue inside your body pushes out through a weak spot in the belly (abdomen). Follow these instructions at home:  Avoid stretching or overusing (straining) the muscles near the hernia.  Do not lift anything heavier than 10 lb (4.5 kg).  Use the muscles in your leg when you lift something up. Do not use the muscles in your back.  When you cough, try to cough gently.  Eat a diet that has a lot of fiber. Eat lots of fruits and vegetables.  Drink enough fluids to keep your pee (urine) clear or pale yellow. Try to drink 6-8 glasses of water a day.  Take medicines to make your poop soft (stool softeners) as told by your doctor.  Lose weight, if you are overweight.  Do not use any tobacco products, including cigarettes, chewing tobacco, or electronic  cigarettes. If you need help quitting, ask your doctor.  Keep all follow-up visits as told by your doctor. This is important. Contact a doctor if:  The skin by the hernia gets puffy (swollen) or red.  The hernia is painful. Get help right away if:  You have a fever.  You have belly pain that is getting worse.  You feel sick to your stomach (nauseous) or you throw up (vomit).  You cannot push the hernia back in place by gently pressing on it while you are lying down.  The hernia: ? Changes in shape or size. ? Is stuck outside your belly. ? Changes color. ? Feels hard or tender. This information is not intended to replace advice given to you by your health care provider. Make sure you discuss any questions you have with your health care provider. Document Released: 10/31/2009 Document Revised: 10/19/2015 Document Reviewed: 03/23/2014 Elsevier Interactive Patient Education  2018 [place]   1) The drugs that you were given will stay in your system until tomorrow so for the next 24 hours you should not:  A) Drive an automobile B) Make any legal decisions C) Drink any alcoholic beverage   2) You may resume regular meals tomorrow.  Today it is better to start with liquids and gradually work up to solid foods.  You may eat anything you prefer, but it is better to start with liquids, then soup and crackers, and gradually work up to solid foods.   3) Please notify your doctor immediately if you have any unusual bleeding, trouble breathing, redness and pain at the surgery site, drainage, fever, or pain not relieved by medication.    4) Additional Instructions:   Please contact your physician with any problems or Same Day Surgery at [phone], Monday through Friday 6 am to 4 pm, or [place] at [facility] number at [phone].

## 2017-07-22 NOTE — Op Note (Signed)
Preoperative diagnosis: Right inguinal hernia  Postoperative diagnosis: Same  Procedure: Right inguinal herniorrhaphy  Surgeon: Otelia Limes. Yves Dill MD  Anesthesia: General  Indications:See the history and physical. After informed consent the above procedure(s) were requested     Technique and findings: After adequate general anesthesia been obtained the patient's abdomen and perineum were prepped and draped in the usual fashion.  A 3 cm right inguinal crease incision was made and carried down sharply through the skin and through the subcutaneous fatty tissue with electrocautery.  The spermatic cord and hernia sac were identified and cleared of overlying fatty tissue.  The ilioinguinal nerve was identified and carefully preserved.  Using blunt and sharp dissection the hernia sac was separated from the spermatic cord and pushed back into the abdominal cavity.  The external oblique fascia was divided along its course and from muscle tissue to create a space for the graft.  Blunt finger dissection the retropubic space was developed.  The retropubic space was then irrigated with GU irrigant.  The Ethicon PHSE graft (large) was selected and the circular portion unfurled in the retropubic space.  The oblong external section of the graft was placed beneath the external oblique fascia.  A keyhole incision was made in the lateral portion of the oblong section of the graft to accommodate the spermatic cord.  The edges of the graft were brought around the spermatic cord and sutured to the inguinal ligament with a 2-0 Surgilon suture.  The distal end of the oblong portion of the graft was anchored to the pubic tubercle with a 2-) Surgilon suture.  The external oblique fascia was then reapproximated with interrupted 2-0 Surgilon suture. A solution of quarter percent Marcaine 1% Xylocaine with epinephrine was injected subcutaneously.  Subcutaneous fatty tissue was reapproximated with interrupted 3-0 chromic and skin  approximated a running 4-0 Vicryl suture.  Dermabond, benzoin, Steri-Strips, sterile dressing and scrotal supporter were applied.  Sponge, instrument, and needle count were noted be correct.  Blood loss was less than 10 cc.  Procedure was then terminated and patient transferred to the recovery room in stable condition.

## 2017-07-22 NOTE — Anesthesia Postprocedure Evaluation (Signed)
Anesthesia Post Note  Patient: Zekiel Torian  Procedure(s) Performed: HERNIA REPAIR INGUINAL ADULT (Right )  Patient location during evaluation: PACU Anesthesia Type: General Level of consciousness: awake and alert Pain management: pain level controlled Vital Signs Assessment: post-procedure vital signs reviewed and stable Respiratory status: spontaneous breathing, nonlabored ventilation, respiratory function stable and patient connected to nasal cannula oxygen Cardiovascular status: blood pressure returned to baseline and stable Postop Assessment: no apparent nausea or vomiting Anesthetic complications: no     Last Vitals:  Vitals:   07/22/17 1756 07/22/17 1833  BP: (!) 162/73 (!) 162/79  Pulse: 68 (!) 57  Resp: 14 14  Temp: 36.7 C   SpO2: 97% 100%    Last Pain:  Vitals:   07/22/17 1833  TempSrc:   PainSc: 6                  Precious Haws Marcianne Ozbun

## 2017-07-22 NOTE — Anesthesia Procedure Notes (Signed)
Procedure Name: LMA Insertion Date/Time: 07/22/2017 3:44 PM Performed by: Jonna Clark, CRNA Pre-anesthesia Checklist: Patient identified, Patient being monitored, Timeout performed, Emergency Drugs available and Suction available Patient Re-evaluated:Patient Re-evaluated prior to induction Oxygen Delivery Method: Circle system utilized Preoxygenation: Pre-oxygenation with 100% oxygen Induction Type: IV induction Ventilation: Mask ventilation without difficulty LMA: LMA inserted LMA Size: 5.0 Tube type: Oral Number of attempts: 1 Placement Confirmation: positive ETCO2 and breath sounds checked- equal and bilateral Tube secured with: Tape Dental Injury: Teeth and Oropharynx as per pre-operative assessment

## 2017-07-22 NOTE — Anesthesia Post-op Follow-up Note (Signed)
Anesthesia QCDR form completed.        

## 2017-07-22 NOTE — Anesthesia Preprocedure Evaluation (Signed)
Anesthesia Evaluation  Patient identified by MRN, date of birth, ID band Patient awake    Reviewed: Allergy & Precautions, H&P , NPO status , Patient's Chart, lab work & pertinent test results  History of Anesthesia Complications Negative for: history of anesthetic complications  Airway Mallampati: III  TM Distance: <3 FB Neck ROM: limited    Dental  (+) Chipped   Pulmonary neg pulmonary ROS, neg shortness of breath,           Cardiovascular Exercise Tolerance: Good hypertension, (-) angina(-) Past MI and (-) DOE      Neuro/Psych PSYCHIATRIC DISORDERS    GI/Hepatic negative GI ROS, Neg liver ROS, neg GERD  ,  Endo/Other  negative endocrine ROS  Renal/GU      Musculoskeletal   Abdominal   Peds  Hematology negative hematology ROS (+)   Anesthesia Other Findings Past Medical History: No date: Hyperlipidemia No date: Hypertension  Past Surgical History: 08/15/2009: COLONOSCOPY 2016: EYE SURGERY; Right     Comment:  CATARACT EXTRACTION No date: NO PAST SURGERIES  BMI    Body Mass Index:  24.11 kg/m      Reproductive/Obstetrics negative OB ROS                             Anesthesia Physical Anesthesia Plan  ASA: II  Anesthesia Plan: General   Post-op Pain Management:    Induction: Intravenous  PONV Risk Score and Plan: 2  Airway Management Planned: LMA  Additional Equipment:   Intra-op Plan:   Post-operative Plan: Extubation in OR  Informed Consent: I have reviewed the patients History and Physical, chart, labs and discussed the procedure including the risks, benefits and alternatives for the proposed anesthesia with the patient or authorized representative who has indicated his/her understanding and acceptance.   Dental Advisory Given  Plan Discussed with: Anesthesiologist, CRNA and Surgeon  Anesthesia Plan Comments: (Patient consented for risks of anesthesia  including but not limited to:  - adverse reactions to medications - damage to teeth, lips or other oral mucosa - sore throat or hoarseness - Damage to heart, brain, lungs or loss of life  Patient voiced understanding.)        Anesthesia Quick Evaluation

## 2017-07-23 ENCOUNTER — Encounter: Payer: Self-pay | Admitting: Urology

## 2017-07-29 ENCOUNTER — Other Ambulatory Visit: Payer: Self-pay | Admitting: Family Medicine

## 2017-08-07 DIAGNOSIS — R221 Localized swelling, mass and lump, neck: Secondary | ICD-10-CM | POA: Diagnosis not present

## 2017-09-29 ENCOUNTER — Telehealth: Payer: Self-pay | Admitting: Family Medicine

## 2017-09-29 NOTE — Telephone Encounter (Signed)
Patient advised tro check with pharmacy if his was recalled.

## 2017-09-29 NOTE — Telephone Encounter (Signed)
Pt called wanting to talk to some one about his blood pressure medication being on recall  His call back is [phone]  Thanks teri

## 2017-10-02 ENCOUNTER — Other Ambulatory Visit: Payer: Self-pay | Admitting: Family Medicine

## 2017-10-02 DIAGNOSIS — I10 Essential (primary) hypertension: Secondary | ICD-10-CM

## 2017-10-15 ENCOUNTER — Telehealth: Payer: Self-pay | Admitting: Family Medicine

## 2017-10-15 NOTE — Telephone Encounter (Signed)
Robitussin DM best option for now.

## 2017-10-15 NOTE — Telephone Encounter (Signed)
Pt has a cough and cold and would like something sent to the pharmacy.  [facility] [address]

## 2017-10-15 NOTE — Telephone Encounter (Signed)
L/M advising below.  

## 2017-10-15 NOTE — Telephone Encounter (Signed)
Patient reports that he is wanting something to help with his cough. He reports that he has had symptoms for 2 days. He has not tried anything OTC. He denies any fever, shortness of breath, wheezing, or chest pain. He does have occasional PND. Cough is starting to become productive. Would like something sent into [address]. Thanks!

## 2017-11-06 ENCOUNTER — Encounter: Payer: Self-pay | Admitting: Family Medicine

## 2017-11-06 ENCOUNTER — Ambulatory Visit
Admission: RE | Admit: 2017-11-06 | Discharge: 2017-11-06 | Disposition: A | Discharge status: Home / Self Care | Payer: Commercial Managed Care - PPO | Source: Ambulatory Visit | Attending: Family Medicine | Admitting: Family Medicine

## 2017-11-06 ENCOUNTER — Ambulatory Visit (INDEPENDENT_AMBULATORY_CARE_PROVIDER_SITE_OTHER): Payer: Commercial Managed Care - PPO | Admitting: Family Medicine

## 2017-11-06 VITALS — BP 162/84 | HR 80 | Temp 98.3°F | Resp 16 | Wt 168.0 lb

## 2017-11-06 DIAGNOSIS — R05 Cough: Secondary | ICD-10-CM

## 2017-11-06 DIAGNOSIS — M542 Cervicalgia: Secondary | ICD-10-CM | POA: Diagnosis not present

## 2017-11-06 DIAGNOSIS — J42 Unspecified chronic bronchitis: Secondary | ICD-10-CM | POA: Diagnosis not present

## 2017-11-06 DIAGNOSIS — L84 Corns and callosities: Secondary | ICD-10-CM | POA: Diagnosis not present

## 2017-11-06 DIAGNOSIS — R059 Cough, unspecified: Secondary | ICD-10-CM

## 2017-11-06 MED ORDER — DOXYCYCLINE HYCLATE 100 MG PO TABS: 100.0000 mg | ORAL_TABLET | Freq: Two times a day (BID) | ORAL | 0 refills | Status: DC

## 2017-11-06 MED ORDER — HYDROCODONE-HOMATROPINE 5-1.5 MG/5ML PO SYRP: 5.0000 mL | ORAL_SOLUTION | Freq: Every evening | ORAL | 0 refills | Status: DC | PRN

## 2017-11-06 NOTE — Patient Instructions (Addendum)
Try OTC Tumeric daily for the neck pain. Go across the street at [facility] patient [facility] for your X-rays, you do not need an order. We will call you with the results.

## 2017-11-06 NOTE — Progress Notes (Signed)
Patient: Robert Patton Male    DOB: 09/05/1950   67 y.o.   MRN: [id number] Visit Date: 11/06/2017  Today's Provider: [name], MD   Chief Complaint  Patient presents with  . Cough   Subjective:    HPI Pt is here today for cough. He reports that it has been going on for 3 weeks. He reports that he also has sneezing, runny nose, and post nasal drainage. Pt reports that the cough is worse at night. Denies fever, chills, shortness of breath. He also reports that he has some neck pain and his left little toe hurts. He has a hard white spot on the edge of his toe. No pain down his arms.    Allergies  Allergen Reactions  . Amlodipine Besylate     Insomnia  . Diltiazem     Had issues sexually     Current Outpatient Medications:  .  acetaminophen (TYLENOL) 500 MG tablet, Take 1,000 mg by mouth every 6 (six) hours as needed (FOR PAIN.)., Disp: , Rfl:  .  aspirin EC 81 MG tablet, Take 81 mg by mouth daily., Disp: , Rfl:  .  Calcium-Magnesium-Zinc (CAL-MAG-ZINC PO), Take 1 tablet by mouth daily., Disp: , Rfl:  .  carboxymethylcellulose (REFRESH) 1 % ophthalmic solution, Place 1-2 drops into both eyes 2 (two) times daily., Disp: , Rfl:  .  hydrochlorothiazide (HYDRODIURIL) 25 MG tablet, Take 1 tablet (25 mg total) by mouth daily., Disp: 90 tablet, Rfl: 3 .  losartan (COZAAR) 100 MG tablet, TAKE 1 TABLET(100 MG) BY MOUTH DAILY, Disp: 90 tablet, Rfl: 0 .  Multiple Vitamin (MULTIVITAMIN WITH MINERALS) TABS tablet, Take 1 tablet by mouth daily., Disp: , Rfl:  .  sildenafil (REVATIO) 20 MG tablet, 1 to 4 tablets daily as needed, Disp: 20 tablet, Rfl: 11 .  Vitamin D, Ergocalciferol, (DRISDOL) 50000 units CAPS capsule, TAKE 1 CAPSULE BY MOUTH WEEKLY, Disp: 12 capsule, Rfl: 3 .  acetaminophen-codeine (TYLENOL #3) 300-30 MG tablet, Take 1-2 tablets by mouth every 4 (four) hours as needed. (Patient not taking: Reported on 11/06/2017), Disp: 20 tablet, Rfl: 2 .  cephALEXin (KEFLEX) 500  MG capsule, Take 1 capsule (500 mg total) by mouth 4 (four) times daily. (Patient not taking: Reported on 11/06/2017), Disp: 28 capsule, Rfl: 0 .  docusate sodium (COLACE) 100 MG capsule, Take 2 capsules (200 mg total) by mouth 2 (two) times daily. (Patient not taking: Reported on 11/06/2017), Disp: 120 capsule, Rfl: 3 .  fluticasone (FLONASE) 50 MCG/ACT nasal spray, Place 2 sprays into both nostrils daily. (Patient not taking: Reported on 05/14/2017), Disp: 16 g, Rfl: 1 .  meloxicam (MOBIC) 7.5 MG tablet, Take 1 tablet (7.5 mg total) by mouth daily as needed for pain. (Patient not taking: Reported on 11/06/2017), Disp: 30 tablet, Rfl: 5 .  NUCYNTA 50 MG tablet, Take 1 tablet (50 mg total) by mouth every 6 (six) hours as needed for severe pain. 1 TO 2 TABS Q 6 HOURS PRN PAIN (Patient not taking: Reported on 11/06/2017), Disp: 20 tablet, Rfl: 0 .  ranitidine (ZANTAC) 150 MG capsule, Take 1 capsule (150 mg total) by mouth 2 (two) times daily. (Patient not taking: Reported on 07/18/2017), Disp: 60 capsule, Rfl: 12  Review of Systems  Constitutional: Negative.   HENT: Positive for congestion, postnasal drip, rhinorrhea and sneezing.   Eyes: Negative.   Respiratory: Positive for cough.   Cardiovascular: Negative.   Gastrointestinal: Negative.   Endocrine: Negative.  Genitourinary: Negative.   Musculoskeletal: Positive for arthralgias, neck pain and neck stiffness.  Skin: Negative.   Allergic/Immunologic: Negative.   Neurological: Negative.   Hematological: Negative.   Psychiatric/Behavioral: Negative.     Social History   Tobacco Use  . Smoking status: Never Smoker  . Smokeless tobacco: Never Used  Substance Use Topics  . Alcohol use: No   Objective:   BP (!) 162/84 (BP Location: Right Arm, Patient Position: Sitting, Cuff Size: Normal)   Pulse 80   Temp 98.3 F (36.8 C) (Oral)   Resp 16   Wt 168 lb (76.2 kg)   SpO2 96%   BMI 24.11 kg/m  Vitals:   11/06/17 1111  BP: (!) 162/84    Pulse: 80  Resp: 16  Temp: 98.3 F (36.8 C)  TempSrc: Oral  SpO2: 96%  Weight: 168 lb (76.2 kg)     Physical Exam  Constitutional: He is oriented to person, place, and time. He appears well-developed and well-nourished.  HENT:  Head: Normocephalic and atraumatic.  Right Ear: External ear normal.  Left Ear: External ear normal.  Nose: Nose normal.  Mouth/Throat: Oropharynx is clear and moist.  Eyes: Conjunctivae are normal. No scleral icterus.  Neck: No thyromegaly present.  Cardiovascular: Normal rate, regular rhythm and normal heart sounds.  Pulmonary/Chest: Effort normal and breath sounds normal.  Abdominal: Soft.  Musculoskeletal: He exhibits no edema.  Lymphadenopathy:    He has no cervical adenopathy.  Neurological: He is alert and oriented to person, place, and time.  Grossly nonfocal.  Skin: Skin is warm and dry.  Corn on lateral left 5th toe.  Psychiatric: He has a normal mood and affect. His behavior is normal. Judgment and thought content normal.        Assessment & Plan:     1. Cough  - DG Chest 2 View; Future - HYDROcodone-homatropine (HYCODAN) 5-1.5 MG/5ML syrup; Take 5 mLs by mouth at bedtime as needed for cough.  Dispense: 120 mL; Refill: 0  2. Chronic bronchitis, unspecified chronic bronchitis type (HCC)  - doxycycline (VIBRA-TABS) 100 MG tablet; Take 1 tablet (100 mg total) by mouth 2 (two) times daily.  Dispense: 20 tablet; Refill: 0  3. Neck pain OA/DDD. - DG Cervical Spine Complete; Future  4. Corn of toe  - Ambulatory referral to Podiatry      I have done the exam and reviewed the above chart and it is accurate to the best of my knowledge. Development worker, community has been used in this note in any air is in the dictation or transcription are unintentional.  [name], MD  [place]

## 2017-11-07 ENCOUNTER — Ambulatory Visit
Admission: RE | Admit: 2017-11-07 | Discharge: 2017-11-07 | Disposition: A | Discharge status: Home / Self Care | Payer: Commercial Managed Care - PPO | Source: Ambulatory Visit | Attending: Family Medicine | Admitting: Family Medicine

## 2017-11-07 DIAGNOSIS — R05 Cough: Secondary | ICD-10-CM | POA: Diagnosis not present

## 2017-11-07 DIAGNOSIS — I998 Other disorder of circulatory system: Secondary | ICD-10-CM | POA: Diagnosis not present

## 2017-11-07 DIAGNOSIS — M47892 Other spondylosis, cervical region: Secondary | ICD-10-CM | POA: Diagnosis not present

## 2017-11-07 DIAGNOSIS — M542 Cervicalgia: Secondary | ICD-10-CM | POA: Diagnosis not present

## 2017-11-07 DIAGNOSIS — M4802 Spinal stenosis, cervical region: Secondary | ICD-10-CM | POA: Insufficient documentation

## 2017-11-07 DIAGNOSIS — R059 Cough, unspecified: Secondary | ICD-10-CM

## 2017-11-10 ENCOUNTER — Other Ambulatory Visit: Payer: Self-pay

## 2017-11-10 NOTE — Telephone Encounter (Signed)
error 

## 2017-11-19 ENCOUNTER — Telehealth: Payer: Self-pay | Admitting: *Deleted

## 2017-11-19 MED ORDER — BENZONATATE 100 MG PO CAPS: 100.0000 mg | ORAL_CAPSULE | Freq: Two times a day (BID) | ORAL | 0 refills | Status: DC | PRN

## 2017-11-19 NOTE — Telephone Encounter (Signed)
Patient called stating that his cough is not better. Patient was seen in office 11/06/2017. Patient also stated he was supposed to be referred to Podiatry and has not heard anything about that yet. Please advise?

## 2017-11-19 NOTE — Telephone Encounter (Signed)
Podiatry referral for a corn on toe.

## 2017-11-19 NOTE — Telephone Encounter (Signed)
Please advise 

## 2017-11-19 NOTE — Telephone Encounter (Signed)
The order has already been put in, they are behind.  What do you want to do about the cough

## 2017-11-19 NOTE — Telephone Encounter (Signed)
Tessalon or Albuterol MDI. thx

## 2017-12-03 ENCOUNTER — Telehealth: Payer: Self-pay | Admitting: Family Medicine

## 2017-12-03 NOTE — Telephone Encounter (Signed)
Advised patient to check with [facility] in [place]

## 2017-12-03 NOTE — Telephone Encounter (Signed)
Pt is traveling to [place] next month and wants to know if you recommends that he needs to get any vaccines before he goes..  Pt's call back is [phone]  Thank [name]

## 2017-12-04 DIAGNOSIS — M898X9 Other specified disorders of bone, unspecified site: Secondary | ICD-10-CM | POA: Diagnosis not present

## 2017-12-04 DIAGNOSIS — M2042 Other hammer toe(s) (acquired), left foot: Secondary | ICD-10-CM | POA: Diagnosis not present

## 2017-12-04 NOTE — Telephone Encounter (Signed)
Ok to write written Rx? Please advise.

## 2017-12-04 NOTE — Telephone Encounter (Signed)
Yes but I think only specialized travel clinics give this vaccine. I had to get a couple of years ago and that was true.

## 2017-12-04 NOTE — Telephone Encounter (Signed)
Written, waiting for Dr Darnell Level to sign

## 2017-12-04 NOTE — Telephone Encounter (Signed)
done

## 2017-12-04 NOTE — Telephone Encounter (Signed)
Pt checked with pharmacy and they told him he needs a prescription for vaccine for yellow fever.  [facility] does not have it so he will need a written script to take somewhere  Pt's CB# [phone]  Thanks teri

## 2017-12-10 ENCOUNTER — Telehealth: Payer: Self-pay | Admitting: Family Medicine

## 2017-12-10 DIAGNOSIS — R059 Cough, unspecified: Secondary | ICD-10-CM

## 2017-12-10 DIAGNOSIS — R05 Cough: Secondary | ICD-10-CM

## 2017-12-10 MED ORDER — HYDROCODONE-HOMATROPINE 5-1.5 MG/5ML PO SYRP: 5.0000 mL | ORAL_SOLUTION | Freq: Every evening | ORAL | 0 refills | Status: DC | PRN

## 2017-12-10 NOTE — Telephone Encounter (Signed)
Pt needs refill on his cough medication.  He still has a cough.from when he was in last and he is getting ready to go out of the country in the next month  He uses [facility] [address]  Pt's [place] # [phone]  [name]

## 2017-12-10 NOTE — Telephone Encounter (Signed)
Ok to refill but should he have cough reevaluated before he goes out of the country.?

## 2017-12-10 NOTE — Telephone Encounter (Signed)
Ok to refill? Please advise. Thanks!  

## 2017-12-10 NOTE — Telephone Encounter (Signed)
Medication was sent

## 2017-12-11 ENCOUNTER — Telehealth: Payer: Self-pay | Admitting: Family Medicine

## 2017-12-11 DIAGNOSIS — R059 Cough, unspecified: Secondary | ICD-10-CM

## 2017-12-11 DIAGNOSIS — R05 Cough: Secondary | ICD-10-CM

## 2017-12-11 NOTE — Telephone Encounter (Signed)
Dr. Rosanna Randy, can [facility] for [facility] patient. I printed it, but the patient does not want to come to the office to pick up hard copy. Hard copy was shredded. Thanks!

## 2017-12-11 NOTE — Telephone Encounter (Signed)
Patient is also aware that if cough continues, that he would need an OV for evaluation.

## 2017-12-11 NOTE — Telephone Encounter (Signed)
Pt stated that he would like to speak with a CMA about his cough. FYI: Dr. Rosanna Randy printed the Rx for HYDROcodone-homatropine [facility]) 5-1.5 MG/5ML syrup but didn't send it to the pharmacy. Please advise. Thanks TNP

## 2017-12-12 MED ORDER — HYDROCODONE-HOMATROPINE 5-1.5 MG/5ML PO SYRP
5.0000 mL | ORAL_SOLUTION | Freq: Every evening | ORAL | 0 refills | Status: DC | PRN
Start: 2017-12-12 — End: 2018-02-19

## 2017-12-12 NOTE — Telephone Encounter (Signed)
Please review for Dr. Gilbert  Thanks,   -Laura  

## 2017-12-12 NOTE — Telephone Encounter (Signed)
Pt stated that he called [facility] [address] and was advised they haven't received Rx for HYDROcodone-homatropine (HYCODAN) 5-1.5 MG/5ML syrup. Pt is requesting that the medication be sent to [facility]. Please advise. Thanks TNP

## 2017-12-12 NOTE — Telephone Encounter (Signed)
Resent to [facility] for Dr. Gery Pray reviewed and there had been no Rx picked up in the time frame ordered.

## 2017-12-23 ENCOUNTER — Telehealth: Payer: Self-pay | Admitting: Family Medicine

## 2017-12-23 DIAGNOSIS — I10 Essential (primary) hypertension: Secondary | ICD-10-CM

## 2017-12-23 MED ORDER — LOSARTAN POTASSIUM 100 MG PO TABS: ORAL_TABLET | ORAL | 3 refills | Status: DC

## 2017-12-23 NOTE — Telephone Encounter (Signed)
Pt needs refill on his losartan 100mg   [facility] [address] [facility]

## 2018-01-12 ENCOUNTER — Ambulatory Visit: Payer: Commercial Managed Care - PPO | Admitting: Family Medicine

## 2018-01-22 ENCOUNTER — Ambulatory Visit (INDEPENDENT_AMBULATORY_CARE_PROVIDER_SITE_OTHER): Payer: Commercial Managed Care - PPO | Admitting: Family Medicine

## 2018-01-22 ENCOUNTER — Other Ambulatory Visit: Payer: Self-pay

## 2018-01-22 ENCOUNTER — Encounter: Payer: Self-pay | Admitting: Family Medicine

## 2018-01-22 VITALS — BP 132/70 | HR 64 | Temp 99.3°F | Resp 20 | Wt 169.0 lb

## 2018-01-22 DIAGNOSIS — R5383 Other fatigue: Secondary | ICD-10-CM

## 2018-01-22 DIAGNOSIS — M791 Myalgia, unspecified site: Secondary | ICD-10-CM | POA: Diagnosis not present

## 2018-01-22 DIAGNOSIS — M2550 Pain in unspecified joint: Secondary | ICD-10-CM | POA: Diagnosis not present

## 2018-01-22 DIAGNOSIS — R509 Fever, unspecified: Secondary | ICD-10-CM

## 2018-01-22 DIAGNOSIS — R0789 Other chest pain: Secondary | ICD-10-CM

## 2018-01-22 LAB — POCT URINALYSIS DIPSTICK
Bilirubin, UA: NEGATIVE
Blood, UA: NEGATIVE
GLUCOSE UA: NEGATIVE
KETONES UA: NEGATIVE
Leukocytes, UA: NEGATIVE
Nitrite, UA: NEGATIVE
Protein, UA: NEGATIVE
Spec Grav, UA: <=1.005 — AB (ref 1.010–1.025)
Urobilinogen, UA: 0.2 E.U./dL
pH, UA: 6 (ref 5.0–8.0)

## 2018-01-22 NOTE — Progress Notes (Signed)
     Patient: Robert Patton Male    DOB: 02/19/1951   67 y.o.   MRN: [id number] Visit Date: 01/22/2018  Today's Provider: Richard Gilbert Jr, MD   Chief Complaint  Patient presents with  . Generalized Body Aches   Subjective:    HPI Patient comes in today c/o body aches all over since yesterday. He reports that he also has joint pain. He also c/o low grade temp up to 99.5 this morning. Patient feels weak and lethargic. He has taken tylenol with minimal relief.   Patient reports that he just got back from [place] about 2 weeks ago. He denies feeling bad during his traveling.     Allergies  Allergen Reactions  . Amlodipine Besylate     Insomnia  . Diltiazem     Had issues sexually     Current Outpatient Medications:  .  acetaminophen (TYLENOL) 500 MG tablet, Take 1,000 mg by mouth every 6 (six) hours as needed (FOR PAIN.)., Disp: , Rfl:  .  aspirin EC 81 MG tablet, Take 81 mg by mouth daily., Disp: , Rfl:  .  Calcium-Magnesium-Zinc (CAL-MAG-ZINC PO), Take 1 tablet by mouth daily., Disp: , Rfl:  .  carboxymethylcellulose (REFRESH) 1 % ophthalmic solution, Place 1-2 drops into both eyes 2 (two) times daily., Disp: , Rfl:  .  hydrochlorothiazide (HYDRODIURIL) 25 MG tablet, Take 1 tablet (25 mg total) by mouth daily., Disp: 90 tablet, Rfl: 3 .  losartan (COZAAR) 100 MG tablet, TAKE 1 TABLET(100 MG) BY MOUTH DAILY, Disp: 90 tablet, Rfl: 3 .  Multiple Vitamin (MULTIVITAMIN WITH MINERALS) TABS tablet, Take 1 tablet by mouth daily., Disp: , Rfl:  .  sildenafil (REVATIO) 20 MG tablet, 1 to 4 tablets daily as needed, Disp: 20 tablet, Rfl: 11 .  Vitamin D, Ergocalciferol, (DRISDOL) 50000 units CAPS capsule, TAKE 1 CAPSULE BY MOUTH WEEKLY, Disp: 12 capsule, Rfl: 3 .  acetaminophen-codeine (TYLENOL #3) 300-30 MG tablet, Take 1-2 tablets by mouth every 4 (four) hours as needed. (Patient not taking: Reported on 11/06/2017), Disp: 20 tablet, Rfl: 2 .  benzonatate (TESSALON) 100 MG capsule, Take  1 capsule (100 mg total) by mouth 2 (two) times daily as needed for cough. (Patient not taking: Reported on 01/22/2018), Disp: 20 capsule, Rfl: 0 .  cephALEXin (KEFLEX) 500 MG capsule, Take 1 capsule (500 mg total) by mouth 4 (four) times daily. (Patient not taking: Reported on 11/06/2017), Disp: 28 capsule, Rfl: 0 .  docusate sodium (COLACE) 100 MG capsule, Take 2 capsules (200 mg total) by mouth 2 (two) times daily. (Patient not taking: Reported on 11/06/2017), Disp: 120 capsule, Rfl: 3 .  doxycycline (VIBRA-TABS) 100 MG tablet, Take 1 tablet (100 mg total) by mouth 2 (two) times daily. (Patient not taking: Reported on 01/22/2018), Disp: 20 tablet, Rfl: 0 .  fluticasone (FLONASE) 50 MCG/ACT nasal spray, Place 2 sprays into both nostrils daily. (Patient not taking: Reported on 05/14/2017), Disp: 16 g, Rfl: 1 .  HYDROcodone-homatropine (HYCODAN) 5-1.5 MG/5ML syrup, Take 5 mLs by mouth at bedtime as needed for cough. (Patient not taking: Reported on 01/22/2018), Disp: 120 mL, Rfl: 0 .  meloxicam (MOBIC) 7.5 MG tablet, Take 1 tablet (7.5 mg total) by mouth daily as needed for pain. (Patient not taking: Reported on 11/06/2017), Disp: 30 tablet, Rfl: 5 .  NUCYNTA 50 MG tablet, Take 1 tablet (50 mg total) by mouth every 6 (six) hours as needed for severe pain. 1 TO 2 TABS Q 6 HOURS   PRN PAIN (Patient not taking: Reported on 11/06/2017), Disp: 20 tablet, Rfl: 0 .  ranitidine (ZANTAC) 150 MG capsule, Take 1 capsule (150 mg total) by mouth 2 (two) times daily. (Patient not taking: Reported on 07/18/2017), Disp: 60 capsule, Rfl: 12  Review of Systems  Constitutional: Positive for activity change, chills, fatigue and fever.  HENT: Negative for congestion, sinus pressure, sinus pain and sore throat.   Eyes: Negative.   Respiratory: Negative for cough and shortness of breath.   Cardiovascular: Negative for chest pain, palpitations and leg swelling.  Endocrine: Negative.   Musculoskeletal: Positive for arthralgias,  myalgias, neck pain and neck stiffness.  Allergic/Immunologic: Negative for environmental allergies.  Neurological: Positive for headaches. Negative for dizziness, weakness and light-headedness.  Psychiatric/Behavioral: Negative.     Social History   Tobacco Use  . Smoking status: Never Smoker  . Smokeless tobacco: Never Used  Substance Use Topics  . Alcohol use: No   Objective:   BP 132/70 (BP Location: Left Arm, Patient Position: Sitting, Cuff Size: Normal)   Pulse 64   Temp 99.3 F (37.4 C)   Resp 20   Wt 169 lb (76.7 kg)   SpO2 96%   BMI 24.25 kg/m  Vitals:   01/22/18 1053  BP: 132/70  Pulse: 64  Resp: 20  Temp: 99.3 F (37.4 C)  SpO2: 96%  Weight: 169 lb (76.7 kg)     Physical Exam  Constitutional: He is oriented to person, place, and time. He appears well-developed and well-nourished.  HENT:  Head: Normocephalic and atraumatic.  Eyes: Conjunctivae are normal. No scleral icterus.  Neck: Normal range of motion. Neck supple. No thyromegaly present.  Cardiovascular: Normal rate, regular rhythm and normal heart sounds.  Pulmonary/Chest: Effort normal and breath sounds normal.  Abdominal: Soft.  Musculoskeletal: He exhibits no edema.  Lymphadenopathy:    He has no cervical adenopathy.  Neurological: He is alert and oriented to person, place, and time.  Skin: Skin is warm and dry.  Psychiatric: He has a normal mood and affect. His behavior is normal. Judgment and thought content normal.        Assessment & Plan:     1. Fever, unspecified fever cause Viral etiology most likely.May have to consider travel issues from [place] if clinically warranted. - CBC with Differential/Platelet - POCT urinalysis dipstick - Urine Culture  2. Myalgia  - Sed Rate (ESR) - CK  3. Arthralgia, unspecified joint  - Sed Rate (ESR)  4. Other fatigue  - CBC with Differential/Platelet - Comprehensive metabolic panel - TSH  5. Chest wall pain  - DG Chest 2 View;  Future       I have done the exam and reviewed the above chart and it is accurate to the best of my knowledge. Development worker, community has been used in this note in any air is in the dictation or transcription are unintentional.  [name], MD  [place]

## 2018-01-22 NOTE — Patient Instructions (Signed)
Continue Tylenol every 4 hours as needed. Get plenty of rest and increase fluids.

## 2018-01-23 ENCOUNTER — Ambulatory Visit
Admission: RE | Admit: 2018-01-23 | Discharge: 2018-01-23 | Disposition: A | Discharge status: Home / Self Care | Payer: Commercial Managed Care - PPO | Source: Ambulatory Visit | Attending: Family Medicine | Admitting: Family Medicine

## 2018-01-23 ENCOUNTER — Telehealth: Payer: Self-pay | Admitting: Family Medicine

## 2018-01-23 DIAGNOSIS — R0789 Other chest pain: Secondary | ICD-10-CM

## 2018-01-23 LAB — CBC WITH DIFFERENTIAL/PLATELET
BASOS ABS: 0 10*3/uL (ref 0.0–0.2)
Basos: 1 %
EOS (ABSOLUTE): 0.1 10*3/uL (ref 0.0–0.4)
EOS: 1 %
HEMATOCRIT: 38.4 % (ref 37.5–51.0)
HEMOGLOBIN: 12.8 g/dL — AB (ref 13.0–17.7)
Immature Grans (Abs): 0 10*3/uL (ref 0.0–0.1)
Immature Granulocytes: 0 %
LYMPHS ABS: 0.9 10*3/uL (ref 0.7–3.1)
Lymphs: 22 %
MCH: 28 pg (ref 26.6–33.0)
MCHC: 33.3 g/dL (ref 31.5–35.7)
MCV: 84 fL (ref 79–97)
MONOCYTES: 16 %
Monocytes Absolute: 0.6 10*3/uL (ref 0.1–0.9)
NEUTROS ABS: 2.5 10*3/uL (ref 1.4–7.0)
Neutrophils: 60 %
PLATELETS: 178 10*3/uL (ref 150–450)
RBC: 4.57 x10E6/uL (ref 4.14–5.80)
RDW: 13.7 % (ref 12.3–15.4)
WBC: 4.1 10*3/uL (ref 3.4–10.8)

## 2018-01-23 LAB — COMPREHENSIVE METABOLIC PANEL
ALT: 15 IU/L (ref 0–44)
AST: 19 IU/L (ref 0–40)
Albumin/Globulin Ratio: 1.4 (ref 1.2–2.2)
Albumin: 4 g/dL (ref 3.6–4.8)
Alkaline Phosphatase: 92 IU/L (ref 39–117)
BUN/Creatinine Ratio: 13 (ref 10–24)
BUN: 14 mg/dL (ref 8–27)
CHLORIDE: 101 mmol/L (ref 96–106)
CO2: 25 mmol/L (ref 20–29)
Calcium: 8.8 mg/dL (ref 8.6–10.2)
Creatinine, Ser: 1.1 mg/dL (ref 0.76–1.27)
GFR calc non Af Amer: 69 mL/min/{1.73_m2} (ref >59)
GFR, EST AFRICAN AMERICAN: 79 mL/min/{1.73_m2} (ref >59)
Globulin, Total: 2.9 g/dL (ref 1.5–4.5)
Glucose: 72 mg/dL (ref 65–99)
POTASSIUM: 4.1 mmol/L (ref 3.5–5.2)
Sodium: 139 mmol/L (ref 134–144)
TOTAL PROTEIN: 6.9 g/dL (ref 6.0–8.5)

## 2018-01-23 LAB — TSH: TSH: 0.481 u[IU]/mL (ref 0.450–4.500)

## 2018-01-23 LAB — CK: CK TOTAL: 259 U/L — AB (ref 24–204)

## 2018-01-23 LAB — SEDIMENTATION RATE: SED RATE: 19 mm/h (ref 0–30)

## 2018-01-23 NOTE — Telephone Encounter (Signed)
Patient advised as directive below.  Thanks,  -Joseline

## 2018-01-23 NOTE — Telephone Encounter (Signed)
Hey Dr.B can you review this for this patient. He is a doctor Surveyor, quantity patient but as you know he is not here today and it was routed to [place] for [place] to review but she is also not here. Please. And can you route it back to [facility] pool please.  Thanks.

## 2018-01-23 NOTE — Telephone Encounter (Signed)
LMTCB 01/23/2018  Thanks,   -Mickel Baas

## 2018-01-23 NOTE — Telephone Encounter (Signed)
I looked at his labs and they are all normal.  There is no leukocytosis.  Talked to Judson Roch and seems patient is confused and disoriented.  He will be triaged, but if that is the case, h may need to be seen in the ED for AMS.  Virginia Crews, MD, MPH [facility] 01/23/2018 10:46 AM

## 2018-01-23 NOTE — Telephone Encounter (Signed)
Patient C/O body ache and wants to know what other medication he can take other than Tylenol. Patient reports last dose of Tylenol was around 7 am this morning, pt reports no symptom improvement. Patient denies any fever, cough, sore throat, no vomiting or abdominal pain. Patient reports just body ache all over. Dr. Rosanna Randy ordered a chest x-ray which patient was not aware or misunderstood. Patient reports he will call his wife, who is at work, to take in for x-ray.

## 2018-01-23 NOTE — Telephone Encounter (Signed)
Pt states he was seen yesterday and had labs done, per provider pending lab results he would get medication.  He is stating he is still not doing well and needs medication.

## 2018-01-23 NOTE — Telephone Encounter (Signed)
OK. I'll be on the lookout for CXR.  He can alternate tylenol and ibuprofen/Aleve for body aches.  Virginia Crews, MD, MPH [facility] 01/23/2018 11:46 AM

## 2018-01-24 LAB — URINE CULTURE: ORGANISM ID, BACTERIA: NO GROWTH

## 2018-02-04 ENCOUNTER — Telehealth: Payer: Self-pay | Admitting: Family Medicine

## 2018-02-04 NOTE — Telephone Encounter (Signed)
Please review. Thanks!  

## 2018-02-04 NOTE — Telephone Encounter (Signed)
Pt had his last CPE on 02/04/17 and is requesting that he be worked in for a CPE in September with Dr. Rosanna Randy because he doesn't want to wait until December to get his physical this year. Can pt be worked in with Dr. Rosanna Randy or can pt see another provider for his physical this year? Please advise. Thanks TNP

## 2018-02-05 NOTE — Telephone Encounter (Signed)
Robert Patton agreed to see pt since pt stated today that his insurance requires him to get a physical before 02/23/18. Pt is scheduled with [name] on 02/19/18 @ 10 am. Thanks TNP

## 2018-02-05 NOTE — Telephone Encounter (Signed)
I do not have openings earlier.

## 2018-02-10 ENCOUNTER — Telehealth: Payer: Self-pay | Admitting: Family Medicine

## 2018-02-10 NOTE — Telephone Encounter (Signed)
Pt is requesting Dr. Rosanna Randy to return his call. Pt stated that he wants to speak with Dr. Rosanna Randy because he is mad about not being able to get his physical with Dr. Rosanna Randy with Dr. Rosanna Randy in September. I explained to pt that a message was sent and Dr. Rosanna Randy doesn't have any physicals open in September. Pt stated that he wants to discuss this with Dr. Rosanna Randy. Please advise. Thanks TNP

## 2018-02-10 NOTE — Telephone Encounter (Signed)
Please review. Thanks!  

## 2018-02-19 ENCOUNTER — Encounter: Payer: Self-pay | Admitting: Physician Assistant

## 2018-02-19 ENCOUNTER — Ambulatory Visit (INDEPENDENT_AMBULATORY_CARE_PROVIDER_SITE_OTHER): Payer: Commercial Managed Care - PPO | Admitting: Physician Assistant

## 2018-02-19 VITALS — BP 160/96 | HR 56 | Temp 98.7°F | Resp 16 | Ht 70.0 in | Wt 166.0 lb

## 2018-02-19 DIAGNOSIS — Z0000 Encounter for general adult medical examination without abnormal findings: Secondary | ICD-10-CM

## 2018-02-19 DIAGNOSIS — I10 Essential (primary) hypertension: Secondary | ICD-10-CM

## 2018-02-19 DIAGNOSIS — N400 Enlarged prostate without lower urinary tract symptoms: Secondary | ICD-10-CM | POA: Diagnosis not present

## 2018-02-19 DIAGNOSIS — Z23 Encounter for immunization: Secondary | ICD-10-CM | POA: Diagnosis not present

## 2018-02-19 DIAGNOSIS — R972 Elevated prostate specific antigen [PSA]: Secondary | ICD-10-CM

## 2018-02-19 DIAGNOSIS — E559 Vitamin D deficiency, unspecified: Secondary | ICD-10-CM

## 2018-02-19 NOTE — Patient Instructions (Signed)

## 2018-02-19 NOTE — Progress Notes (Signed)
Patient: Robert Patton, Male    DOB: Oct 15, 1950, 67 y.o.   MRN: [id number] Visit Date: 02/19/2018  Today's Provider: Mar Daring, PA-C   Chief Complaint  Patient presents with  . Annual Exam   Subjective:    Annual physical exam Robert Patton is a 67 y.o. male who presents today for health maintenance and complete physical. He feels well. He reports exercising daily. He reports he is sleeping fairly well.  02/04/17 CPE 02/12/17 Colonoscopy 02/04/17 PSA-6.100 -----------------------------------------------------------------   Review of Systems  Constitutional: Negative.   HENT: Negative.   Eyes: Negative.   Respiratory: Negative.   Cardiovascular: Negative.   Gastrointestinal: Negative.   Endocrine: Negative.   Genitourinary: Negative.   Musculoskeletal: Negative.   Skin: Negative.   Allergic/Immunologic: Negative.   Neurological: Negative.   Hematological: Negative.   Psychiatric/Behavioral: Negative.     Social History      He  reports that he has never smoked. He has never used smokeless tobacco. He reports that he does not drink alcohol or use drugs.       Social History   Socioeconomic History  . Marital status: Married    Spouse name: Not on file  . Number of children: Not on file  . Years of education: Not on file  . Highest education level: Not on file  Occupational History  . Not on file  Social Needs  . Financial resource strain: Not on file  . Food insecurity:    Worry: Not on file    Inability: Not on file  . Transportation needs:    Medical: Not on file    Non-medical: Not on file  Tobacco Use  . Smoking status: Never Smoker  . Smokeless tobacco: Never Used  Substance and Sexual Activity  . Alcohol use: No  . Drug use: No  . Sexual activity: Not on file  Lifestyle  . Physical activity:    Days per week: Not on file    Minutes per session: Not on file  . Stress: Not on file  Relationships  . Social connections:    Talks  on phone: Not on file    Gets together: Not on file    Attends religious service: Not on file    Active member of club or organization: Not on file    Attends meetings of clubs or organizations: Not on file    Relationship status: Not on file  Other Topics Concern  . Not on file  Social History Narrative  . Not on file    Past Medical History:  Diagnosis Date  . Hyperlipidemia   . Hypertension      Patient Active Problem List   Diagnosis Date Noted  . Adaptation reaction 12/21/2015  . Allergic rhinitis 12/21/2015  . Back pain, chronic 12/21/2015  . 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 Cervical disc disease 12/21/2015  . History of chicken pox 12/21/2015  . 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 Hernia, inguinal, unilateral 01/02/2009  . Avitaminosis D 09/12/2008  . Essential (primary) hypertension 05/28/2003    Past Surgical History:  Procedure Laterality Date  . COLONOSCOPY  08/15/2009  . EYE SURGERY Right 2016   CATARACT EXTRACTION  . INGUINAL HERNIA REPAIR Right 07/22/2017   Procedure: HERNIA REPAIR INGUINAL ADULT;  Surgeon: Royston Cowper, MD;  Location: [facility] ORS;  Service: Urology;  Laterality: Right;  . NO PAST SURGERIES      Family History  Family Status  Relation Name Status  . Mother  Deceased at age 45  . Father  Deceased at age 47  . Sister  Alive  . Brother  Alive  . Sister  Alive  . Sister  Alive  . Brother  Alive        His family history includes Hypertension in his brother.      Allergies  Allergen Reactions  . Amlodipine Besylate     Insomnia  . Diltiazem     Had issues sexually     Current Outpatient Medications:  .  acetaminophen (TYLENOL) 500 MG tablet, Take 1,000 mg by mouth every 6 (six) hours as needed (FOR PAIN.)., Disp: , Rfl:  .  aspirin EC 81 MG tablet, Take 81 mg by mouth daily., Disp: , Rfl:  .  Calcium-Magnesium-Zinc (CAL-MAG-ZINC PO), Take 1 tablet by mouth daily., Disp: , Rfl:  .   carboxymethylcellulose (REFRESH) 1 % ophthalmic solution, Place 1-2 drops into both eyes 2 (two) times daily., Disp: , Rfl:  .  hydrochlorothiazide (HYDRODIURIL) 25 MG tablet, Take 1 tablet (25 mg total) by mouth daily., Disp: 90 tablet, Rfl: 3 .  losartan (COZAAR) 100 MG tablet, TAKE 1 TABLET(100 MG) BY MOUTH DAILY, Disp: 90 tablet, Rfl: 3 .  Multiple Vitamin (MULTIVITAMIN WITH MINERALS) TABS tablet, Take 1 tablet by mouth daily., Disp: , Rfl:  .  sildenafil (REVATIO) 20 MG tablet, 1 to 4 tablets daily as needed, Disp: 20 tablet, Rfl: 11 .  Vitamin D, Ergocalciferol, (DRISDOL) 50000 units CAPS capsule, TAKE 1 CAPSULE BY MOUTH WEEKLY, Disp: 12 capsule, Rfl: 3   Patient Care Team: Jerrol Banana., MD as PCP - General (Family Medicine) Jerrol Banana., MD (Family Medicine) Christene Lye, MD (General Surgery)      Objective:   Vitals: BP (!) 160/96 (BP Location: Left Arm, Patient Position: Sitting, Cuff Size: Normal)   Pulse (!) 56   Temp 98.7 F (37.1 C) (Oral)   Resp 16   Ht 5\' 10"  (1.778 m)   Wt 166 lb (75.3 kg)   SpO2 98%   BMI 23.82 kg/m    Vitals:   02/19/18 0954  BP: (!) 160/96  Pulse: (!) 56  Resp: 16  Temp: 98.7 F (37.1 C)  TempSrc: Oral  SpO2: 98%  Weight: 166 lb (75.3 kg)  Height: 5\' 10"  (1.778 m)     Physical Exam  Constitutional: He is oriented to person, place, and time. He appears well-developed and well-nourished.  HENT:  Head: Normocephalic and atraumatic.  Right Ear: Hearing, tympanic membrane, external ear and ear canal normal.  Left Ear: Hearing, tympanic membrane, external ear and ear canal normal.  Nose: Nose normal.  Mouth/Throat: Uvula is midline, oropharynx is clear and moist and mucous membranes are normal.    Eyes: Pupils are equal, round, and reactive to light. Conjunctivae and EOM are normal. Right eye exhibits no discharge.  Neck: Normal range of motion. Neck supple. Carotid bruit is not present. No tracheal  deviation present. No thyromegaly present.  Cardiovascular: Normal rate, regular rhythm, normal heart sounds and intact distal pulses.  No murmur heard. Pulmonary/Chest: Effort normal and breath sounds normal. No respiratory distress. He has no wheezes. He has no rales. He exhibits no tenderness.  Abdominal: Soft. Bowel sounds are normal. He exhibits no distension and no mass. There is no tenderness. There is no rebound and no guarding.  Musculoskeletal: Normal range of motion. He exhibits no edema or tenderness.  Lymphadenopathy:    He has no cervical adenopathy.  Neurological: He is alert and oriented to person, place, and time. He has normal reflexes. He displays normal reflexes. No cranial nerve deficit. He exhibits normal muscle tone. Coordination normal.  Skin: Skin is warm and dry. No rash noted. No erythema.  Psychiatric: He has a normal mood and affect. His behavior is normal. Judgment and thought content normal.  Vitals reviewed.    Depression Screen PHQ 2/9 Scores 02/19/2018 02/04/2017 12/21/2015  PHQ - 2 Score 0 0 0  PHQ- 9 Score 0 0 -      Assessment & Plan:     Routine Health Maintenance and Physical Exam  Exercise Activities and Dietary recommendations Goals   None     Immunization History  Administered Date(s) Administered  . Influenza Split 04/08/2011  . Pneumococcal Conjugate-13 07/20/2014  . Tdap 04/08/2011  . Zoster 12/24/2011    Health Maintenance  Topic Date Due  . PNA vac Low Risk Adult (2 of 2 - PPSV23) 07/21/2015  . INFLUENZA VACCINE  12/25/2017  . COLONOSCOPY  08/16/2019  . TETANUS/TDAP  04/07/2021  . Hepatitis C Screening  Completed     Discussed health benefits of physical activity, and encouraged him to engage in regular exercise appropriate for his age and condition.    1. Annual physical exam Normal physical exam today. Will check labs as below and f/u pending lab results. If labs are stable and WNL he will not need to have these  rechecked for one year at his next annual physical exam. He is to call the office in the meantime if she has any acute issue, questions or concerns. - CBC with Differential/Platelet - Comprehensive metabolic panel - Lipid Panel With LDL/HDL Ratio - TSH  2. Essential (primary) hypertension Elevated today but reports normal readings at home. Continue Losartan 100mg  daily. Will check labs as below and f/u pending results. - CBC with Differential/Platelet - Comprehensive metabolic panel - Lipid Panel With LDL/HDL Ratio - TSH - HgB A1c  3. Elevated PSA H/O this last year. Was to have repeated but patient did not have done.  - PSA  4. Benign fibroma of prostate Stable. No urinary changes.  5. Avitaminosis D H/O this. No supplementation. Will check labs as below and f/u pending results. - Vitamin D (25 hydroxy)  6. Need for 23-polyvalent pneumococcal polysaccharide vaccine Pneumococcal 23 Vaccine given to patient without complications. Patient sat for 15 minutes after administration and was tolerated well without adverse effects. - Pneumococcal polysaccharide vaccine 23-valent greater than or equal to 2yo subcutaneous/IM  7. Need for influenza vaccination Flu vaccine given today without complication. Patient sat upright for 15 minutes to check for adverse reaction before being released. - Flu vaccine HIGH DOSE PF  --------------------------------------------------------------------    Mar Daring, PA-C  [facility]

## 2018-02-20 ENCOUNTER — Telehealth: Payer: Self-pay

## 2018-02-20 DIAGNOSIS — R972 Elevated prostate specific antigen [PSA]: Secondary | ICD-10-CM

## 2018-02-20 LAB — COMPREHENSIVE METABOLIC PANEL
ALK PHOS: 73 IU/L (ref 39–117)
ALT: 15 IU/L (ref 0–44)
AST: 17 IU/L (ref 0–40)
Albumin/Globulin Ratio: 1.5 (ref 1.2–2.2)
Albumin: 4.3 g/dL (ref 3.6–4.8)
BUN/Creatinine Ratio: 14 (ref 10–24)
BUN: 14 mg/dL (ref 8–27)
Bilirubin Total: 0.4 mg/dL (ref 0.0–1.2)
CALCIUM: 9.2 mg/dL (ref 8.6–10.2)
CO2: 24 mmol/L (ref 20–29)
CREATININE: 1.03 mg/dL (ref 0.76–1.27)
Chloride: 101 mmol/L (ref 96–106)
GFR calc Af Amer: 86 mL/min/{1.73_m2} (ref >59)
GFR calc non Af Amer: 74 mL/min/{1.73_m2} (ref >59)
GLUCOSE: 86 mg/dL (ref 65–99)
Globulin, Total: 2.9 g/dL (ref 1.5–4.5)
Potassium: 4 mmol/L (ref 3.5–5.2)
SODIUM: 140 mmol/L (ref 134–144)
Total Protein: 7.2 g/dL (ref 6.0–8.5)

## 2018-02-20 LAB — PSA: PROSTATE SPECIFIC AG, SERUM: 6.4 ng/mL — AB (ref 0.0–4.0)

## 2018-02-20 LAB — CBC WITH DIFFERENTIAL/PLATELET
BASOS ABS: 0 10*3/uL (ref 0.0–0.2)
Basos: 1 %
EOS (ABSOLUTE): 0.2 10*3/uL (ref 0.0–0.4)
Eos: 7 %
Hematocrit: 39.1 % (ref 37.5–51.0)
Hemoglobin: 12.8 g/dL — ABNORMAL LOW (ref 13.0–17.7)
IMMATURE GRANULOCYTES: 0 %
Immature Grans (Abs): 0 10*3/uL (ref 0.0–0.1)
LYMPHS ABS: 1.9 10*3/uL (ref 0.7–3.1)
Lymphs: 50 %
MCH: 27.4 pg (ref 26.6–33.0)
MCHC: 32.7 g/dL (ref 31.5–35.7)
MCV: 84 fL (ref 79–97)
MONOCYTES: 9 %
MONOS ABS: 0.3 10*3/uL (ref 0.1–0.9)
NEUTROS PCT: 33 %
Neutrophils Absolute: 1.2 10*3/uL — ABNORMAL LOW (ref 1.4–7.0)
PLATELETS: 202 10*3/uL (ref 150–450)
RBC: 4.68 x10E6/uL (ref 4.14–5.80)
RDW: 13.9 % (ref 12.3–15.4)
WBC: 3.7 10*3/uL (ref 3.4–10.8)

## 2018-02-20 LAB — LIPID PANEL WITH LDL/HDL RATIO
Cholesterol, Total: 174 mg/dL (ref 100–199)
HDL: 70 mg/dL (ref >39)
LDL CALC: 90 mg/dL (ref 0–99)
LDL/HDL RATIO: 1.3 ratio (ref 0.0–3.6)
Triglycerides: 71 mg/dL (ref 0–149)
VLDL CHOLESTEROL CAL: 14 mg/dL (ref 5–40)

## 2018-02-20 LAB — VITAMIN D 25 HYDROXY (VIT D DEFICIENCY, FRACTURES): Vit D, 25-Hydroxy: 54 ng/mL (ref 30.0–100.0)

## 2018-02-20 LAB — HEMOGLOBIN A1C
Est. average glucose Bld gHb Est-mCnc: 117 mg/dL
Hgb A1c MFr Bld: 5.7 % — ABNORMAL HIGH (ref 4.8–5.6)

## 2018-02-20 LAB — TSH: TSH: 0.952 u[IU]/mL (ref 0.450–4.500)

## 2018-02-20 NOTE — Telephone Encounter (Signed)
-----   Message from Mar Daring, [place] sent at 02/20/2018  1:29 PM EDT ----- All labs are within normal limits and stable with exception of PSA that has increased since last check last year. I would recommend a referral to Urology for further evaluation.  Thanks! -JB

## 2018-02-20 NOTE — Telephone Encounter (Signed)
Will refer back to Dr. Yves Dill for elevated PSA.

## 2018-02-20 NOTE — Telephone Encounter (Signed)
Patient advised as directed below. Patient agrees with referral.Per patient his Urologist is Dr. Yves Dill.?

## 2018-02-21 ENCOUNTER — Other Ambulatory Visit: Payer: Self-pay | Admitting: Family Medicine

## 2018-02-21 DIAGNOSIS — I10 Essential (primary) hypertension: Secondary | ICD-10-CM

## 2018-02-23 ENCOUNTER — Telehealth: Payer: Self-pay | Admitting: Family Medicine

## 2018-02-23 NOTE — Telephone Encounter (Signed)
Pt advised that lab results printed at front desk for pick up

## 2018-02-23 NOTE — Telephone Encounter (Signed)
Pt wanting to come by office to pick up a hard copy of his recent labs done. Please call pt to let him know when they are ready for pick up.  Thanks, [employer]

## 2018-03-20 DIAGNOSIS — R972 Elevated prostate specific antigen [PSA]: Secondary | ICD-10-CM | POA: Diagnosis not present

## 2018-06-18 ENCOUNTER — Other Ambulatory Visit: Payer: Self-pay | Admitting: Family Medicine

## 2018-06-18 MED ORDER — VITAMIN D (ERGOCALCIFEROL) 1.25 MG (50000 UNIT) PO CAPS: 50000.0000 [IU] | ORAL_CAPSULE | ORAL | 3 refills | Status: DC

## 2018-06-18 NOTE — Telephone Encounter (Signed)
[facility] faxed refill request for the following medications:  Vitamin D, Ergocalciferol, (DRISDOL) 50000 units CAPS capsule  Last Rx: 07/30/17 LOV: 02/19/18 Please advise. Thanks TNP

## 2018-06-18 NOTE — Telephone Encounter (Signed)
Please review. Thanks!  

## 2019-01-07 DIAGNOSIS — N401 Enlarged prostate with lower urinary tract symptoms: Secondary | ICD-10-CM | POA: Diagnosis not present

## 2019-01-07 DIAGNOSIS — N433 Hydrocele, unspecified: Secondary | ICD-10-CM | POA: Diagnosis not present

## 2019-01-07 DIAGNOSIS — N5201 Erectile dysfunction due to arterial insufficiency: Secondary | ICD-10-CM | POA: Diagnosis not present

## 2019-01-13 ENCOUNTER — Other Ambulatory Visit: Payer: Self-pay | Admitting: Family Medicine

## 2019-01-13 DIAGNOSIS — I10 Essential (primary) hypertension: Secondary | ICD-10-CM

## 2019-03-11 ENCOUNTER — Ambulatory Visit (INDEPENDENT_AMBULATORY_CARE_PROVIDER_SITE_OTHER): Payer: Commercial Managed Care - PPO | Admitting: Family Medicine

## 2019-03-11 ENCOUNTER — Other Ambulatory Visit: Payer: Self-pay

## 2019-03-11 ENCOUNTER — Other Ambulatory Visit: Payer: Self-pay | Admitting: Family Medicine

## 2019-03-11 ENCOUNTER — Encounter: Payer: Self-pay | Admitting: Family Medicine

## 2019-03-11 VITALS — BP 154/80 | HR 59 | Temp 97.5°F | Resp 16 | Ht 70.0 in | Wt 175.0 lb

## 2019-03-11 DIAGNOSIS — Z23 Encounter for immunization: Secondary | ICD-10-CM | POA: Diagnosis not present

## 2019-03-11 DIAGNOSIS — E782 Mixed hyperlipidemia: Secondary | ICD-10-CM

## 2019-03-11 DIAGNOSIS — Z0000 Encounter for general adult medical examination without abnormal findings: Secondary | ICD-10-CM | POA: Diagnosis not present

## 2019-03-11 DIAGNOSIS — I10 Essential (primary) hypertension: Secondary | ICD-10-CM

## 2019-03-11 DIAGNOSIS — R972 Elevated prostate specific antigen [PSA]: Secondary | ICD-10-CM | POA: Diagnosis not present

## 2019-03-11 LAB — POCT URINALYSIS DIPSTICK
Appearance: NORMAL
Bilirubin, UA: NEGATIVE
Blood, UA: NEGATIVE
Glucose, UA: NEGATIVE
Ketones, UA: NEGATIVE
Leukocytes, UA: NEGATIVE
Nitrite, UA: NEGATIVE
Odor: NORMAL
Protein, UA: NEGATIVE
Spec Grav, UA: 1.01 (ref 1.010–1.025)
Urobilinogen, UA: 0.2 E.U./dL
pH, UA: 7 (ref 5.0–8.0)

## 2019-03-11 MED ORDER — HYDROCHLOROTHIAZIDE 25 MG PO TABS: ORAL_TABLET | ORAL | 3 refills | Status: DC

## 2019-03-11 NOTE — Patient Instructions (Addendum)
1. Annual physical exam  - CBC with Differential/Platelet - Comprehensive metabolic panel - Lipid Panel With LDL/HDL Ratio - TSH  - POCT UA  2. Essential (primary) hypertension Bring in home blood pressure readings and cuff at follow up.  - CBC with Differential/Platelet - Comprehensive metabolic panel - Lipid Panel With LDL/HDL Ratio - TSH - POCT UA   3. Need for influenza vaccination Flu vaccine given today without complication. Patient sat upright for 15 minutes to check for adverse reaction before being released. - Flu vaccine HIGH DOSE PF  4. Sleep Disturbance   -try OTC melatonin 5 mg at bedtime  Follow up in 6 months

## 2019-03-11 NOTE — Telephone Encounter (Signed)
[facility] faxed refill request for the following medications:  hydrochlorothiazide (HYDRODIURIL) 25 MG tablet    Please advise.  

## 2019-03-11 NOTE — Progress Notes (Signed)
Patient: Robert Patton, Male    DOB: 1951-04-25, 68 y.o.   MRN: [id number] Visit Date: 03/11/2019  Today's Provider: [name], MD   Chief Complaint  Patient presents with  . Annual Exam   Subjective:     Annual physical exam Robert Patton is a 68 y.o. male who presents today for health maintenance and complete physical. He feels well. He reports exercising yes. He reports he is sleeping poorly. Pt is from [place],this is his second [name] has 3 children and 3 grandchildren. % grandchildren. -----------------------------------------------------------   Review of Systems  Constitutional: Negative.   HENT: Negative for congestion, sinus pressure, sinus pain and sore throat.   Eyes: Negative.   Respiratory: Negative for cough and shortness of breath.   Cardiovascular: Negative for chest pain, palpitations and leg swelling.  Gastrointestinal: Negative.   Endocrine: Negative.   Genitourinary: Negative.   Musculoskeletal: Positive for arthralgias, myalgias, neck pain and neck stiffness.  Allergic/Immunologic: Negative for environmental allergies.  Neurological: Positive for headaches. Negative for dizziness, weakness and light-headedness.  Psychiatric/Behavioral: Negative.     Social History      He  reports that he has never smoked. He has never used smokeless tobacco. He reports that he does not drink alcohol or use drugs.       Social History   Socioeconomic History  . Marital status: Married    Spouse name: Not on file  . Number of children: Not on file  . Years of education: Not on file  . Highest education level: Not on file  Occupational History  . Not on file  Social Needs  . Financial resource strain: Not on file  . Food insecurity    Worry: Not on file    Inability: Not on file  . Transportation needs    Medical: Not on file    Non-medical: Not on file  Tobacco Use  . Smoking status: Never Smoker  . Smokeless tobacco: Never Used   Substance and Sexual Activity  . Alcohol use: No  . Drug use: No  . Sexual activity: Not on file  Lifestyle  . Physical activity    Days per week: Not on file    Minutes per session: Not on file  . Stress: Not on file  Relationships  . Social Herbalist on phone: Not on file    Gets together: Not on file    Attends religious service: Not on file    Active member of club or organization: Not on file    Attends meetings of clubs or organizations: Not on file    Relationship status: Not on file  Other Topics Concern  . Not on file  Social History Narrative  . Not on file    Past Medical History:  Diagnosis Date  . Hyperlipidemia   . Hypertension      Patient Active Problem List   Diagnosis Date Noted  . Adaptation reaction 12/21/2015  . Allergic rhinitis 12/21/2015  . Back pain, chronic 12/21/2015  . 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 Cervical disc disease 12/21/2015  . History of chicken pox 12/21/2015  . 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 History of inguinal hernia repair 01/02/2009  . Avitaminosis D 09/12/2008  . Essential (primary) hypertension 05/28/2003    Past Surgical History:  Procedure Laterality Date  . COLONOSCOPY  08/15/2009  . EYE SURGERY Right 2016   CATARACT EXTRACTION  . INGUINAL HERNIA  REPAIR Right 07/22/2017   Procedure: HERNIA REPAIR INGUINAL ADULT;  Surgeon: Royston Cowper, MD;  Location: [facility] ORS;  Service: Urology;  Laterality: Right;  . NO PAST SURGERIES      Family History        Family Status  Relation Name Status  . Mother  Deceased at age 72  . Father  Deceased at age 58  . Sister  Alive  . Brother  Alive  . Sister  Alive  . Sister  Alive  . Brother  Alive        His family history includes Hypertension in his brother.      Allergies  Allergen Reactions  . Amlodipine Besylate     Insomnia  . Diltiazem     Had issues sexually     Current Outpatient Medications:  .   acetaminophen (TYLENOL) 500 MG tablet, Take 1,000 mg by mouth every 6 (six) hours as needed (FOR PAIN.)., Disp: , Rfl:  .  Calcium-Magnesium-Zinc (CAL-MAG-ZINC PO), Take 1 tablet by mouth daily., Disp: , Rfl:  .  carboxymethylcellulose (REFRESH) 1 % ophthalmic solution, Place 1-2 drops into both eyes 2 (two) times daily., Disp: , Rfl:  .  hydrochlorothiazide (HYDRODIURIL) 25 MG tablet, TAKE 1 TABLET(25 MG) BY MOUTH DAILY, Disp: 90 tablet, Rfl: 3 .  losartan (COZAAR) 100 MG tablet, TAKE 1 TABLET(100 MG) BY MOUTH DAILY, Disp: 90 tablet, Rfl: 0 .  Multiple Vitamin (MULTIVITAMIN WITH MINERALS) TABS tablet, Take 1 tablet by mouth daily., Disp: , Rfl:  .  Vitamin D, Ergocalciferol, (DRISDOL) 1.25 MG (50000 UT) CAPS capsule, Take 1 capsule (50,000 Units total) by mouth every 7 (seven) days., Disp: 12 capsule, Rfl: 3 .  aspirin EC 81 MG tablet, Take 81 mg by mouth daily., Disp: , Rfl:  .  sildenafil (REVATIO) 20 MG tablet, 1 to 4 tablets daily as needed (Patient not taking: Reported on 03/11/2019), Disp: 20 tablet, Rfl: 11   Patient Care Team: Jerrol Banana., MD as PCP - General (Family Medicine) Jerrol Banana., MD (Family Medicine) Christene Lye, MD (General Surgery)    Objective:    Vitals: BP (!) 154/80 (BP Location: Left Arm, Cuff Size: Large)   Pulse (!) 59   Temp (!) 97.5 F (36.4 C) (Other (Comment))   Resp 16   Ht 5\' 10"  (1.778 m)   Wt 175 lb (79.4 kg)   SpO2 95%   BMI 25.11 kg/m    Vitals:   03/11/19 0909 03/11/19 1006  BP: (!) 160/84 (!) 154/80  Pulse: (!) 59   Resp: 16   Temp: (!) 97.5 F (36.4 C)   TempSrc: Other (Comment)   SpO2: 95%   Weight: 175 lb (79.4 kg)   Height: 5\' 10"  (1.778 m)      Physical Exam Vitals signs reviewed.  Constitutional:      Appearance: He is well-developed.  HENT:     Head: Normocephalic and atraumatic.  Eyes:     General: No scleral icterus.    Conjunctiva/sclera: Conjunctivae normal.  Neck:      Musculoskeletal: Normal range of motion and neck supple.     Thyroid: No thyromegaly.  Cardiovascular:     Rate and Rhythm: Normal rate and regular rhythm.     Heart sounds: Normal heart sounds.  Pulmonary:     Effort: Pulmonary effort is normal.     Breath sounds: Normal breath sounds.  Abdominal:     Palpations: Abdomen is soft.  Lymphadenopathy:     Cervical: No cervical adenopathy.  Skin:    General: Skin is warm and dry.  Neurological:     General: No focal deficit present.     Mental Status: He is alert and oriented to person, place, and time.  Psychiatric:        Mood and Affect: Mood normal.        Behavior: Behavior normal.        Thought Content: Thought content normal.        Judgment: Judgment normal.      Depression Screen PHQ 2/9 Scores 03/11/2019 02/19/2018 02/04/2017 12/21/2015  PHQ - 2 Score 0 0 0 0  PHQ- 9 Score 5 0 0 -       Assessment & Plan:     Routine Health Maintenance and Physical Exam  Exercise Activities and Dietary recommendations Goals   None     Immunization History  Administered Date(s) Administered  . Fluad Quad(high Dose 65+) 03/11/2019  . Influenza Split 04/08/2011  . Influenza, High Dose Seasonal PF 02/19/2018  . Pneumococcal Conjugate-13 07/20/2014  . Pneumococcal Polysaccharide-23 02/19/2018  . Tdap 04/08/2011  . Zoster 12/24/2011    Health Maintenance  Topic Date Due  . COLONOSCOPY  08/16/2019  . TETANUS/TDAP  04/07/2021  . INFLUENZA VACCINE  Completed  . Hepatitis C Screening  Completed  . PNA vac Low Risk Adult  Completed     Discussed health benefits of physical activity, and encouraged him to engage in regular exercise appropriate for his age and condition.   1. Annual physical exam  - Lipid panel - TSH - CBC w/Diff/Platelet - Comprehensive Metabolic Panel (CMET) - POCT urinalysis dipstick  2. Need for influenza vaccination  - Flu Vaccine QUAD High Dose(Fluad)  3. Essential (primary) hypertension  Bring home BP readings. - Lipid panel - TSH - CBC w/Diff/Platelet - Comprehensive Metabolic Panel (CMET) - POCT urinalysis dipstick  4. Elevated PSA   5. Mixed hyperlipidemia  - Lipid panel - TSH - CBC w/Diff/Platelet - Comprehensive Metabolic Panel (CMET)  --------------------------------------------------------------------  I,[name],acting as a scribe for [name], MD.,have documented all relevant documentation on the behalf of [name], MD,as directed by  [name], MD while in the presence of [name], MD.   [name], MD  [facility]

## 2019-03-12 LAB — CBC WITH DIFFERENTIAL/PLATELET
Basophils Absolute: 0 10*3/uL (ref 0.0–0.2)
Basos: 1 %
EOS (ABSOLUTE): 0.3 10*3/uL (ref 0.0–0.4)
Eos: 6 %
Hematocrit: 40.2 % (ref 37.5–51.0)
Hemoglobin: 13.2 g/dL (ref 13.0–17.7)
Immature Grans (Abs): 0 10*3/uL (ref 0.0–0.1)
Immature Granulocytes: 0 %
Lymphocytes Absolute: 2 10*3/uL (ref 0.7–3.1)
Lymphs: 45 %
MCH: 27.8 pg (ref 26.6–33.0)
MCHC: 32.8 g/dL (ref 31.5–35.7)
MCV: 85 fL (ref 79–97)
Monocytes Absolute: 0.4 10*3/uL (ref 0.1–0.9)
Monocytes: 8 %
Neutrophils Absolute: 1.8 10*3/uL (ref 1.4–7.0)
Neutrophils: 40 %
Platelets: 199 10*3/uL (ref 150–450)
RBC: 4.74 x10E6/uL (ref 4.14–5.80)
RDW: 13.4 % (ref 11.6–15.4)
WBC: 4.4 10*3/uL (ref 3.4–10.8)

## 2019-03-12 LAB — COMPREHENSIVE METABOLIC PANEL
ALT: 15 IU/L (ref 0–44)
AST: 18 IU/L (ref 0–40)
Albumin/Globulin Ratio: 1.4 (ref 1.2–2.2)
Albumin: 4.2 g/dL (ref 3.8–4.8)
Alkaline Phosphatase: 82 IU/L (ref 39–117)
BUN/Creatinine Ratio: 14 (ref 10–24)
BUN: 15 mg/dL (ref 8–27)
Bilirubin Total: 0.4 mg/dL (ref 0.0–1.2)
CO2: 25 mmol/L (ref 20–29)
Calcium: 9.4 mg/dL (ref 8.6–10.2)
Chloride: 103 mmol/L (ref 96–106)
Creatinine, Ser: 1.11 mg/dL (ref 0.76–1.27)
GFR calc Af Amer: 78 mL/min/{1.73_m2} (ref >59)
GFR calc non Af Amer: 67 mL/min/{1.73_m2} (ref >59)
Globulin, Total: 3 g/dL (ref 1.5–4.5)
Glucose: 90 mg/dL (ref 65–99)
Potassium: 3.8 mmol/L (ref 3.5–5.2)
Sodium: 139 mmol/L (ref 134–144)
Total Protein: 7.2 g/dL (ref 6.0–8.5)

## 2019-03-12 LAB — LIPID PANEL
Chol/HDL Ratio: 2.4 ratio (ref 0.0–5.0)
Cholesterol, Total: 168 mg/dL (ref 100–199)
HDL: 69 mg/dL (ref >39)
LDL Chol Calc (NIH): 87 mg/dL (ref 0–99)
Triglycerides: 64 mg/dL (ref 0–149)
VLDL Cholesterol Cal: 12 mg/dL (ref 5–40)

## 2019-03-12 LAB — TSH: TSH: 1.57 u[IU]/mL (ref 0.450–4.500)

## 2019-04-13 ENCOUNTER — Other Ambulatory Visit: Payer: Self-pay | Admitting: Family Medicine

## 2019-04-13 DIAGNOSIS — I10 Essential (primary) hypertension: Secondary | ICD-10-CM

## 2019-05-12 ENCOUNTER — Ambulatory Visit (INDEPENDENT_AMBULATORY_CARE_PROVIDER_SITE_OTHER): Payer: Commercial Managed Care - PPO | Admitting: Family Medicine

## 2019-05-12 ENCOUNTER — Other Ambulatory Visit: Payer: Self-pay

## 2019-05-12 ENCOUNTER — Encounter: Payer: Self-pay | Admitting: Family Medicine

## 2019-05-12 VITALS — BP 157/81 | HR 74 | Temp 97.4°F | Resp 18 | Ht 70.0 in | Wt 174.0 lb

## 2019-05-12 DIAGNOSIS — N529 Male erectile dysfunction, unspecified: Secondary | ICD-10-CM | POA: Diagnosis not present

## 2019-05-12 DIAGNOSIS — M545 Low back pain, unspecified: Secondary | ICD-10-CM

## 2019-05-12 DIAGNOSIS — G8929 Other chronic pain: Secondary | ICD-10-CM

## 2019-05-12 DIAGNOSIS — I10 Essential (primary) hypertension: Secondary | ICD-10-CM | POA: Diagnosis not present

## 2019-05-12 MED ORDER — AMLODIPINE BESYLATE 5 MG PO TABS: 5.0000 mg | ORAL_TABLET | Freq: Every day | ORAL | 3 refills | Status: DC

## 2019-05-12 NOTE — Progress Notes (Signed)
Patient: Robert Patton Male    DOB: 01/10/1951   68 y.o.   MRN: [id number] Visit Date: 05/12/2019  Today's Provider: [name], MD   Chief Complaint  Patient presents with  . Follow-up  . Hypertension   Subjective:     HPI  BP running high at home. Essential (primary) hypertension From 03/11/2019-Bring home BP readings. Labs good.  Patient is here to discuss his blood pressure. patient states bp has been slightly elevated the past few days 139/84-141/85.  Allergies  Allergen Reactions  . Amlodipine Besylate     Insomnia  . Diltiazem     Had issues sexually     Current Outpatient Medications:  .  Calcium-Magnesium-Zinc (CAL-MAG-ZINC PO), Take 1 tablet by mouth daily., Disp: , Rfl:  .  carboxymethylcellulose (REFRESH) 1 % ophthalmic solution, Place 1-2 drops into both eyes 2 (two) times daily., Disp: , Rfl:  .  hydrochlorothiazide (HYDRODIURIL) 25 MG tablet, TAKE 1 TABLET(25 MG) BY MOUTH DAILY, Disp: 90 tablet, Rfl: 3 .  losartan (COZAAR) 100 MG tablet, TAKE 1 TABLET(100 MG) BY MOUTH DAILY, Disp: 90 tablet, Rfl: 0 .  Multiple Vitamin (MULTIVITAMIN WITH MINERALS) TABS tablet, Take 1 tablet by mouth daily., Disp: , Rfl:  .  sildenafil (REVATIO) 20 MG tablet, 1 to 4 tablets daily as needed, Disp: 20 tablet, Rfl: 11 .  Vitamin D, Ergocalciferol, (DRISDOL) 1.25 MG (50000 UT) CAPS capsule, Take 1 capsule (50,000 Units total) by mouth every 7 (seven) days., Disp: 12 capsule, Rfl: 3 .  acetaminophen (TYLENOL) 500 MG tablet, Take 1,000 mg by mouth every 6 (six) hours as needed (FOR PAIN.)., Disp: , Rfl:  .  aspirin EC 81 MG tablet, Take 81 mg by mouth daily., Disp: , Rfl:   Review of Systems  Constitutional: Negative for appetite change, chills and fever.  HENT: Negative.   Eyes: Negative.   Respiratory: Negative for chest tightness, shortness of breath and wheezing.   Cardiovascular: Negative for chest pain and palpitations.  Gastrointestinal: Negative for  abdominal pain, nausea and vomiting.  Endocrine: Negative.   Genitourinary:       ED with wife.  Allergic/Immunologic: Negative.   Neurological: Negative.  Negative for weakness.  Psychiatric/Behavioral: Negative.     Social History   Tobacco Use  . Smoking status: Never Smoker  . Smokeless tobacco: Never Used  Substance Use Topics  . Alcohol use: No      Objective:   BP (!) 157/81 (BP Location: Right Arm, Patient Position: Sitting, Cuff Size: Large)   Pulse 74   Temp (!) 97.4 F (36.3 C) (Oral)   Resp 18   Ht 5\' 10"  (1.778 m)   Wt 174 lb (78.9 kg)   SpO2 95%   BMI 24.97 kg/m  Vitals:   05/12/19 1141  BP: (!) 157/81  Pulse: 74  Resp: 18  Temp: (!) 97.4 F (36.3 C)  TempSrc: Oral  SpO2: 95%  Weight: 174 lb (78.9 kg)  Height: 5\' 10"  (1.778 m)  Body mass index is 24.97 kg/m.   Physical Exam Vitals reviewed.  Constitutional:      Appearance: He is well-developed.  HENT:     Head: Normocephalic and atraumatic.  Eyes:     General: No scleral icterus.    Conjunctiva/sclera: Conjunctivae normal.  Neck:     Thyroid: No thyromegaly.  Cardiovascular:     Rate and Rhythm: Normal rate and regular rhythm.     Heart sounds: Normal heart  sounds.  Pulmonary:     Effort: Pulmonary effort is normal.     Breath sounds: Normal breath sounds.  Abdominal:     Palpations: Abdomen is soft.  Musculoskeletal:     Cervical back: Normal range of motion and neck supple.  Lymphadenopathy:     Cervical: No cervical adenopathy.  Skin:    General: Skin is warm and dry.  Neurological:     General: No focal deficit present.     Mental Status: He is alert and oriented to person, place, and time.  Psychiatric:        Mood and Affect: Mood normal.        Behavior: Behavior normal.        Thought Content: Thought content normal.        Judgment: Judgment normal.      No results found for any visits on 05/12/19.     Assessment & Plan    1. Essential (primary)  hypertension Add amlodipine 5 mg daily return to clinic 2 months. - amLODipine (NORVASC) 5 MG tablet; Take 1 tablet (5 mg total) by mouth daily.  Dispense: 30 tablet; Refill: 3  2. ED (erectile dysfunction) of organic origin Refer to urology.  3. Chronic low back pain without sciatica, unspecified back pain laterality Clinically stable.     Richard Cranford Mon, MD  [facility]

## 2019-05-31 ENCOUNTER — Other Ambulatory Visit: Payer: Self-pay | Admitting: Family Medicine

## 2019-05-31 NOTE — Telephone Encounter (Signed)
Requested medication (s) are due for refill today: yes  Requested medication (s) are on the active medication list: yes  Last refill:  03/11/2019  Future visit scheduled:yes  Notes to clinic:  this refill cannot be delegated    Requested Prescriptions  Pending Prescriptions Disp Refills   Vitamin D, Ergocalciferol, (DRISDOL) 1.25 MG (50000 UT) CAPS capsule [Pharmacy Med Name: VITAMIN D2 50,000IU (ERGO) CAP RX] 12 capsule 3    Sig: TAKE 1 CAPSULE BY MOUTH EVERY 7 DAYS      Endocrinology:  Vitamins - Vitamin D Supplementation Failed - 05/31/2019  3:34 AM      Failed - 50,000 IU strengths are not delegated      Failed - Phosphate in normal range and within 360 days    No results found for: PHOS        Failed - Vitamin D in normal range and within 360 days    Vit D, 25-Hydroxy  Date Value Ref Range Status  02/19/2018 54.0 30.0 - 100.0 ng/mL Final    Comment:    Vitamin D deficiency has been defined by the Institute of Medicine and an Endocrine Society practice guideline as a level of serum 25-OH vitamin D less than 20 ng/mL (1,2). The Endocrine Society went on to further define vitamin D insufficiency as a level between 21 and 29 ng/mL (2). 1. IOM (Institute of Medicine). 2010. Dietary reference    intakes for calcium and D. [place]: The    [employer]. 2. Holick MF, Binkley , Bischoff-Ferrari HA, et al.    Evaluation, treatment, and prevention of vitamin D    deficiency: an Endocrine Society clinical practice    guideline. JCEM. 2011 Jul; 96(7):1911-30.           Passed - Ca in normal range and within 360 days    Calcium  Date Value Ref Range Status  03/11/2019 9.4 8.6 - 10.2 mg/dL Final          Passed - Valid encounter within last 12 months    Recent Outpatient Visits           2 weeks ago Essential (primary) hypertension   [facility] Jerrol Banana., MD   2 months ago Annual physical exam   [facility]  Jerrol Banana., MD   1 year ago Annual physical exam   [facility], [place]   1 year ago Fever, unspecified fever cause   [facility] Jerrol Banana., MD   1 year ago Cough   [facility] Jerrol Banana., MD       Future Appointments             In 1 month Jerrol Banana., MD [facility], [place]   In 3 months Jerrol Banana., MD [facility], [place]

## 2019-07-10 ENCOUNTER — Other Ambulatory Visit: Payer: Self-pay | Admitting: Family Medicine

## 2019-07-10 DIAGNOSIS — I10 Essential (primary) hypertension: Secondary | ICD-10-CM

## 2019-07-13 NOTE — Progress Notes (Deleted)
       Patient: Robert Patton Male    DOB: 02/20/1951   69 y.o.   MRN: [id number] Visit Date: 07/13/2019  Today's Provider: [name], MD   No chief complaint on file.  Subjective:     HPI   Essential (primary) hypertension From 05/12/2019-Added amlodipine 5 mg daily return to clinic 2 months.  ED (erectile dysfunction) of organic origin From 05/12/2019-Referred to urology.  Chronic low back pain without sciatica, unspecified back pain laterality From 05/12/2019-Clinically stable.  Allergies  Allergen Reactions  . Amlodipine Besylate     Insomnia  . Diltiazem     Had issues sexually     Current Outpatient Medications:  .  acetaminophen (TYLENOL) 500 MG tablet, Take 1,000 mg by mouth every 6 (six) hours as needed (FOR PAIN.)., Disp: , Rfl:  .  amLODipine (NORVASC) 5 MG tablet, Take 1 tablet (5 mg total) by mouth daily., Disp: 30 tablet, Rfl: 3 .  aspirin EC 81 MG tablet, Take 81 mg by mouth daily., Disp: , Rfl:  .  Calcium-Magnesium-Zinc (CAL-MAG-ZINC PO), Take 1 tablet by mouth daily., Disp: , Rfl:  .  carboxymethylcellulose (REFRESH) 1 % ophthalmic solution, Place 1-2 drops into both eyes 2 (two) times daily., Disp: , Rfl:  .  hydrochlorothiazide (HYDRODIURIL) 25 MG tablet, TAKE 1 TABLET(25 MG) BY MOUTH DAILY, Disp: 90 tablet, Rfl: 3 .  losartan (COZAAR) 100 MG tablet, TAKE 1 TABLET(100 MG) BY MOUTH DAILY, Disp: 90 tablet, Rfl: 0 .  Multiple Vitamin (MULTIVITAMIN WITH MINERALS) TABS tablet, Take 1 tablet by mouth daily., Disp: , Rfl:  .  naproxen (NAPROSYN) 500 MG tablet, Take 500 mg by mouth 2 (two) times daily., Disp: , Rfl:  .  sildenafil (REVATIO) 20 MG tablet, 1 to 4 tablets daily as needed, Disp: 20 tablet, Rfl: 11 .  sildenafil (VIAGRA) 100 MG tablet, See admin instructions., Disp: , Rfl:  .  tadalafil (CIALIS) 5 MG tablet, Take 5 mg by mouth daily., Disp: , Rfl:  .  Vitamin D, Ergocalciferol, (DRISDOL) 1.25 MG (50000 UT) CAPS capsule, TAKE 1 CAPSULE BY  MOUTH EVERY 7 DAYS, Disp: 12 capsule, Rfl: 3  Review of Systems  Constitutional: Negative for appetite change, chills and fever.  Respiratory: Negative for chest tightness, shortness of breath and wheezing.   Cardiovascular: Negative for chest pain and palpitations.  Gastrointestinal: Negative for abdominal pain, nausea and vomiting.    Social History   Tobacco Use  . Smoking status: Never Smoker  . Smokeless tobacco: Never Used  Substance Use Topics  . Alcohol use: No      Objective:   There were no vitals taken for this visit. There were no vitals filed for this visit.There is no height or weight on file to calculate BMI.   Physical Exam   No results found for any visits on 07/14/19.     Assessment & Plan        [name], MD  [facility]

## 2019-07-14 ENCOUNTER — Ambulatory Visit: Payer: Self-pay | Admitting: Family Medicine

## 2019-08-13 DIAGNOSIS — N401 Enlarged prostate with lower urinary tract symptoms: Secondary | ICD-10-CM | POA: Diagnosis not present

## 2019-08-13 DIAGNOSIS — N5201 Erectile dysfunction due to arterial insufficiency: Secondary | ICD-10-CM | POA: Diagnosis not present

## 2019-08-13 DIAGNOSIS — R972 Elevated prostate specific antigen [PSA]: Secondary | ICD-10-CM | POA: Diagnosis not present

## 2019-09-06 ENCOUNTER — Other Ambulatory Visit: Payer: Self-pay

## 2019-09-06 ENCOUNTER — Encounter: Payer: Self-pay | Admitting: Family Medicine

## 2019-09-06 ENCOUNTER — Ambulatory Visit (INDEPENDENT_AMBULATORY_CARE_PROVIDER_SITE_OTHER): Payer: Commercial Managed Care - PPO | Admitting: Family Medicine

## 2019-09-06 VITALS — BP 146/75 | HR 73 | Temp 97.1°F | Wt 177.8 lb

## 2019-09-06 DIAGNOSIS — M26621 Arthralgia of right temporomandibular joint: Secondary | ICD-10-CM

## 2019-09-06 MED ORDER — CYCLOBENZAPRINE HCL 5 MG PO TABS: 5.0000 mg | ORAL_TABLET | Freq: Every evening | ORAL | 0 refills | Status: DC

## 2019-09-06 NOTE — Patient Instructions (Signed)
. Please review the attached list of medications and notify my office if there are any errors.   . Please bring all of your medications to every appointment so we can make sure that our medication list is the same as yours.    Start taking naprosyn 500mg  twice a day for the next 5-7 days and cyclobenzaprine in the evening   Apply ice pack to jaw for about 8 minutes twice a day until   Temporomandibular Joint Syndrome (TMJ)  Temporomandibular joint syndrome (TMJ syndrome) is a condition that causes pain in the temporomandibular joints. These joints are located near your ears and allow your jaw to open and close. For people with TMJ syndrome, chewing, biting, or other movements of the jaw can be difficult or painful. TMJ syndrome is often mild and goes away within a few weeks. However, sometimes the condition becomes a long-term (chronic) problem. What are the causes? This condition may be caused by:  Grinding your teeth or clenching your jaw. Some people do this when they are under stress.  Arthritis.  Injury to the jaw.  Head or neck injury.  Teeth or dentures that are not aligned well. In some cases, the cause of TMJ syndrome may not be known. What are the signs or symptoms? The most common symptom of this condition is an aching pain on the side of the head in the area of the TMJ. Other symptoms may include:  Pain when moving your jaw, such as when chewing or biting.  Being unable to open your jaw all the way.  Making a clicking sound when you open your mouth.  Headache.  Earache.  Neck or shoulder pain. How is this diagnosed? This condition may be diagnosed based on:  Your symptoms and medical history.  A physical exam. Your health care provider may check the range of motion of your jaw.  Imaging tests, such as X-rays or an MRI. You may also need to see your dentist, who will determine if your teeth and jaw are lined up correctly. How is this treated? TMJ  syndrome often goes away on its own. If treatment is needed, the options may include:  Eating soft foods and applying ice or heat.  Medicines to relieve pain or inflammation.  Medicines or massage to relax the muscles.  A splint, bite plate, or mouthpiece to prevent teeth grinding or jaw clenching.  Relaxation techniques or counseling to help reduce stress.  A therapy for pain in which an electrical current is applied to the nerves through the skin (transcutaneous electrical nerve stimulation).  Acupuncture. This is sometimes helpful to relieve pain.  Jaw surgery. This is rarely needed. Follow these instructions at home:  Eating and drinking  Eat a soft diet if you are having trouble chewing.  Avoid foods that require a lot of chewing. Do not chew gum. General instructions  Take over-the-counter and prescription medicines only as told by your health care provider.  If directed, put ice on the painful area. ? Put ice in a plastic bag. ? Place a towel between your skin and the bag. ? Leave the ice on for 20 minutes, 2-3 times a day.  Apply a warm, wet cloth (warm compress) to the painful area as directed.  Massage your jaw area and do any jaw stretching exercises as told by your health care provider.  If you were given a splint, bite plate, or mouthpiece, wear it as told by your health care provider.  Keep all follow-up  visits as told by your health care provider. This is important. Contact a health care provider if:  You are having trouble eating.  You have new or worsening symptoms. Get help right away if:  Your jaw locks open or closed. Summary  Temporomandibular joint syndrome (TMJ syndrome) is a condition that causes pain in the temporomandibular joints. These joints are located near your ears and allow your jaw to open and close.  TMJ syndrome is often mild and goes away within a few weeks. However, sometimes the condition becomes a long-term (chronic)  problem.  Symptoms include an aching pain on the side of the head in the area of the TMJ, pain when chewing or biting, and being unable to open your jaw all the way. You may also make a clicking sound when you open your mouth.  TMJ syndrome often goes away on its own. If treatment is needed, it may include medicines to relieve pain, reduce inflammation, or relax the muscles. A splint, bite plate, or mouthpiece may also be used to prevent teeth grinding or jaw clenching. This information is not intended to replace advice given to you by your health care provider. Make sure you discuss any questions you have with your health care provider. Document Revised: 07/25/2017 Document Reviewed: 06/24/2017 Elsevier Patient Education  2020 [employer].

## 2019-09-06 NOTE — Progress Notes (Signed)
    Established patient visit      Patient: Robert Patton   DOB: May 17, 1951   69 y.o. Male  MRN: [id number] Visit Date: 09/06/2019  Today's healthcare provider: Lelon Huh, MD  Subjective:    Chief Complaint  Patient presents with  . Jaw Pain   HPI Patient presents today C/O right sided jaw pain and ear pain. He states the pain started 2 days ago. He described the pain as aching . The pain does radiate down the right side of neck area. He has tried Tylernol for the symptoms with mild relief.      Medications: Outpatient Medications Prior to Visit  Medication Sig  . acetaminophen (TYLENOL) 500 MG tablet Take 1,000 mg by mouth every 6 (six) hours as needed (FOR PAIN.).  Marland Kitchen amLODipine (NORVASC) 5 MG tablet Take 1 tablet (5 mg total) by mouth daily.  Marland Kitchen aspirin EC 81 MG tablet Take 81 mg by mouth daily.  . Calcium-Magnesium-Zinc (CAL-MAG-ZINC PO) Take 1 tablet by mouth daily.  . carboxymethylcellulose (REFRESH) 1 % ophthalmic solution Place 1-2 drops into both eyes 2 (two) times daily.  . hydrochlorothiazide (HYDRODIURIL) 25 MG tablet TAKE 1 TABLET(25 MG) BY MOUTH DAILY  . losartan (COZAAR) 100 MG tablet TAKE 1 TABLET(100 MG) BY MOUTH DAILY  . Multiple Vitamin (MULTIVITAMIN WITH MINERALS) TABS tablet Take 1 tablet by mouth daily.  . naproxen (NAPROSYN) 500 MG tablet Take 500 mg by mouth 2 (two) times daily.  . tadalafil (CIALIS) 5 MG tablet Take 5 mg by mouth daily.  . Vitamin D, Ergocalciferol, (DRISDOL) 1.25 MG (50000 UT) CAPS capsule TAKE 1 CAPSULE BY MOUTH EVERY 7 DAYS  . [DISCONTINUED] sildenafil (REVATIO) 20 MG tablet 1 to 4 tablets daily as needed  . [DISCONTINUED] sildenafil (VIAGRA) 100 MG tablet See admin instructions.   No facility-administered medications prior to visit.    Review of Systems  Constitutional: Negative.   HENT: Positive for ear pain.        Jaw pain   Respiratory: Negative.   Cardiovascular: Negative.   Musculoskeletal: Negative.           Objective:    BP (!) 146/75 (BP Location: Right Arm, Patient Position: Sitting, Cuff Size: Normal)   Pulse 73   Temp (!) 97.1 F (36.2 C) (Temporal)   Wt 177 lb 12.8 oz (80.6 kg)   BMI 25.51 kg/m    Physical Exam  Tender right TMD, no swelling. FROM of jaw. Normal oral cavity and OP exam, no lesions.   No results found for any visits on 09/06/19.    Assessment & Plan:    1. Arthralgia of right temporomandibular joint He has prescription for naproxen at home which he started taking yesterday - cyclobenzaprine (FLEXERIL) 5 MG tablet; Take 1-2 tablets (5-10 mg total) by mouth every evening.  Dispense: 14 tablet; Refill: 0  Given printed information regarding TMJ     Lelon Huh, MD  [facility] [phone] (phone) [phone] (fax)  [place]

## 2019-09-09 ENCOUNTER — Other Ambulatory Visit: Payer: Self-pay | Admitting: Family Medicine

## 2019-09-09 ENCOUNTER — Ambulatory Visit: Payer: Self-pay | Admitting: Family Medicine

## 2019-09-09 DIAGNOSIS — I10 Essential (primary) hypertension: Secondary | ICD-10-CM

## 2019-09-14 NOTE — Progress Notes (Signed)
Established patient visit  I,Robert Patton,acting as a scribe for Robert Durie, MD.,have documented all relevant documentation on the behalf of Robert Durie, MD,as directed by  Robert Durie, MD while in the presence of Robert Durie, MD.    Patient: Robert Patton   DOB: Oct 03, 1950   69 y.o. Male  MRN: [id number] Visit Date: 09/15/2019  Today's healthcare provider: [name], MD   Chief Complaint  Patient presents with  . Follow-up  . Hypertension   Subjective    HPI  He was seen last week by Dr. Caryn Section for TMJ and he is still having some symptoms but they are improving.  No headache, no tenderness over the temple area. Hypertension, follow-up  BP Readings from Last 3 Encounters:  09/15/19 138/77  09/06/19 (!) 146/75  05/12/19 (!) 157/81   Wt Readings from Last 3 Encounters:  09/15/19 179 lb (81.2 kg)  09/06/19 177 lb 12.8 oz (80.6 kg)  05/12/19 174 lb (78.9 kg)     He was last seen for hypertension 4 months ago.  BP at that visit was 157/81. Management since that visit includes; Added amlodipine 5 mg daily return to clinic 2 months.   He reports good compliance with treatment. He is not having side effects. none He is following a Regular diet. He does not smoke.  Use of agents associated with hypertension: none.   Outside blood pressures are normal.  Symptoms:  YES NO    []    [x]    Chest Pain   []    [x]    Chest pressure/discomfort   []    [x]    Palpitations   []    [x]    Dyspnea   []    [x]    Orthopnea   []    [x]    Paroxysmal nocturnal dyspnea   []   [x]    Lower extremity edema   []    [x]   Syncope   Pertinent labs: Lab Results  Component Value Date   CHOL 168 03/11/2019   HDL 69 03/11/2019   LDLCALC 87 03/11/2019   TRIG 64 03/11/2019   CHOLHDL 2.4 03/11/2019   Lab Results  Component Value Date   NA 139 03/11/2019   K 3.8 03/11/2019   CO2 25 03/11/2019   GLUCOSE 90 03/11/2019   BUN 15 03/11/2019   CREATININE 1.11  03/11/2019   CALCIUM 9.4 03/11/2019   GFRNONAA 67 03/11/2019   GFRAA 78 03/11/2019     The 10-year ASCVD risk score Mikey Bussing DC Jr., et al., 2013) is: 18.9%   --------------------------------------------------------------------       Medications: Outpatient Medications Prior to Visit  Medication Sig  . acetaminophen (TYLENOL) 500 MG tablet Take 1,000 mg by mouth every 6 (six) hours as needed (FOR PAIN.).  Marland Kitchen amLODipine (NORVASC) 5 MG tablet TAKE 1 TABLET(5 MG) BY MOUTH DAILY  . Calcium-Magnesium-Zinc (CAL-MAG-ZINC PO) Take 1 tablet by mouth daily.  . carboxymethylcellulose (REFRESH) 1 % ophthalmic solution Place 1-2 drops into both eyes 2 (two) times daily.  . cyclobenzaprine (FLEXERIL) 5 MG tablet Take 1-2 tablets (5-10 mg total) by mouth every evening.  . hydrochlorothiazide (HYDRODIURIL) 25 MG tablet TAKE 1 TABLET(25 MG) BY MOUTH DAILY  . losartan (COZAAR) 100 MG tablet TAKE 1 TABLET(100 MG) BY MOUTH DAILY  . Multiple Vitamin (MULTIVITAMIN WITH MINERALS) TABS tablet Take 1 tablet by mouth daily.  . tadalafil (CIALIS) 5 MG tablet Take 5 mg by mouth daily.  . Vitamin D, Ergocalciferol, (DRISDOL) 1.25 MG (50000 UT)  CAPS capsule TAKE 1 CAPSULE BY MOUTH EVERY 7 DAYS  . [DISCONTINUED] naproxen (NAPROSYN) 500 MG tablet Take 500 mg by mouth 2 (two) times daily.  Marland Kitchen aspirin EC 81 MG tablet Take 81 mg by mouth daily.   No facility-administered medications prior to visit.    Review of Systems  Constitutional: Negative for appetite change, chills and fever.  Eyes: Negative.   Respiratory: Negative for chest tightness, shortness of breath and wheezing.   Cardiovascular: Negative for chest pain and palpitations.  Gastrointestinal: Negative for abdominal pain, nausea and vomiting.  Endocrine: Negative.   Genitourinary: Negative.   Musculoskeletal: Negative.   Allergic/Immunologic: Negative.   Neurological: Negative.   Hematological: Negative.   Psychiatric/Behavioral: Negative.          Objective    BP 138/77 (BP Location: Right Arm, Patient Position: Sitting, Cuff Size: Large)   Pulse 76   Temp (!) 97.1 F (36.2 C) (Other (Comment))   Resp 16   Ht 5\' 10"  (1.778 m)   Wt 179 lb (81.2 kg)   SpO2 97%   BMI 25.68 kg/m  BP Readings from Last 3 Encounters:  09/15/19 138/77  09/06/19 (!) 146/75  05/12/19 (!) 157/81   Wt Readings from Last 3 Encounters:  09/15/19 179 lb (81.2 kg)  09/06/19 177 lb 12.8 oz (80.6 kg)  05/12/19 174 lb (78.9 kg)      Physical Exam Vitals and nursing note reviewed. Exam conducted with a chaperone present.  Constitutional:      Appearance: Normal appearance.  Cardiovascular:     Rate and Rhythm: Normal rate and regular rhythm.  Pulmonary:     Effort: Pulmonary effort is normal.     Breath sounds: Normal breath sounds.  Abdominal:     Palpations: Abdomen is soft.  Musculoskeletal:        General: Normal range of motion.  Neurological:     Mental Status: He is alert.  Psychiatric:        Mood and Affect: Mood normal.        Behavior: Behavior normal.       No results found for any visits on 09/15/19.   Assessment & Plan    1. Essential (primary) hypertension Controlled.  2. Screening for colon cancer Last screening colonoscopy March 2011 - Ambulatory referral to Gastroenterology  3. Dislocation of temporomandibular joint, subsequent encounter  - naproxen (NAPROSYN) 500 MG tablet; Take 1 tablet (500 mg total) by mouth 2 (two) times daily.  Dispense: 60 tablet; Refill: 1    Follow up in 6 months for CPE.   Return in about 6 months (around 03/16/2020).      I, Robert Durie, MD, have reviewed all documentation for this visit. The documentation on 09/16/19 for the exam, diagnosis, procedures, and orders are all accurate and complete.    Dimitra Woodstock Cranford Mon, MD  [facility] [phone] (phone) [phone] (fax)  [place]

## 2019-09-15 ENCOUNTER — Other Ambulatory Visit: Payer: Self-pay

## 2019-09-15 ENCOUNTER — Ambulatory Visit (INDEPENDENT_AMBULATORY_CARE_PROVIDER_SITE_OTHER): Payer: Commercial Managed Care - PPO | Admitting: Family Medicine

## 2019-09-15 ENCOUNTER — Encounter: Payer: Self-pay | Admitting: Family Medicine

## 2019-09-15 VITALS — BP 138/77 | HR 76 | Temp 97.1°F | Resp 16 | Ht 70.0 in | Wt 179.0 lb

## 2019-09-15 DIAGNOSIS — S0340XS Sprain of jaw, unspecified side, sequela: Secondary | ICD-10-CM

## 2019-09-15 DIAGNOSIS — S0300XD Dislocation of jaw, unspecified side, subsequent encounter: Secondary | ICD-10-CM | POA: Diagnosis not present

## 2019-09-15 DIAGNOSIS — I10 Essential (primary) hypertension: Secondary | ICD-10-CM

## 2019-09-15 DIAGNOSIS — Z1211 Encounter for screening for malignant neoplasm of colon: Secondary | ICD-10-CM

## 2019-09-15 MED ORDER — NAPROXEN 500 MG PO TABS: 500.0000 mg | ORAL_TABLET | Freq: Two times a day (BID) | ORAL | 1 refills | Status: DC

## 2019-09-22 ENCOUNTER — Telehealth (INDEPENDENT_AMBULATORY_CARE_PROVIDER_SITE_OTHER): Payer: Self-pay | Admitting: Gastroenterology

## 2019-09-22 ENCOUNTER — Other Ambulatory Visit: Payer: Self-pay

## 2019-09-22 DIAGNOSIS — Z1211 Encounter for screening for malignant neoplasm of colon: Secondary | ICD-10-CM

## 2019-09-22 NOTE — Progress Notes (Signed)
Gastroenterology Pre-Procedure Review  Request Date: Friday 10/08/19 Requesting Physician: Dr. Marius Ditch  PATIENT REVIEW QUESTIONS: The patient responded to the following health history questions as indicated:    1. Are you having any GI issues? no 2. Do you have a personal history of Polyps? no 3. Do you have a family history of Colon Cancer or Polyps? no 4. Diabetes Mellitus? no 5. Joint replacements in the past 12 months?no 6. Major health problems in the past 3 months?no 7. Any artificial heart valves, MVP, or defibrillator?no    MEDICATIONS & ALLERGIES:    Patient reports the following regarding taking any anticoagulation/antiplatelet therapy:   Plavix, Coumadin, Eliquis, Xarelto, Lovenox, Pradaxa, Brilinta, or Effient? no Aspirin? no  Patient confirms/reports the following medications:  Current Outpatient Medications  Medication Sig Dispense Refill  . acetaminophen (TYLENOL) 500 MG tablet Take 1,000 mg by mouth every 6 (six) hours as needed (FOR PAIN.).    Marland Kitchen amLODipine (NORVASC) 5 MG tablet TAKE 1 TABLET(5 MG) BY MOUTH DAILY 90 tablet 0  . aspirin EC 81 MG tablet Take 81 mg by mouth daily.    . Calcium-Magnesium-Zinc (CAL-MAG-ZINC PO) Take 1 tablet by mouth daily.    . carboxymethylcellulose (REFRESH) 1 % ophthalmic solution Place 1-2 drops into both eyes 2 (two) times daily.    . cyclobenzaprine (FLEXERIL) 5 MG tablet Take 1-2 tablets (5-10 mg total) by mouth every evening. 14 tablet 0  . hydrochlorothiazide (HYDRODIURIL) 25 MG tablet TAKE 1 TABLET(25 MG) BY MOUTH DAILY 90 tablet 3  . losartan (COZAAR) 100 MG tablet TAKE 1 TABLET(100 MG) BY MOUTH DAILY 90 tablet 0  . Multiple Vitamin (MULTIVITAMIN WITH MINERALS) TABS tablet Take 1 tablet by mouth daily.    . naproxen (NAPROSYN) 500 MG tablet Take 1 tablet (500 mg total) by mouth 2 (two) times daily. 60 tablet 1  . tadalafil (CIALIS) 5 MG tablet Take 5 mg by mouth daily.    . Vitamin D, Ergocalciferol, (DRISDOL) 1.25 MG (50000 UT)  CAPS capsule TAKE 1 CAPSULE BY MOUTH EVERY 7 DAYS 12 capsule 3   No current facility-administered medications for this visit.    Patient confirms/reports the following allergies:  Allergies  Allergen Reactions  . Amlodipine Besylate     Insomnia  . Diltiazem     Had issues sexually    No orders of the defined types were placed in this encounter.   AUTHORIZATION INFORMATION Primary Insurance: 1D#: Group #:  Secondary Insurance: 1D#: Group #:  SCHEDULE INFORMATION: Date: 10/08/19 Time: Location:[facility]

## 2019-10-06 ENCOUNTER — Other Ambulatory Visit
Admission: RE | Admit: 2019-10-06 | Discharge: 2019-10-06 | Disposition: A | Discharge status: Home / Self Care | Payer: Commercial Managed Care - PPO | Source: Ambulatory Visit | Attending: Gastroenterology | Admitting: Gastroenterology

## 2019-10-06 ENCOUNTER — Other Ambulatory Visit: Payer: Self-pay

## 2019-10-06 DIAGNOSIS — Z20822 Contact with and (suspected) exposure to covid-19: Secondary | ICD-10-CM | POA: Insufficient documentation

## 2019-10-06 DIAGNOSIS — Z01812 Encounter for preprocedural laboratory examination: Secondary | ICD-10-CM | POA: Insufficient documentation

## 2019-10-06 LAB — SARS CORONAVIRUS 2 (TAT 6-24 HRS): SARS Coronavirus 2: NEGATIVE

## 2019-10-07 ENCOUNTER — Encounter: Payer: Self-pay | Admitting: Gastroenterology

## 2019-10-08 ENCOUNTER — Ambulatory Visit
Admission: RE | Admit: 2019-10-08 | Discharge: 2019-10-08 | Disposition: A | Discharge status: Home / Self Care | Payer: Commercial Managed Care - PPO | Attending: Gastroenterology | Admitting: Gastroenterology

## 2019-10-08 ENCOUNTER — Other Ambulatory Visit: Payer: Self-pay

## 2019-10-08 ENCOUNTER — Encounter
Admission: RE | Disposition: A | Discharge status: Home / Self Care | Payer: Self-pay | Source: Home / Self Care | Attending: Gastroenterology

## 2019-10-08 ENCOUNTER — Ambulatory Visit: Payer: Commercial Managed Care - PPO | Admitting: Anesthesiology

## 2019-10-08 ENCOUNTER — Encounter: Payer: Self-pay | Admitting: Gastroenterology

## 2019-10-08 DIAGNOSIS — Z79899 Other long term (current) drug therapy: Secondary | ICD-10-CM | POA: Insufficient documentation

## 2019-10-08 DIAGNOSIS — I10 Essential (primary) hypertension: Secondary | ICD-10-CM | POA: Diagnosis not present

## 2019-10-08 DIAGNOSIS — Z7982 Long term (current) use of aspirin: Secondary | ICD-10-CM | POA: Insufficient documentation

## 2019-10-08 DIAGNOSIS — Z1211 Encounter for screening for malignant neoplasm of colon: Secondary | ICD-10-CM

## 2019-10-08 DIAGNOSIS — E559 Vitamin D deficiency, unspecified: Secondary | ICD-10-CM | POA: Diagnosis not present

## 2019-10-08 DIAGNOSIS — N529 Male erectile dysfunction, unspecified: Secondary | ICD-10-CM | POA: Insufficient documentation

## 2019-10-08 DIAGNOSIS — E785 Hyperlipidemia, unspecified: Secondary | ICD-10-CM | POA: Insufficient documentation

## 2019-10-08 HISTORY — PX: COLONOSCOPY WITH PROPOFOL: SHX5780

## 2019-10-08 HISTORY — DX: Male erectile dysfunction, unspecified: N52.9

## 2019-10-08 SURGERY — COLONOSCOPY WITH PROPOFOL
Anesthesia: General

## 2019-10-08 MED ORDER — PROPOFOL 500 MG/50ML IV EMUL
INTRAVENOUS | Status: DC | PRN
  Administered 2019-10-08: 120 ug/kg/min via INTRAVENOUS

## 2019-10-08 MED ORDER — SODIUM CHLORIDE 0.9 % IV SOLN
INTRAVENOUS | Status: DC
  Administered 2019-10-08: 1000 mL via INTRAVENOUS

## 2019-10-08 MED ORDER — PROPOFOL 500 MG/50ML IV EMUL
INTRAVENOUS | Status: AC
  Filled 2019-10-08: qty 50

## 2019-10-08 NOTE — Transfer of Care (Signed)
Immediate Anesthesia Transfer of Care Note  Patient: Robert Patton  Procedure(s) Performed: COLONOSCOPY WITH PROPOFOL (N/A )  Patient Location: PACU  Anesthesia Type:General  Level of Consciousness: awake and sedated  Airway & Oxygen Therapy: Patient Spontanous Breathing and Patient connected to nasal cannula oxygen  Post-op Assessment: Report given to RN and Post -op Vital signs reviewed and stable  Post vital signs: Reviewed and stable  Last Vitals:  Vitals Value Taken Time  BP    Temp    Pulse    Resp    SpO2      Last Pain:  Vitals:   10/08/19 0828  TempSrc: Temporal  PainSc: 0-No pain         Complications: No apparent anesthesia complications

## 2019-10-08 NOTE — H&P (Signed)
Robert Darby, MD [address]  [place]  [place], [place] [address]  Main: [phone]  Fax: [phone] Pager: [phone]  Primary Care Physician:  Jerrol Banana., MD Primary Gastroenterologist:  Dr. Cephas Patton  Pre-Procedure History & Physical: HPI:  DRAEGAN Patton is a 69 y.o. male is here for an colonoscopy.   Past Medical History:  Diagnosis Date  . ED (erectile dysfunction)   . Hyperlipidemia   . Hypertension     Past Surgical History:  Procedure Laterality Date  . COLONOSCOPY  08/15/2009  . EYE SURGERY Right 2016   CATARACT EXTRACTION  . INGUINAL HERNIA REPAIR Right 07/22/2017   Procedure: HERNIA REPAIR INGUINAL ADULT;  Surgeon: Royston Cowper, MD;  Location: [facility] ORS;  Service: Urology;  Laterality: Right;  . NO PAST SURGERIES      Prior to Admission medications   Medication Sig Start Date End Date Taking? Authorizing Provider  acetaminophen (TYLENOL) 500 MG tablet Take 1,000 mg by mouth every 6 (six) hours as needed (FOR PAIN.).   Yes [provider]  amLODipine (NORVASC) 5 MG tablet TAKE 1 TABLET(5 MG) BY MOUTH DAILY 09/09/19  Yes Jerrol Banana., MD  aspirin EC 81 MG tablet Take 81 mg by mouth daily.   Yes [provider]  Calcium-Magnesium-Zinc (CAL-MAG-ZINC PO) Take 1 tablet by mouth daily.   Yes [provider]  carboxymethylcellulose (REFRESH) 1 % ophthalmic solution Place 1-2 drops into both eyes 2 (two) times daily.   Yes [provider]  cyclobenzaprine (FLEXERIL) 5 MG tablet Take 1-2 tablets (5-10 mg total) by mouth every evening. 09/06/19  Yes Birdie Sons, MD  hydrochlorothiazide (HYDRODIURIL) 25 MG tablet TAKE 1 TABLET(25 MG) BY MOUTH DAILY 03/11/19  Yes Jerrol Banana., MD  losartan (COZAAR) 100 MG tablet TAKE 1 TABLET(100 MG) BY MOUTH DAILY 07/10/19  Yes Jerrol Banana., MD  Multiple Vitamin (MULTIVITAMIN WITH MINERALS) TABS tablet Take 1 tablet by mouth daily.   Yes  [provider]  naproxen (NAPROSYN) 500 MG tablet Take 1 tablet (500 mg total) by mouth 2 (two) times daily. 09/15/19  Yes Jerrol Banana., MD  tadalafil (CIALIS) 5 MG tablet Take 5 mg by mouth daily. 04/02/19  Yes [provider]  Vitamin D, Ergocalciferol, (DRISDOL) 1.25 MG (50000 UT) CAPS capsule TAKE 1 CAPSULE BY MOUTH EVERY 7 DAYS 06/01/19  Yes Jerrol Banana., MD    Allergies as of 09/22/2019 - Review Complete 09/22/2019  Allergen Reaction Noted  . Amlodipine besylate  08/18/2012  . Diltiazem  08/01/2016    Family History  Problem Relation Age of Onset  . Hypertension Brother     Social History   Socioeconomic History  . Marital status: Married    Spouse name: Not on file  . Number of children: Not on file  . Years of education: Not on file  . Highest education level: Not on file  Occupational History  . Not on file  Tobacco Use  . Smoking status: Never Smoker  . Smokeless tobacco: Never Used  Substance and Sexual Activity  . Alcohol use: No  . Drug use: No  . Sexual activity: Not on file  Other Topics Concern  . Not on file  Social History Narrative  . Not on file   Social Determinants of Health   Financial Resource Strain:   . Difficulty of Paying Living Expenses:   Food Insecurity:   . Worried  About Running Out of Food in the Last Year:   . [place] in the Last Year:   Transportation Needs:   . Lack of Transportation (Medical):   Marland Kitchen Lack of Transportation (Non-Medical):   Physical Activity:   . Days of Exercise per Week:   . Minutes of Exercise per Session:   Stress:   . Feeling of Stress :   Social Connections:   . Frequency of Communication with Friends and Family:   . Frequency of Social Gatherings with Friends and Family:   . Attends Religious Services:   . Active Member of Clubs or Organizations:   . Attends Archivist Meetings:   Marland Kitchen Marital Status:   Intimate Partner Violence:   . Fear of Current  or Ex-Partner:   . Emotionally Abused:   Marland Kitchen Physically Abused:   . Sexually Abused:     Review of Systems: See HPI, otherwise negative ROS  Physical Exam: BP (!) 155/85   Pulse 93   Temp (!) 97.5 F (36.4 C) (Temporal)   Resp 18   Ht 5\' 10"  (1.778 m)   Wt 77.1 kg   SpO2 100%   BMI 24.39 kg/m  General:   Alert,  pleasant and cooperative in NAD Head:  Normocephalic and atraumatic. Neck:  Supple; no masses or thyromegaly. Lungs:  Clear throughout to auscultation.    Heart:  Regular rate and rhythm. Abdomen:  Soft, nontender and nondistended. Normal bowel sounds, without guarding, and without rebound.   Neurologic:  Alert and  oriented x4;  grossly normal neurologically.  Impression/Plan: Robert Patton is here for an colonoscopy to be performed for colon cancer screening  Risks, benefits, limitations, and alternatives regarding  colonoscopy have been reviewed with the patient.  Questions have been answered.  All parties agreeable.   Sherri Sear, MD  10/08/2019, 8:57 AM

## 2019-10-08 NOTE — Anesthesia Procedure Notes (Signed)
Performed by: Cook-Martin, Imberly Troxler Pre-anesthesia Checklist: Patient identified, Emergency Drugs available, Suction available, Patient being monitored and Timeout performed Patient Re-evaluated:Patient Re-evaluated prior to induction Oxygen Delivery Method: Nasal cannula Preoxygenation: Pre-oxygenation with 100% oxygen Induction Type: IV induction Placement Confirmation: positive ETCO2 and CO2 detector       

## 2019-10-08 NOTE — Anesthesia Preprocedure Evaluation (Signed)
Anesthesia Evaluation  Patient identified by MRN, date of birth, ID band Patient awake    Reviewed: Allergy & Precautions, H&P , NPO status , Patient's Chart, lab work & pertinent test results  History of Anesthesia Complications Negative for: history of anesthetic complications  Airway Mallampati: III  TM Distance: <3 FB Neck ROM: limited    Dental  (+) Chipped   Pulmonary neg pulmonary ROS, neg shortness of breath,           Cardiovascular Exercise Tolerance: Good hypertension, (-) angina(-) Past MI and (-) DOE      Neuro/Psych PSYCHIATRIC DISORDERS    GI/Hepatic negative GI ROS, Neg liver ROS, neg GERD  ,  Endo/Other  negative endocrine ROS  Renal/GU      Musculoskeletal   Abdominal   Peds  Hematology negative hematology ROS (+)   Anesthesia Other Findings Past Medical History: No date: Hyperlipidemia No date: Hypertension  Past Surgical History: 08/15/2009: COLONOSCOPY 2016: EYE SURGERY; Right     Comment:  CATARACT EXTRACTION No date: NO PAST SURGERIES  BMI    Body Mass Index:  24.11 kg/m      Reproductive/Obstetrics negative OB ROS                             Anesthesia Physical  Anesthesia Plan  ASA: II  Anesthesia Plan: General   Post-op Pain Management:    Induction: Intravenous  PONV Risk Score and Plan: 2 and Propofol infusion and TIVA  Airway Management Planned: Natural Airway and Nasal Cannula  Additional Equipment:   Intra-op Plan:   Post-operative Plan: Extubation in OR  Informed Consent: I have reviewed the patients History and Physical, chart, labs and discussed the procedure including the risks, benefits and alternatives for the proposed anesthesia with the patient or authorized representative who has indicated his/her understanding and acceptance.     Dental Advisory Given  Plan Discussed with: Anesthesiologist, CRNA and  Surgeon  Anesthesia Plan Comments: (Patient consented for risks of anesthesia including but not limited to:  - adverse reactions to medications - damage to teeth, lips or other oral mucosa - sore throat or hoarseness - Damage to heart, brain, lungs or loss of life  Patient voiced understanding.)        Anesthesia Quick Evaluation

## 2019-10-08 NOTE — Anesthesia Postprocedure Evaluation (Signed)
Anesthesia Post Note  Patient: Robert Patton  Procedure(s) Performed: COLONOSCOPY WITH PROPOFOL (N/A )  Patient location during evaluation: Endoscopy Anesthesia Type: General Level of consciousness: awake and alert Pain management: pain level controlled Vital Signs Assessment: post-procedure vital signs reviewed and stable Respiratory status: spontaneous breathing, nonlabored ventilation, respiratory function stable and patient connected to nasal cannula oxygen Cardiovascular status: blood pressure returned to baseline and stable Postop Assessment: no apparent nausea or vomiting Anesthetic complications: no     Last Vitals:  Vitals:   10/08/19 0940 10/08/19 0950  BP: (!) 110/53 (!) 151/78  Pulse: 72 68  Resp: 13 13  Temp:    SpO2: 99% 100%    Last Pain:  Vitals:   10/08/19 0950  TempSrc:   PainSc: 0-No pain                 Martha Clan

## 2019-10-08 NOTE — Op Note (Addendum)
[facility] Gastroenterology Patient Name: Robert Patton Procedure Date: 10/08/2019 9:05 AM MRN: [id number] Account #: [id number] Date of Birth: 1951-01-04 Admit Type: Outpatient Age: 69 Room: [facility] ENDO ROOM 3 Gender: Male Note Status: Finalized Procedure:             Colonoscopy Indications:           Screening for colorectal malignant neoplasm, Last                         colonoscopy: March 2011 Providers:             Lin Landsman MD, MD Referring MD:          Janine Ores. Rosanna Randy, MD (Referring MD) Medicines:             Monitored Anesthesia Care Complications:         No immediate complications. Estimated blood loss: None. Procedure:             Pre-Anesthesia Assessment:                        - Prior to the procedure, a History and Physical was                         performed, and patient medications and allergies were                         reviewed. The patient is competent. The risks and                         benefits of the procedure and the sedation options and                         risks were discussed with the patient. All questions                         were answered and informed consent was obtained.                         Patient identification and proposed procedure were                         verified by the physician, the nurse, the                         anesthesiologist, the anesthetist and the technician                         in the pre-procedure area in the procedure room in the                         endoscopy suite. Mental Status Examination: alert and                         oriented. Airway Examination: normal oropharyngeal                         airway and neck mobility. Respiratory Examination:  clear to auscultation. CV Examination: normal.                         Prophylactic Antibiotics: The patient does not require                         prophylactic antibiotics. Prior Anticoagulants: The                          patient has taken no previous anticoagulant or                         antiplatelet agents. ASA Grade Assessment: II - A                         patient with mild systemic disease. After reviewing                         the risks and benefits, the patient was deemed in                         satisfactory condition to undergo the procedure. The                         anesthesia plan was to use monitored anesthesia care                         (MAC). Immediately prior to administration of                         medications, the patient was re-assessed for adequacy                         to receive sedatives. The heart rate, respiratory                         rate, oxygen saturations, blood pressure, adequacy of                         pulmonary ventilation, and response to care were                         monitored throughout the procedure. The physical                         status of the patient was re-assessed after the                         procedure.                        After obtaining informed consent, the colonoscope was                         passed under direct vision. Throughout the procedure,                         the patient's blood pressure, pulse, and oxygen  saturations were monitored continuously. The                         Colonoscope was introduced through the anus and                         advanced to the the terminal ileum, with                         identification of the appendiceal orifice and IC                         valve. The colonoscopy was performed without                         difficulty. The patient tolerated the procedure well.                         The quality of the bowel preparation was evaluated                         using the BBPS [facility] Bowel Preparation Scale) with                         scores of: Right Colon = 3, Transverse Colon = 3 and                         Left Colon = 3  (entire mucosa seen well with no                         residual staining, small fragments of stool or opaque                         liquid). The total BBPS score equals 9. Findings:      The perianal and digital rectal examinations were normal. Pertinent       negatives include normal sphincter tone and no palpable rectal lesions.      The entire examined colon appeared normal.      The retroflexed view of the distal rectum and anal verge was normal and       showed no anal or rectal abnormalities. Impression:            - The entire examined colon is normal.                        - The distal rectum and anal verge are normal on                         retroflexion view.                        - No specimens collected. Recommendation:        - Discharge patient to home (with escort).                        - Resume previous diet today.                        -  Continue present medications.                        - Repeat colonoscopy in 10 years for surveillance. Procedure Code(s):     --- Professional ---                        P3825, Colorectal cancer screening; colonoscopy on                         individual not meeting criteria for high risk Diagnosis Code(s):     --- Professional ---                        Z12.11, Encounter for screening for malignant neoplasm                         of colon CPT copyright 2019 American Medical Association. All rights reserved. The codes documented in this report are preliminary and upon coder review may  be revised to meet current compliance requirements. Dr. Ulyess Mort Lin Landsman MD, MD 10/08/2019 9:27:21 AM This report has been signed electronically. Number of Addenda: 0 Note Initiated On: 10/08/2019 9:05 AM Scope Withdrawal Time: 0 hours 8 minutes 15 seconds  Total Procedure Duration: 0 hours 11 minutes 52 seconds  Estimated Blood Loss:  Estimated blood loss: none.      [facility]

## 2019-11-04 ENCOUNTER — Other Ambulatory Visit: Payer: Self-pay | Admitting: Family Medicine

## 2019-11-04 DIAGNOSIS — I10 Essential (primary) hypertension: Secondary | ICD-10-CM

## 2019-12-07 ENCOUNTER — Other Ambulatory Visit: Payer: Self-pay | Admitting: Family Medicine

## 2019-12-07 DIAGNOSIS — I10 Essential (primary) hypertension: Secondary | ICD-10-CM

## 2019-12-07 NOTE — Telephone Encounter (Signed)
Requested Prescriptions  Pending Prescriptions Disp Refills  . amLODipine (NORVASC) 5 MG tablet [Pharmacy Med Name: AMLODIPINE BESYLATE 5MG  TABLETS] 90 tablet 0    Sig: TAKE 1 TABLET(5 MG) BY MOUTH DAILY     Cardiovascular:  Calcium Channel Blockers Failed - 12/07/2019  3:36 AM      Failed - Last BP in normal range    BP Readings from Last 1 Encounters:  10/08/19 (!) 151/78         Passed - Valid encounter within last 6 months    Recent Outpatient Visits          2 months ago Essential (primary) hypertension   [facility] Jerrol Banana., MD   3 months ago Arthralgia of right temporomandibular joint   [facility] Birdie Sons, MD   6 months ago Essential (primary) hypertension   [facility] Jerrol Banana., MD   9 months ago Annual physical exam   [facility] Jerrol Banana., MD   1 year ago Annual physical exam   [facility] Fenton Malling [place], [place]

## 2020-01-30 ENCOUNTER — Other Ambulatory Visit: Payer: Self-pay | Admitting: Family Medicine

## 2020-01-30 DIAGNOSIS — I10 Essential (primary) hypertension: Secondary | ICD-10-CM

## 2020-03-06 ENCOUNTER — Ambulatory Visit: Payer: Commercial Managed Care - PPO

## 2020-03-14 ENCOUNTER — Encounter: Payer: Self-pay | Admitting: Family Medicine

## 2020-03-20 NOTE — Progress Notes (Signed)
I,Robert Patton,acting as a scribe for Robert Durie, MD.,have documented all relevant documentation on the behalf of Robert Durie, MD,as directed by  Robert Durie, MD while in the presence of Robert Durie, MD.   Complete physical exam   Patient: Robert Patton   DOB: June 08, 1950   69 y.o. Male  MRN: [id number] Visit Date: 03/21/2020  Today's healthcare provider: [name], MD   Chief Complaint  Patient presents with  . Annual Exam   Subjective    Robert Patton is a 69 y.o. male who presents today for a complete physical exam.  He reports consuming a general diet. Home exercise routine includes walking and biking. He generally feels well. He reports sleeping well. He does not have additional problems to discuss today.  He sees Dr. Yves Patton from urology for PSA in the ED. HPI  He says his blood pressure at home this morning was 148/83.  Past Medical History:  Diagnosis Date  . ED (erectile dysfunction)   . Hyperlipidemia   . Hypertension    Past Surgical History:  Procedure Laterality Date  . COLONOSCOPY  08/15/2009  . COLONOSCOPY WITH PROPOFOL N/A 10/08/2019   Procedure: COLONOSCOPY WITH PROPOFOL;  Surgeon: Lin Landsman, MD;  Location: [facility] ENDOSCOPY;  Service: Gastroenterology;  Laterality: N/A;  . EYE SURGERY Right 2016   CATARACT EXTRACTION  . INGUINAL HERNIA REPAIR Right 07/22/2017   Procedure: HERNIA REPAIR INGUINAL ADULT;  Surgeon: Royston Cowper, MD;  Location: [facility] ORS;  Service: Urology;  Laterality: Right;  . NO PAST SURGERIES     Social History   Socioeconomic History  . Marital status: Married    Spouse name: Not on file  . Number of children: Not on file  . Years of education: Not on file  . Highest education level: Not on file  Occupational History  . Not on file  Tobacco Use  . Smoking status: Never Smoker  . Smokeless tobacco: Never Used  Vaping Use  . Vaping Use: Never used  Substance and Sexual Activity  .  Alcohol use: No  . Drug use: No  . Sexual activity: Not on file  Other Topics Concern  . Not on file  Social History Narrative  . Not on file   Social Determinants of Health   Financial Resource Strain:   . Difficulty of Paying Living Expenses: Not on file  Food Insecurity:   . Worried About Charity fundraiser in the Last Year: Not on file  . Ran Out of Food in the Last Year: Not on file  Transportation Needs:   . Lack of Transportation (Medical): Not on file  . Lack of Transportation (Non-Medical): Not on file  Physical Activity:   . Days of Exercise per Week: Not on file  . Minutes of Exercise per Session: Not on file  Stress:   . Feeling of Stress : Not on file  Social Connections:   . Frequency of Communication with Friends and Family: Not on file  . Frequency of Social Gatherings with Friends and Family: Not on file  . Attends Religious Services: Not on file  . Active Member of Clubs or Organizations: Not on file  . Attends Archivist Meetings: Not on file  . Marital Status: Not on file  Intimate Partner Violence:   . Fear of Current or Ex-Partner: Not on file  . Emotionally Abused: Not on file  . Physically Abused: Not on file  .  Sexually Abused: Not on file   Family Status  Relation Name Status  . Mother  Deceased at age 51  . Father  Deceased at age 9  . Sister  Alive  . Brother  Alive  . Sister  Alive  . Sister  Alive  . Brother  Alive   Family History  Problem Relation Age of Onset  . Hypertension Brother    Allergies  Allergen Reactions  . Amlodipine Besylate     Insomnia  . Diltiazem     Had issues sexually    Patient Care Team: Jerrol Banana., MD as PCP - General (Family Medicine) Jerrol Banana., MD (Family Medicine) Christene Lye, MD (General Surgery)   Medications: Outpatient Medications Prior to Visit  Medication Sig  . acetaminophen (TYLENOL) 500 MG tablet Take 1,000 mg by mouth every 6 (six) hours  as needed (FOR PAIN.).  Marland Kitchen amLODipine (NORVASC) 5 MG tablet TAKE 1 TABLET(5 MG) BY MOUTH DAILY  . aspirin EC 81 MG tablet Take 81 mg by mouth daily.  . Calcium-Magnesium-Zinc (CAL-MAG-ZINC PO) Take 1 tablet by mouth daily.  . carboxymethylcellulose (REFRESH) 1 % ophthalmic solution Place 1-2 drops into both eyes 2 (two) times daily.  . cyclobenzaprine (FLEXERIL) 5 MG tablet Take 1-2 tablets (5-10 mg total) by mouth every evening.  . hydrochlorothiazide (HYDRODIURIL) 25 MG tablet TAKE 1 TABLET(25 MG) BY MOUTH DAILY  . losartan (COZAAR) 100 MG tablet TAKE 1 TABLET(100 MG) BY MOUTH DAILY  . Multiple Vitamin (MULTIVITAMIN WITH MINERALS) TABS tablet Take 1 tablet by mouth daily.  . naproxen (NAPROSYN) 500 MG tablet Take 1 tablet (500 mg total) by mouth 2 (two) times daily.  . tadalafil (CIALIS) 5 MG tablet Take 5 mg by mouth daily.  . Vitamin D, Ergocalciferol, (DRISDOL) 1.25 MG (50000 UT) CAPS capsule TAKE 1 CAPSULE BY MOUTH EVERY 7 DAYS   No facility-administered medications prior to visit.    Review of Systems  All other systems reviewed and are negative.      Objective    BP (!) 170/75 (BP Location: Left Arm, Patient Position: Sitting, Cuff Size: Large)   Pulse 73   Temp 98.3 F (36.8 C) (Oral)   Resp 16   Ht 5\' 10"  (1.778 m)   Wt 178 lb (80.7 kg)   SpO2 98%   BMI 25.54 kg/m     Physical Exam Vitals reviewed.      General Appearance:     Well developed, well nourished male. Alert, cooperative, in no acute distress, appears stated age  Head:    Normocephalic, without obvious abnormality, atraumatic  Eyes:    PERRL, conjunctiva/corneas clear, EOM's intact, fundi    benign, both eyes       Ears:    Normal TM's and external ear canals, both ears  Nose:   Nares normal, septum midline, mucosa normal, no drainage   or sinus tenderness  Throat:   Lips, mucosa, and tongue normal; teeth and gums normal  Neck:   Supple, symmetrical, trachea midline, no adenopathy;       thyroid:   No enlargement/tenderness/nodules; no carotid   bruit or JVD  Back:     Symmetric, no curvature, ROM normal, no CVA tenderness  Lungs:     Clear to auscultation bilaterally, respirations unlabored  Chest wall:    No tenderness or deformity  Heart:    Normal heart rate. Normal rhythm. No murmurs, rubs, or gallops.  S1 and S2 normal  Abdomen:     Soft, non-tender, bowel sounds active all four quadrants,    no masses, no organomegaly  Genitalia:    deferred  Rectal:    deferred  Extremities:   All extremities are intact. No cyanosis or edema  Pulses:   2+ and symmetric all extremities  Skin:   Skin color, texture, turgor normal, no rashes or lesions  Lymph nodes:   Cervical, supraclavicular, and axillary nodes normal  Neurologic:   CNII-XII intact. Normal strength, sensation and reflexes      throughout     Last depression screening scores PHQ 2/9 Scores 03/21/2020 03/11/2019 02/19/2018  PHQ - 2 Score 0 0 0  PHQ- 9 Score 0 5 0   Last fall risk screening Fall Risk  03/21/2020  Falls in the past year? 0  Number falls in past yr: 0  Injury with Fall? 0  Follow up Falls evaluation completed   Last Audit-C alcohol use screening Alcohol Use Disorder Test (AUDIT) 03/21/2020  1. How often do you have a drink containing alcohol? 0  2. How many drinks containing alcohol do you have on a typical day when you are drinking? 0  3. How often do you have six or more drinks on one occasion? 0  AUDIT-C Score 0  Alcohol Brief Interventions/Follow-up AUDIT Score <7 follow-up not indicated   A score of 3 or more in women, and 4 or more in men indicates increased risk for alcohol abuse, EXCEPT if all of the points are from question 1   No results found for any visits on 03/21/20.  Assessment & Plan    Routine Health Maintenance and Physical Exam  Exercise Activities and Dietary recommendations Goals   None     Immunization History  Administered Date(s) Administered  . Fluad Quad(high Dose  65+) 03/11/2019  . Influenza Split 04/08/2011  . Influenza, High Dose Seasonal PF 02/19/2018  . Pneumococcal Conjugate-13 07/20/2014  . Pneumococcal Polysaccharide-23 02/19/2018  . Tdap 04/08/2011  . Zoster 12/24/2011    Health Maintenance  Topic Date Due  . COVID-19 Vaccine (1) Never done  . INFLUENZA VACCINE  12/26/2019  . TETANUS/TDAP  04/07/2021  . COLONOSCOPY  10/07/2029  . Hepatitis C Screening  Completed  . PNA vac Low Risk Adult  Completed    Discussed health benefits of physical activity, and encouraged him to engage in regular exercise appropriate for his age and condition.  1. Annual physical exam   2. Mixed hyperlipidemia  - TSH - Lipid panel - Comprehensive Metabolic Panel (CMET)  3. Essential (primary) hypertension Stop HCTZ and try chlorthalidone at 50 mg daily with longer half-life than HCTZ.  Follow-up in 2 months. - CBC w/Diff/Platelet - TSH - Comprehensive Metabolic Panel (CMET) - chlorthalidone (HYGROTON) 50 MG tablet; Take 1 tablet (50 mg total) by mouth every morning.  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        Cristobal Advani Cranford Mon, MD  [facility] [phone] (phone) [phone] (fax)  [place]

## 2020-03-21 ENCOUNTER — Encounter: Payer: Self-pay | Admitting: Family Medicine

## 2020-03-21 ENCOUNTER — Other Ambulatory Visit: Payer: Self-pay

## 2020-03-21 ENCOUNTER — Other Ambulatory Visit: Payer: Self-pay | Admitting: *Deleted

## 2020-03-21 ENCOUNTER — Ambulatory Visit (INDEPENDENT_AMBULATORY_CARE_PROVIDER_SITE_OTHER): Payer: Commercial Managed Care - PPO | Admitting: Family Medicine

## 2020-03-21 VITALS — BP 170/75 | HR 73 | Temp 98.3°F | Resp 16 | Ht 70.0 in | Wt 178.0 lb

## 2020-03-21 DIAGNOSIS — I10 Essential (primary) hypertension: Secondary | ICD-10-CM | POA: Diagnosis not present

## 2020-03-21 DIAGNOSIS — N529 Male erectile dysfunction, unspecified: Secondary | ICD-10-CM

## 2020-03-21 DIAGNOSIS — Z0000 Encounter for general adult medical examination without abnormal findings: Secondary | ICD-10-CM | POA: Diagnosis not present

## 2020-03-21 DIAGNOSIS — E782 Mixed hyperlipidemia: Secondary | ICD-10-CM

## 2020-03-21 DIAGNOSIS — N400 Enlarged prostate without lower urinary tract symptoms: Secondary | ICD-10-CM | POA: Diagnosis not present

## 2020-03-21 MED ORDER — CHLORTHALIDONE 50 MG PO TABS: 50.0000 mg | ORAL_TABLET | ORAL | 1 refills | Status: DC

## 2020-03-21 NOTE — Patient Instructions (Addendum)
Stop hydrochlorothiazide. Start chlorthalidone 50 mg every morning.

## 2020-03-22 LAB — LIPID PANEL
Chol/HDL Ratio: 2.6 ratio (ref 0.0–5.0)
Cholesterol, Total: 194 mg/dL (ref 100–199)
HDL: 76 mg/dL (ref >39)
LDL Chol Calc (NIH): 104 mg/dL — ABNORMAL HIGH (ref 0–99)
Triglycerides: 76 mg/dL (ref 0–149)
VLDL Cholesterol Cal: 14 mg/dL (ref 5–40)

## 2020-03-22 LAB — CBC WITH DIFFERENTIAL/PLATELET
Basophils Absolute: 0 10*3/uL (ref 0.0–0.2)
Basos: 1 %
EOS (ABSOLUTE): 0.3 10*3/uL (ref 0.0–0.4)
Eos: 6 %
Hematocrit: 41.7 % (ref 37.5–51.0)
Hemoglobin: 13.9 g/dL (ref 13.0–17.7)
Immature Grans (Abs): 0 10*3/uL (ref 0.0–0.1)
Immature Granulocytes: 0 %
Lymphocytes Absolute: 1.6 10*3/uL (ref 0.7–3.1)
Lymphs: 38 %
MCH: 28.2 pg (ref 26.6–33.0)
MCHC: 33.3 g/dL (ref 31.5–35.7)
MCV: 85 fL (ref 79–97)
Monocytes Absolute: 0.5 10*3/uL (ref 0.1–0.9)
Monocytes: 11 %
Neutrophils Absolute: 1.9 10*3/uL (ref 1.4–7.0)
Neutrophils: 44 %
Platelets: 187 10*3/uL (ref 150–450)
RBC: 4.93 x10E6/uL (ref 4.14–5.80)
RDW: 13.8 % (ref 11.6–15.4)
WBC: 4.3 10*3/uL (ref 3.4–10.8)

## 2020-03-22 LAB — COMPREHENSIVE METABOLIC PANEL
ALT: 37 IU/L (ref 0–44)
AST: 33 IU/L (ref 0–40)
Albumin/Globulin Ratio: 1.6 (ref 1.2–2.2)
Albumin: 4.4 g/dL (ref 3.8–4.8)
Alkaline Phosphatase: 86 IU/L (ref 44–121)
BUN/Creatinine Ratio: 17 (ref 10–24)
BUN: 16 mg/dL (ref 8–27)
Bilirubin Total: 0.4 mg/dL (ref 0.0–1.2)
CO2: 24 mmol/L (ref 20–29)
Calcium: 9.4 mg/dL (ref 8.6–10.2)
Chloride: 102 mmol/L (ref 96–106)
Creatinine, Ser: 0.94 mg/dL (ref 0.76–1.27)
GFR calc Af Amer: 95 mL/min/{1.73_m2} (ref >59)
GFR calc non Af Amer: 82 mL/min/{1.73_m2} (ref >59)
Globulin, Total: 2.8 g/dL (ref 1.5–4.5)
Glucose: 93 mg/dL (ref 65–99)
Potassium: 3.8 mmol/L (ref 3.5–5.2)
Sodium: 140 mmol/L (ref 134–144)
Total Protein: 7.2 g/dL (ref 6.0–8.5)

## 2020-03-22 LAB — TSH: TSH: 2.38 u[IU]/mL (ref 0.450–4.500)

## 2020-03-24 ENCOUNTER — Telehealth: Payer: Self-pay | Admitting: Family Medicine

## 2020-03-24 NOTE — Telephone Encounter (Signed)
See result note from today, 03/24/20 @ 11:49 AM; rec'd results by nurse, per phone.

## 2020-03-24 NOTE — Telephone Encounter (Signed)
Copied from [place] [id number]. Topic: Quick Communication - Lab Results (Clinic Use ONLY) >> Mar 24, 2020 10:10 AM Scherrie Gerlach wrote: Pt calling back for lab results.  Ok for [facility] to advise per note in chart

## 2020-04-01 ENCOUNTER — Other Ambulatory Visit: Payer: Self-pay | Admitting: Family Medicine

## 2020-04-01 DIAGNOSIS — I10 Essential (primary) hypertension: Secondary | ICD-10-CM

## 2020-04-01 NOTE — Telephone Encounter (Signed)
Requested Prescriptions  Pending Prescriptions Disp Refills   losartan (COZAAR) 100 MG tablet [Pharmacy Med Name: LOSARTAN 100MG  TABLETS] 90 tablet 0    Sig: TAKE 1 TABLET(100 MG) BY MOUTH DAILY     Cardiovascular:  Angiotensin Receptor Blockers Failed - 04/01/2020  2:09 PM      Failed - Last BP in normal range    BP Readings from Last 1 Encounters:  03/21/20 (!) 170/75         Failed - Valid encounter within last 6 months    Recent Outpatient Visits          1 week ago Annual physical exam   [facility] Jerrol Banana., MD   6 months ago Essential (primary) hypertension   [facility] Jerrol Banana., MD   6 months ago Arthralgia of right temporomandibular joint   [facility] Birdie Sons, MD   10 months ago Essential (primary) hypertension   [facility] Jerrol Banana., MD   1 year ago Annual physical exam   [facility] Jerrol Banana., MD      Future Appointments            In 2 months Jerrol Banana., MD [facility]           Passed - Cr in normal range and within 180 days    Creat  Date Value Ref Range Status  02/04/2017 0.88 0.70 - 1.25 mg/dL Final    Comment:    For patients >27 years of age, the reference limit for Creatinine is approximately 13% higher for people identified as African-American. .    Creatinine, Ser  Date Value Ref Range Status  03/21/2020 0.94 0.76 - 1.27 mg/dL Final         Passed - K in normal range and within 180 days    Potassium  Date Value Ref Range Status  03/21/2020 3.8 3.5 - 5.2 mmol/L Final         Passed - Patient is not pregnant

## 2020-04-14 ENCOUNTER — Ambulatory Visit: Payer: Self-pay

## 2020-04-14 NOTE — Telephone Encounter (Signed)
Pt. Reports he started having right leg pain, below the knee, 3 weeks ago. Worse when walking up stairs. No redness or swelling. No known injury. Appointment made. Reason for Disposition . [1] MODERATE pain (e.g., interferes with normal activities, limping) AND [2] present > 3 days  Answer Assessment - Initial Assessment Questions 1. ONSET: "When did the pain start?"      3 weeks 2. LOCATION: "Where is the pain located?"      Right leg, below knee 3. PAIN: "How bad is the pain?"    (Scale 1-10; or mild, moderate, severe)   -  MILD (1-3): doesn't interfere with normal activities    -  MODERATE (4-7): interferes with normal activities (e.g., work or school) or awakens from sleep, limping    -  SEVERE (8-10): excruciating pain, unable to do any normal activities, unable to walk     8 4. WORK OR EXERCISE: "Has there been any recent work or exercise that involved this part of the body?"      No 5. CAUSE: "What do you think is causing the leg pain?"     Unsure 6. OTHER SYMPTOMS: "Do you have any other symptoms?" (e.g., chest pain, back pain, breathing difficulty, swelling, rash, fever, numbness, weakness)     No 7. PREGNANCY: "Is there any chance you are pregnant?" "When was your last menstrual period?"     n/a  Protocols used: LEG PAIN-A-AH

## 2020-04-14 NOTE — Telephone Encounter (Signed)
FYI. Thanks.

## 2020-04-17 ENCOUNTER — Encounter: Payer: Self-pay | Admitting: Adult Health

## 2020-04-17 ENCOUNTER — Ambulatory Visit
Admission: RE | Admit: 2020-04-17 | Discharge: 2020-04-17 | Disposition: A | Discharge status: Home / Self Care | Payer: Commercial Managed Care - PPO | Source: Ambulatory Visit | Attending: Adult Health | Admitting: Adult Health

## 2020-04-17 ENCOUNTER — Other Ambulatory Visit: Payer: Self-pay

## 2020-04-17 ENCOUNTER — Ambulatory Visit (INDEPENDENT_AMBULATORY_CARE_PROVIDER_SITE_OTHER): Payer: Commercial Managed Care - PPO | Admitting: Adult Health

## 2020-04-17 VITALS — BP 143/73 | HR 76 | Temp 97.9°F | Resp 16 | Wt 178.2 lb

## 2020-04-17 DIAGNOSIS — M25561 Pain in right knee: Secondary | ICD-10-CM | POA: Diagnosis not present

## 2020-04-17 DIAGNOSIS — M1711 Unilateral primary osteoarthritis, right knee: Secondary | ICD-10-CM | POA: Diagnosis not present

## 2020-04-17 DIAGNOSIS — M7662 Achilles tendinitis, left leg: Secondary | ICD-10-CM | POA: Diagnosis not present

## 2020-04-17 MED ORDER — PREDNISONE 10 MG (21) PO TBPK
ORAL_TABLET | ORAL | 0 refills | Status: DC
Start: 2020-04-17 — End: 2021-01-24

## 2020-04-17 NOTE — Progress Notes (Signed)
Mild tissue swelling,mild normal age related degeneration  if pain persists  after treatment or patient desires please place referral to orthopedics.

## 2020-04-17 NOTE — Patient Instructions (Signed)
Acute Knee Pain, Adult Many things can cause knee pain. Sometimes, knee pain is sudden (acute) and may be caused by damage, swelling, or irritation of the muscles and tissues that support your knee. The pain often goes away on its own with time and rest. If the pain does not go away, tests may be done to find out what is causing the pain. Follow these instructions at home: Pay attention to any changes in your symptoms. Take these actions to relieve your pain. If you have a knee sleeve or brace:   Wear the sleeve or brace as told by your doctor. Remove it only as told by your doctor.  Loosen the sleeve or brace if your toes: ? Tingle. ? Become numb. ? Turn cold and blue.  Keep the sleeve or brace clean.  If the sleeve or brace is not waterproof: ? Do not let it get wet. ? Cover it with a watertight covering when you take a bath or shower. Activity  Rest your knee.  Do not do things that cause pain.  Avoid activities where both feet leave the ground at the same time (high-impact activities). Examples are running, jumping rope, and doing jumping jacks.  Work with a physical therapist to make a safe exercise program, as told by your doctor. Managing pain, stiffness, and swelling   If told, put ice on the knee: ? Put ice in a plastic bag. ? Place a towel between your skin and the bag. ? Leave the ice on for 20 minutes, 2-3 times a day.  If told, put pressure (compression) on your injured knee to control swelling, give support, and help with discomfort. Compression may be done with an elastic bandage. General instructions  Take all medicines only as told by your doctor.  Raise (elevate) your knee while you are sitting or lying down. Make sure your knee is higher than your heart.  Sleep with a pillow under your knee.  Do not use any products that contain nicotine or tobacco. These include cigarettes, e-cigarettes, and chewing tobacco. These products may slow down healing. If  you need help quitting, ask your doctor.  If you are overweight, work with your doctor and a food expert (dietitian) to set goals to lose weight. Being overweight can make your knee hurt more.  Keep all follow-up visits as told by your doctor. This is important. Contact a doctor if:  The knee pain does not stop.  The knee pain changes or gets worse.  You have a fever along with knee pain.  Your knee feels warm when you touch it.  Your knee gives out or locks up. Get help right away if:  Your knee swells, and the swelling gets worse.  You cannot move your knee.  You have very bad knee pain. Summary  Many things can cause knee pain. The pain often goes away on its own with time and rest.  Your doctor may do tests to find out the cause of the pain.  Pay attention to any changes in your symptoms. Relieve your pain with rest, medicines, light activity, and use of ice.  Get help right away if you cannot move your knee or your knee pain is very bad. This information is not intended to replace advice given to you by your health care provider. Make sure you discuss any questions you have with your health care provider. Document Revised: 10/23/2017 Document Reviewed: 10/23/2017 Elsevier Patient Education  [place]. Rosen's Emergency Medicine: Concepts and  Clinical Practice (9th ed., pp. 0086-7619). [place], [place]: [place]. Retrieved from https://www.clinicalkey.com/#!/content/book/3-s2.0-B9780323354790001070?scrollTo=%23hl0000251">  Achilles Tendinitis  Achilles tendinitis is inflammation of the tough, cord-like band that attaches the lower leg muscles to the heel bone (Achilles tendon). This is usually caused by overusing the tendon and the ankle joint. Achilles tendinitis usually gets better over time with treatment and caring for yourself at home. It can take weeks or months to heal completely. What are the causes? This condition may be caused by:  A sudden  increase in exercise or activity, such as running.  Doing the same exercises or activities, such as jumping, over and over.  Not warming up calf muscles before exercising.  Exercising in shoes that are worn out or not made for exercise.  Having arthritis or a bone growth (spur) on the back of the heel bone. This can rub against the tendon and hurt it.  Age-related wear and tear. Tendons become less flexible with age and are more likely to be injured. What are the signs or symptoms? Common symptoms of this condition include:  Pain in the Achilles tendon or in the back of the leg, just above the heel. The pain usually gets worse with exercise.  Stiffness or soreness in the back of the leg, especially in the morning.  Swelling of the skin over the Achilles tendon.  Thickening of the tendon.  Trouble standing on tiptoe. How is this diagnosed? This condition is diagnosed based on your symptoms and a physical exam. You may have tests, including:  X-rays.  MRI. How is this treated? The goal of treatment is to relieve symptoms and help your injury heal. Treatment may include:  Decreasing or stopping activities that caused the tendinitis. This may mean switching to low-impact exercises like biking or swimming.  Icing the injured area.  Doing physical therapy, including strengthening and stretching exercises.  Taking NSAIDs, such as ibuprofen, to help relieve pain and swelling.  Using supportive shoes, wraps, heel lifts, or a walking boot (air cast).  Having surgery. This may be done if your symptoms do not improve after other treatments.  Using high-energy shock wave impulses to stimulate the healing process (extracorporeal shock wave therapy). This is rare.  Having an injection of medicines that help relieve inflammation (corticosteroids). This is rare. Follow these instructions at home: If you have an air cast:  Wear the air cast as told by your health care provider. Remove  it only as told by your health care provider.  Loosen it if your toes tingle, become numb, or turn cold and blue.  Keep it clean.  If the air cast is not waterproof: ? Do not let it get wet. ? Cover it with a watertight covering when you take a bath or shower. Managing pain, stiffness, and swelling   If directed, put ice on the injured area. To do this: ? If you have a removable air cast, remove it as told by your health care provider. ? Put ice in a plastic bag. ? Place a towel between your skin and the bag. ? Leave the ice on for 20 minutes, 2-3 times a day.  Move your toes often to reduce stiffness and swelling.  Raise (elevate) your foot above the level of your heart while you are sitting or lying down. Activity  Gradually return to your normal activities as told by your health care provider. Ask your health care provider what activities are safe for you.  Do not do activities that cause pain.  Consider doing low-impact exercises, like cycling or swimming.  Ask your health care provider when it is safe to drive if you have an air cast on your foot.  If physical therapy was prescribed, do exercises as told by your health care provider or physical therapist. General instructions  If directed, wrap your foot with an elastic bandage or other wrap. This can help to keep your tendon from moving too much while it heals. Your health care provider will show you how to wrap your foot correctly.  Wear supportive shoes or heel lifts only as told by your health care provider.  Take over-the-counter and prescription medicines only as told by your health care provider.  Keep all follow-up visits as told by your health care provider. This is important. Contact a health care provider if you:  Have symptoms that get worse.  Have pain that does not get better with medicine.  Develop new, unexplained symptoms.  Develop warmth and swelling in your foot.  Have a fever. Get help  right away if you:  Have a sudden popping sound or sensation in your Achilles tendon followed by severe pain.  Cannot move your toes or foot.  Cannot put any weight on your foot.  Your foot or toes become numb and look white or blue even after loosening your bandage or air cast. Summary  Achilles tendinitis is inflammation of the tough, cord-like band that attaches the lower leg muscles to the heel bone (Achilles tendon).  This condition is usually caused by overusing the tendon and the ankle joint. It can also be caused by arthritis or normal aging.  The most common symptoms of this condition include pain, swelling, or stiffness in the Achilles tendon or in the back of the leg.  This condition is usually treated by decreasing or stopping activities that caused the tendinitis, icing the injured area, taking NSAIDs, and doing physical therapy. This information is not intended to replace advice given to you by your health care provider. Make sure you discuss any questions you have with your health care provider. Document Revised: 09/28/2018 Document Reviewed: 09/28/2018 Elsevier Patient Education  [place].

## 2020-04-17 NOTE — Progress Notes (Signed)
Established patient visit   Patient: Robert Patton   DOB: 1950-10-07   69 y.o. Male  MRN: [id number] Visit Date: 04/17/2020  Today's healthcare provider: Marcille Buffy, FNP   Chief Complaint  Patient presents with   Leg Pain   Subjective    Leg Pain  There was no injury mechanism. The pain is present in the left foot and right leg (right knee, left back of foot ). The quality of the pain is described as aching. The pain is at a severity of 6/10. The pain is moderate (right knee bothering him worse , feels like his shoe rubbed back of left foot. ). The pain has been intermittent since onset. Pertinent negatives include no inability to bear weight, loss of motion, loss of sensation, muscle weakness, numbness or tingling. Associated symptoms comments: Sharp pain. . The symptoms are aggravated by weight bearing and movement (climbing stairs). He has tried acetaminophen, ice and heat for the symptoms. The treatment provided mild relief.    Right knee pain, hurts with climbing steps, getting into bed. Hurts when sitting and getting up. Denies any previous injury to right.   He is still riding bicycle stationary now.  Denies hip pain.  Onset two weeks ago. Denies any falls or trauma. Denies any previous surgeries.   Patient  denies any fever, body aches,chills, rash, chest pain, shortness of breath, nausea, vomiting, or diarrhea.        Medications: Outpatient Medications Prior to Visit  Medication Sig   acetaminophen (TYLENOL) 500 MG tablet Take 1,000 mg by mouth every 6 (six) hours as needed (FOR PAIN.).   amLODipine (NORVASC) 5 MG tablet TAKE 1 TABLET(5 MG) BY MOUTH DAILY   aspirin EC 81 MG tablet Take 81 mg by mouth daily.   Calcium-Magnesium-Zinc (CAL-MAG-ZINC PO) Take 1 tablet by mouth daily.   carboxymethylcellulose (REFRESH) 1 % ophthalmic solution Place 1-2 drops into both eyes 2 (two) times daily.   chlorthalidone (HYGROTON) 50 MG tablet Take 1 tablet  (50 mg total) by mouth every morning.   cyclobenzaprine (FLEXERIL) 5 MG tablet Take 1-2 tablets (5-10 mg total) by mouth every evening.   losartan (COZAAR) 100 MG tablet TAKE 1 TABLET(100 MG) BY MOUTH DAILY   Multiple Vitamin (MULTIVITAMIN WITH MINERALS) TABS tablet Take 1 tablet by mouth daily.   naproxen (NAPROSYN) 500 MG tablet Take 1 tablet (500 mg total) by mouth 2 (two) times daily.   tadalafil (CIALIS) 5 MG tablet Take 5 mg by mouth daily.   Vitamin D, Ergocalciferol, (DRISDOL) 1.25 MG (50000 UT) CAPS capsule TAKE 1 CAPSULE BY MOUTH EVERY 7 DAYS   No facility-administered medications prior to visit.    Review of Systems  Constitutional: Negative.   HENT: Negative.   Respiratory: Negative.   Cardiovascular: Negative.   Gastrointestinal: Negative.   Musculoskeletal: Positive for arthralgias. Negative for back pain, gait problem, joint swelling, myalgias, neck pain and neck stiffness.  Skin: Negative.   Neurological: Negative for tingling and numbness.  Psychiatric/Behavioral: Negative.       Objective    BP (!) 143/73    Pulse 76    Temp 97.9 F (36.6 C) (Oral)    Resp 16    Wt 178 lb 3.2 oz (80.8 kg)    SpO2 96%    BMI 25.57 kg/m  BP Readings from Last 3 Encounters:  04/17/20 (!) 143/73  03/21/20 (!) 170/75  10/08/19 (!) 151/78   Wt Readings from Last 3 Encounters:  04/17/20 178 lb 3.2 oz (80.8 kg)  03/21/20 178 lb (80.7 kg)  10/08/19 170 lb (77.1 kg)      Physical Exam Constitutional:      General: He is not in acute distress.    Appearance: Normal appearance. He is not ill-appearing, toxic-appearing or diaphoretic.  HENT:     Head: Normocephalic and atraumatic.     Right Ear: External ear normal.     Left Ear: External ear normal.     Nose: Nose normal.     Mouth/Throat:     Pharynx: No oropharyngeal exudate or posterior oropharyngeal erythema.  Eyes:     General: No scleral icterus.       Right eye: No discharge.        Left eye: No discharge.      Conjunctiva/sclera: Conjunctivae normal.  Cardiovascular:     Rate and Rhythm: Normal rate and regular rhythm.     Pulses: Normal pulses.     Heart sounds: Normal heart sounds. No murmur heard.  No friction rub. No gallop.   Pulmonary:     Effort: Pulmonary effort is normal. No respiratory distress.     Breath sounds: Normal breath sounds. No wheezing.  Abdominal:     General: There is no distension.     Tenderness: There is no abdominal tenderness. There is no right CVA tenderness or left CVA tenderness.  Musculoskeletal:     Cervical back: Normal range of motion.     Thoracic back: Normal.     Lumbar back: Normal.       Legs:     Comments: Gait is normal.   Lymphadenopathy:     Cervical: No cervical adenopathy.  Neurological:     General: No focal deficit present.     Mental Status: He is alert.       No results found for any visits on 04/17/20.  Assessment & Plan      Knee pain, right anterior - Plan: DG Knee Complete 4 Views Right  Achilles tendinitis of left lower extremity  Rest for 1 - 2 weeks from cycling on stationary bike.Compressive knee brace advised.My need orthopedics.    Apply a compressive ACE bandage. Rest and elevate the affected painful area.  Apply cold compresses intermittently as needed.  As pain recedes, begin normal activities slowly as tolerated.  Call if symptoms persist.   Orders Placed This Encounter  Procedures   DG Knee Complete 4 Views Right    Order Specific Question:   Reason for Exam (SYMPTOM  OR DIAGNOSIS REQUIRED)    Answer:   pain anterior knee x 2 weeks no known injury.    Order Specific Question:   Preferred imaging location?    Answer:   [facility] [employer] discussed. The patient was given clear instructions to go to ER or return to medical center if any red flags develop, symptoms do not improve, worsen or new problems develop. They verbalized understanding.     Return in about 1 week (around  04/24/2020), or if symptoms worsen or fail to improve, for at any time for any worsening symptoms, Go to Emergency room/ urgent care if worse.        Marcille Buffy, [place] [phone] (phone) [phone] (fax)  [place]

## 2020-04-24 ENCOUNTER — Telehealth: Payer: Self-pay

## 2020-04-24 DIAGNOSIS — M25561 Pain in right knee: Secondary | ICD-10-CM

## 2020-04-24 NOTE — Telephone Encounter (Signed)
Please advise 

## 2020-04-24 NOTE — Telephone Encounter (Signed)
Yes ok to refer, we discussed this in office. Please place referral.

## 2020-04-24 NOTE — Telephone Encounter (Signed)
Copied from [place] [id number]. Topic: General - Inquiry >> Apr 24, 2020  9:10 AM Greggory Keen D wrote: Reason for CRM: Pt called saying he was in to see Sharyn Lull last week for Knee and ankle pain.  He is still in pain and feels like he needs to be referred to ortho.  CB#  [phone]

## 2020-04-25 ENCOUNTER — Telehealth: Payer: Self-pay | Admitting: Family Medicine

## 2020-04-25 NOTE — Telephone Encounter (Signed)
Pt called to get Ortho referral information and also asked about getting a new pain medication for his knee pain/ he stated that the meds that he was prescribed by Sharyn Lull did not help and he is still in a lot of pain/ please advise/   Pt wanted to see Dr. Rosanna Randy but doesn't want to wait until 12.15.21 which Is the next available appt/

## 2020-04-26 NOTE — Telephone Encounter (Signed)
Patient called back to inquire about the referral to the Orthopedic doctor and asking Dr Rosanna Randy to send another Rx to the pharmacy for his pain since he is hurting very badly.  Ph# [phone]

## 2020-04-27 NOTE — Telephone Encounter (Signed)
Referral has been placed. 

## 2020-05-05 DIAGNOSIS — M179 Osteoarthritis of knee, unspecified: Secondary | ICD-10-CM | POA: Insufficient documentation

## 2020-05-05 DIAGNOSIS — M7662 Achilles tendinitis, left leg: Secondary | ICD-10-CM | POA: Diagnosis not present

## 2020-05-05 DIAGNOSIS — M1711 Unilateral primary osteoarthritis, right knee: Secondary | ICD-10-CM | POA: Diagnosis not present

## 2020-05-24 ENCOUNTER — Other Ambulatory Visit: Payer: Self-pay | Admitting: Family Medicine

## 2020-05-24 DIAGNOSIS — M1711 Unilateral primary osteoarthritis, right knee: Secondary | ICD-10-CM | POA: Diagnosis not present

## 2020-05-24 NOTE — Telephone Encounter (Signed)
Requested medication (s) are due for refill today: yes  Requested medication (s) are on the active medication list: yes  Last refill:  06/01/19 #12 with 3 refills  Future visit scheduled: yes  Notes to clinic:  Please review for refill. Refill not delegated per protocol. Last Vit D level noted to be in 2019    Requested Prescriptions  Pending Prescriptions Disp Refills   Vitamin D, Ergocalciferol, (DRISDOL) 1.25 MG (50000 UNIT) CAPS capsule [Pharmacy Med Name: VITAMIN D2 50,000IU (ERGO) CAP RX] 12 capsule 3    Sig: TAKE 1 CAPSULE BY MOUTH EVERY 7 DAYS      Endocrinology:  Vitamins - Vitamin D Supplementation Failed - 05/24/2020 10:13 AM      Failed - 50,000 IU strengths are not delegated      Failed - Phosphate in normal range and within 360 days    No results found for: PHOS        Failed - Vitamin D in normal range and within 360 days    Vit D, 25-Hydroxy  Date Value Ref Range Status  02/19/2018 54.0 30.0 - 100.0 ng/mL Final    Comment:    Vitamin D deficiency has been defined by the Institute of Medicine and an Endocrine Society practice guideline as a level of serum 25-OH vitamin D less than 20 ng/mL (1,2). The Endocrine Society went on to further define vitamin D insufficiency as a level between 21 and 29 ng/mL (2). 1. IOM (Institute of Medicine). 2010. Dietary reference    intakes for calcium and D. [place]: The    [employer]. 2. Holick MF, Binkley [place], Bischoff-Ferrari HA, et al.    Evaluation, treatment, and prevention of vitamin D    deficiency: an Endocrine Society clinical practice    guideline. JCEM. 2011 Jul; 96(7):1911-30.           Passed - Ca in normal range and within 360 days    Calcium  Date Value Ref Range Status  03/21/2020 9.4 8.6 - 10.2 mg/dL Final          Passed - Valid encounter within last 12 months    Recent Outpatient Visits           1 month ago Knee pain, right anterior   [facility] [place],  Eula Fried, FNP   2 months ago Annual physical exam   [facility] Maple Hudson., MD   8 months ago Essential (primary) hypertension   [facility] Maple Hudson., MD   8 months ago Arthralgia of right temporomandibular joint   [facility] Malva Limes, MD   1 year ago Essential (primary) hypertension   [facility] Maple Hudson., MD       Future Appointments             In 4 weeks Maple Hudson., MD [facility]

## 2020-06-07 ENCOUNTER — Encounter: Payer: Commercial Managed Care - PPO | Admitting: Family Medicine

## 2020-06-09 DIAGNOSIS — M79672 Pain in left foot: Secondary | ICD-10-CM | POA: Diagnosis not present

## 2020-06-09 DIAGNOSIS — M25561 Pain in right knee: Secondary | ICD-10-CM | POA: Diagnosis not present

## 2020-06-14 DIAGNOSIS — M79672 Pain in left foot: Secondary | ICD-10-CM | POA: Diagnosis not present

## 2020-06-14 DIAGNOSIS — M1711 Unilateral primary osteoarthritis, right knee: Secondary | ICD-10-CM | POA: Diagnosis not present

## 2020-06-21 ENCOUNTER — Telehealth: Payer: Self-pay | Admitting: Family Medicine

## 2020-06-21 ENCOUNTER — Ambulatory Visit (INDEPENDENT_AMBULATORY_CARE_PROVIDER_SITE_OTHER): Payer: Commercial Managed Care - PPO | Admitting: Family Medicine

## 2020-06-21 ENCOUNTER — Other Ambulatory Visit: Payer: Self-pay

## 2020-06-21 ENCOUNTER — Encounter: Payer: Self-pay | Admitting: Family Medicine

## 2020-06-21 VITALS — BP 126/71 | HR 69 | Temp 98.3°F | Wt 176.0 lb

## 2020-06-21 DIAGNOSIS — M25561 Pain in right knee: Secondary | ICD-10-CM | POA: Diagnosis not present

## 2020-06-21 DIAGNOSIS — I10 Essential (primary) hypertension: Secondary | ICD-10-CM | POA: Diagnosis not present

## 2020-06-21 NOTE — Telephone Encounter (Signed)
Pt would like this visit to be a phone call

## 2020-06-21 NOTE — Telephone Encounter (Signed)
Copied from [place] [id number]. Topic: Medicare AWV >> Jun 21, 2020  1:46 PM Cher Nakai R wrote: Reason for CRM:   No answer unable to leave a message for patient to call back and schedule Medicare Annual Wellness Visit (AWV) in office.   If not able to come in office, please offer to do virtually or by telephone.   Last AWV 12/21/2015  Please schedule at anytime with [facility] Health Advisor.  If any questions, please contact me at [phone]

## 2020-06-21 NOTE — Progress Notes (Signed)
Established patient visit   Patient: Robert Patton   DOB: [birth year]   70 y.o. Male  MRN: [id number] Visit Date: 06/21/2020  Today's healthcare provider: [name], MD   Chief Complaint  Patient presents with  . Hypertension   Subjective    HPI  Patient feeling well.  Pressure at home runs 110-120/70.  He just finished physical therapy for his knee.  He has had all 3 Covid vaccines. Hypertension, follow-up  BP Readings from Last 3 Encounters:  06/21/20 126/71  04/17/20 (!) 143/73  03/21/20 (!) 170/75   Wt Readings from Last 3 Encounters:  06/21/20 176 lb (79.8 kg)  04/17/20 178 lb 3.2 oz (80.8 kg)  03/21/20 178 lb (80.7 kg)     He was last seen for hypertension 3 months ago.  BP at that visit was 170/75. Management since that visit includes; Stopped HCTZ and try chlorthalidone at 50 mg daily with longer half-life than HCTZ.  Follow-up in 2 months. He reports excellent compliance with treatment. He is not having side effects.  He is exercising. He is adherent to low salt diet.   Outside blood pressures are 130's/70's.  He does not smoke.  Use of agents associated with hypertension: none.   ---------------------------------------------------------------------------------------------------       Medications: Outpatient Medications Prior to Visit  Medication Sig  . acetaminophen (TYLENOL) 500 MG tablet Take 1,000 mg by mouth every 6 (six) hours as needed (FOR PAIN.).  Marland Kitchen amLODipine (NORVASC) 5 MG tablet TAKE 1 TABLET(5 MG) BY MOUTH DAILY  . aspirin EC 81 MG tablet Take 81 mg by mouth daily.  . Calcium-Magnesium-Zinc (CAL-MAG-ZINC PO) Take 1 tablet by mouth daily.  . carboxymethylcellulose 1 % ophthalmic solution Place 1-2 drops into both eyes 2 (two) times daily.  . chlorthalidone (HYGROTON) 50 MG tablet Take 1 tablet (50 mg total) by mouth every morning.  . cyclobenzaprine (FLEXERIL) 5 MG tablet Take 1-2 tablets (5-10 mg total) by mouth every  evening.  Marland Kitchen losartan (COZAAR) 100 MG tablet TAKE 1 TABLET(100 MG) BY MOUTH DAILY  . Multiple Vitamin (MULTIVITAMIN WITH MINERALS) TABS tablet Take 1 tablet by mouth daily.  . tadalafil (CIALIS) 5 MG tablet Take 5 mg by mouth daily.  . Vitamin D, Ergocalciferol, (DRISDOL) 1.25 MG (50000 UNIT) CAPS capsule TAKE 1 CAPSULE BY MOUTH EVERY 7 DAYS  . naproxen (NAPROSYN) 500 MG tablet Take 1 tablet (500 mg total) by mouth 2 (two) times daily.  . predniSONE (STERAPRED UNI-PAK 21 TAB) 10 MG (21) TBPK tablet PO: Take 6 tablets on day 1:Take 5 tablets day 2:Take 4 tablets day 3: Take 3 tablets day 4:Take 2 tablets day five: 5 Take 1 tablet day 6   No facility-administered medications prior to visit.    Review of Systems  Constitutional: Negative.  Negative for appetite change, chills and fever.  Respiratory: Negative.  Negative for chest tightness, shortness of breath and wheezing.   Cardiovascular: Negative.  Negative for chest pain and palpitations.  Gastrointestinal: Negative for abdominal pain, nausea and vomiting.  Neurological: Negative for dizziness, syncope, light-headedness and headaches.        Objective    BP 126/71 (BP Location: Right Arm, Patient Position: Sitting, Cuff Size: Large)   Pulse 69   Temp 98.3 F (36.8 C) (Oral)   Wt 176 lb (79.8 kg)   BMI 25.25 kg/m      Physical Exam Vitals and nursing note reviewed. Exam conducted with a chaperone present.  Constitutional:      Appearance: Normal appearance.  Cardiovascular:     Rate and Rhythm: Normal rate and regular rhythm.  Pulmonary:     Effort: Pulmonary effort is normal.     Breath sounds: Normal breath sounds.  Abdominal:     Palpations: Abdomen is soft.  Musculoskeletal:        General: Normal range of motion.  Neurological:     Mental Status: He is alert.  Psychiatric:        Mood and Affect: Mood normal.        Behavior: Behavior normal.       No results found for any visits on 06/21/20.  Assessment &  Plan     1. Essential (primary) hypertension Excellent control.  2. Knee pain, right anterior Improved with physical therapy.   No follow-ups on file.         Richard Cranford Mon, MD  [facility] [phone] (phone) [phone] (fax)  [place]

## 2020-06-22 NOTE — Progress Notes (Signed)
Subjective:   Robert Patton is a 70 y.o. male who presents for Medicare Annual/Subsequent preventive examination.  I connected with Ballard Budney today by telephone and verified that I am speaking with the correct person using two identifiers. Location patient: home Location provider: work Persons participating in the virtual visit: patient, provider.   I discussed the limitations, risks, security and privacy concerns of performing an evaluation and management service by telephone and the availability of in person appointments. I also discussed with the patient that there may be a patient responsible charge related to this service. The patient expressed understanding and verbally consented to this telephonic visit.    Interactive audio and video telecommunications were attempted between this provider and patient, however failed, due to patient having technical difficulties OR patient did not have access to video capability.  We continued and completed visit with audio only.   Review of Systems    N/A  Cardiac Risk Factors include: advanced age (>20men, >28 women);hypertension     Objective:    There were no vitals filed for this visit. There is no height or weight on file to calculate BMI.  Advanced Directives 06/26/2020 10/08/2019 07/21/2017 02/04/2017 12/21/2015  Does Patient Have a Medical Advance Directive? No No No No No  Would patient like information on creating a medical advance directive? No - Patient declined - No - Patient declined - -    Current Medications (verified) Outpatient Encounter Medications as of 06/26/2020  Medication Sig  . acetaminophen (TYLENOL) 500 MG tablet Take 1,000 mg by mouth every 6 (six) hours as needed (FOR PAIN.).  Marland Kitchen amLODipine (NORVASC) 5 MG tablet TAKE 1 TABLET(5 MG) BY MOUTH DAILY  . Calcium-Magnesium-Zinc (CAL-MAG-ZINC PO) Take 1 tablet by mouth daily.  . carboxymethylcellulose 1 % ophthalmic solution Place 1-2 drops into both eyes 2 (two) times  daily.  . cyclobenzaprine (FLEXERIL) 5 MG tablet Take 1-2 tablets (5-10 mg total) by mouth every evening.  Marland Kitchen losartan (COZAAR) 100 MG tablet TAKE 1 TABLET(100 MG) BY MOUTH DAILY  . meloxicam (MOBIC) 15 MG tablet Take 15 mg by mouth daily.  . Multiple Vitamin (MULTIVITAMIN WITH MINERALS) TABS tablet Take 1 tablet by mouth daily.  . traMADol (ULTRAM) 50 MG tablet Take 50 mg by mouth every 6 (six) hours as needed.  . Vitamin D, Ergocalciferol, (DRISDOL) 1.25 MG (50000 UNIT) CAPS capsule TAKE 1 CAPSULE BY MOUTH EVERY 7 DAYS  . aspirin EC 81 MG tablet Take 81 mg by mouth daily. (Patient not taking: Reported on 06/26/2020)  . chlorthalidone (HYGROTON) 50 MG tablet Take 1 tablet (50 mg total) by mouth every morning. (Patient not taking: Reported on 06/26/2020)  . naproxen (NAPROSYN) 500 MG tablet Take 1 tablet (500 mg total) by mouth 2 (two) times daily. (Patient not taking: Reported on 06/26/2020)  . predniSONE (STERAPRED UNI-PAK 21 TAB) 10 MG (21) TBPK tablet PO: Take 6 tablets on day 1:Take 5 tablets day 2:Take 4 tablets day 3: Take 3 tablets day 4:Take 2 tablets day five: 5 Take 1 tablet day 6 (Patient not taking: Reported on 06/26/2020)  . tadalafil (CIALIS) 5 MG tablet Take 5 mg by mouth daily. (Patient not taking: Reported on 06/26/2020)   No facility-administered encounter medications on file as of 06/26/2020.    Allergies (verified) Amlodipine besylate and Diltiazem   History: Past Medical History:  Diagnosis Date  . ED (erectile dysfunction)   . Hyperlipidemia   . Hypertension    Past Surgical History:  Procedure  Laterality Date  . COLONOSCOPY  08/15/2009  . COLONOSCOPY WITH PROPOFOL N/A 10/08/2019   Procedure: COLONOSCOPY WITH PROPOFOL;  Surgeon: Lin Landsman, MD;  Location: [facility] ENDOSCOPY;  Service: Gastroenterology;  Laterality: N/A;  . EYE SURGERY Right 2016   CATARACT EXTRACTION  . INGUINAL HERNIA REPAIR Right 07/22/2017   Procedure: HERNIA REPAIR INGUINAL ADULT;  Surgeon:  Royston Cowper, MD;  Location: [facility] ORS;  Service: Urology;  Laterality: Right;  . NO PAST SURGERIES     Family History  Problem Relation Age of Onset  . Hypertension Brother    Social History   Socioeconomic History  . Marital status: Married    Spouse name: Not on file  . Number of children: 3  . Years of education: Not on file  . Highest education level: Bachelor's degree (e.g., BA, AB, BS)  Occupational History  . Occupation: CNA  Tobacco Use  . Smoking status: Never Smoker  . Smokeless tobacco: Never Used  Vaping Use  . Vaping Use: Never used  Substance and Sexual Activity  . Alcohol use: No  . Drug use: No  . Sexual activity: Not on file  Other Topics Concern  . Not on file  Social History Narrative  . Not on file   Social Determinants of Health   Financial Resource Strain: Low Risk   . Difficulty of Paying Living Expenses: Not hard at all  Food Insecurity: No Food Insecurity  . Worried About Charity fundraiser in the Last Year: Never true  . Ran Out of Food in the Last Year: Never true  Transportation Needs: No Transportation Needs  . Lack of Transportation (Medical): No  . Lack of Transportation (Non-Medical): No  Physical Activity: Insufficiently Active  . Days of Exercise per Week: 7 days  . Minutes of Exercise per Session: 20 min  Stress: No Stress Concern Present  . Feeling of Stress : Not at all  Social Connections: Socially Integrated  . Frequency of Communication with Friends and Family: More than three times a week  . Frequency of Social Gatherings with Friends and Family: More than three times a week  . Attends Religious Services: More than 4 times per year  . Active Member of Clubs or Organizations: Yes  . Attends Archivist Meetings: More than 4 times per year  . Marital Status: Married    Tobacco Counseling Counseling given: Not Answered   Clinical Intake:  Pre-visit preparation completed: Yes  Pain : No/denies pain      Nutritional Risks: None Diabetes: No  How often do you need to have someone help you when you read instructions, pamphlets, or other written materials from your doctor or pharmacy?: 1 - Never  Diabetic? No  Interpreter Needed?: No  Information entered by :: [facility], LPN   Activities of Daily Living In your present state of health, do you have any difficulty performing the following activities: 06/26/2020 06/21/2020  Hearing? N N  Vision? N N  Difficulty concentrating or making decisions? N N  Walking or climbing stairs? N N  Dressing or bathing? N N  Doing errands, shopping? N N  Preparing Food and eating ? N -  Using the Toilet? N -  In the past six months, have you accidently leaked urine? N -  Do you have problems with loss of bowel control? N -  Managing your Medications? N -  Managing your Finances? N -  Housekeeping or managing your Housekeeping? N -  Some recent  data might be hidden    Patient Care Team: Maple HudsonGilbert, Richard L Jr., MD as PCP - General (Family Medicine) Toney ReilVanga, Rohini Reddy, MD as Consulting Physician (Gastroenterology) Orson ApeWolff, Michael R, MD as Consulting Physician (Urology)  Indicate any recent Medical Services you may have received from other than [facility] providers in the past year (date may be approximate).     Assessment:   This is a routine wellness examination for [place].  Hearing/Vision screen No exam data present  Dietary issues and exercise activities discussed: Current Exercise Habits: Home exercise routine, Type of exercise: Other - see comments (rides a stationary bike), Time (Minutes): 15, Frequency (Times/Week): 7, Weekly Exercise (Minutes/Week): 105, Intensity: Mild, Exercise limited by: None identified  Goals   None    Depression Screen PHQ 2/9 Scores 06/26/2020 06/21/2020 03/21/2020 03/11/2019 02/19/2018 02/04/2017 12/21/2015  PHQ - 2 Score 0 0 0 0 0 0 0  PHQ- 9 Score - 0 0 5 0 0 -    Fall Risk Fall Risk  06/26/2020 06/21/2020  03/21/2020 03/11/2019 02/19/2018  Falls in the past year? 0 0 0 0 No  Number falls in past yr: 0 0 0 0 -  Injury with Fall? 0 0 0 0 -  Follow up - - Falls evaluation completed - -    FALL RISK PREVENTION PERTAINING TO THE HOME:  Any stairs in or around the home? Yes  If so, are there any without handrails? No  Home free of loose throw rugs in walkways, pet beds, electrical cords, etc? Yes  Adequate lighting in your home to reduce risk of falls? Yes   ASSISTIVE DEVICES UTILIZED TO PREVENT FALLS:  Life alert? No  Use of a cane, walker or w/c? No  Grab bars in the bathroom? No  Shower chair or bench in shower? No  Elevated toilet seat or a handicapped toilet? Yes    Cognitive Function: Normal cognitive status assessed by observation by this Nurse Health Advisor. No abnormalities found.       6CIT Screen 02/04/2017  What Year? 0 points  What month? 0 points  What time? 0 points  Count back from 20 0 points  Months in reverse 0 points  Repeat phrase 10 points  Total Score 10    Immunizations Immunization History  Administered Date(s) Administered  . Fluad Quad(high Dose 65+) 03/11/2019, 03/14/2020  . Influenza Split 04/08/2011  . Influenza, High Dose Seasonal PF 02/19/2018  . Moderna Sars-Covid-2 Vaccination 07/05/2019, 08/02/2019  . Pneumococcal Conjugate-13 07/20/2014  . Pneumococcal Polysaccharide-23 02/19/2018  . Tdap 04/08/2011  . Zoster 12/24/2011    TDAP status: Up to date  Flu Vaccine status: Up to date  Pneumococcal vaccine status: Up to date  Covid-19 vaccine status: Completed vaccines  Qualifies for Shingles Vaccine? Yes   Zostavax completed Yes   Shingrix Completed?: No.    Education has been provided regarding the importance of this vaccine. Patient has been advised to call insurance company to determine out of pocket expense if they have not yet received this vaccine. Advised may also receive vaccine at local pharmacy or Health Dept. Verbalized  acceptance and understanding.  Screening Tests Health Maintenance  Topic Date Due  . COVID-19 Vaccine (3 - Booster for Moderna series) 02/02/2020  . TETANUS/TDAP  04/07/2021  . COLONOSCOPY (Pts [id number] Insurance coverage will need to be confirmed)  10/07/2029  . INFLUENZA VACCINE  Completed  . Hepatitis C Screening  Completed  . PNA vac Low Risk Adult  Completed  Health Maintenance  Health Maintenance Due  Topic Date Due  . COVID-19 Vaccine (3 - Booster for Moderna series) 02/02/2020    Colorectal cancer screening: Type of screening: Colonoscopy. Completed 10/08/19. Repeat every 10 years  Lung Cancer Screening: (Low Dose CT Chest recommended if Age 15-80 years, 30 pack-year currently smoking OR have quit w/in 15years.) does not qualify.   Additional Screening:  Hepatitis C Screening: Up to date  Vision Screening: Recommended annual ophthalmology exams for early detection of glaucoma and other disorders of the eye. Is the patient up to date with their annual eye exam?  Yes  Who is the provider or what is the name of the office in which the patient attends annual eye exams? Pt is unsure of providers name or practice but states it is in [place].  If pt is not established with a provider, would they like to be referred to a provider to establish care? No .   Dental Screening: Recommended annual dental exams for proper oral hygiene  Community Resource Referral / Chronic Care Management: CRR required this visit?  No   CCM required this visit?  No      Plan:     I have personally reviewed and noted the following in the patient's chart:   . Medical and social history . Use of alcohol, tobacco or illicit drugs  . Current medications and supplements . Functional ability and status . Nutritional status . Physical activity . Advanced directives . List of other physicians . Hospitalizations, surgeries, and ER visits in previous 12 months . Vitals . Screenings to  include cognitive, depression, and falls . Referrals and appointments  In addition, I have reviewed and discussed with patient certain preventive protocols, quality metrics, and best practice recommendations. A written personalized care plan for preventive services as well as general preventive health recommendations were provided to patient.     Xavian Hardcastle [place], [place]   [id number]   Nurse Notes: None.

## 2020-06-23 DIAGNOSIS — M7662 Achilles tendinitis, left leg: Secondary | ICD-10-CM | POA: Diagnosis not present

## 2020-06-23 DIAGNOSIS — M1711 Unilateral primary osteoarthritis, right knee: Secondary | ICD-10-CM | POA: Diagnosis not present

## 2020-06-26 ENCOUNTER — Other Ambulatory Visit: Payer: Self-pay

## 2020-06-26 ENCOUNTER — Ambulatory Visit (INDEPENDENT_AMBULATORY_CARE_PROVIDER_SITE_OTHER): Payer: Commercial Managed Care - PPO

## 2020-06-26 DIAGNOSIS — Z0000 Encounter for general adult medical examination without abnormal findings: Secondary | ICD-10-CM | POA: Diagnosis not present

## 2020-06-26 NOTE — Patient Instructions (Signed)
Mr. Robert Patton , Thank you for taking time to come for your Medicare Wellness Visit. I appreciate your ongoing commitment to your health goals. Please review the following plan we discussed and let me know if I can assist you in the future.   Screening recommendations/referrals: Colonoscopy: Up to date, due 09/2029 Recommended yearly ophthalmology/optometry visit for glaucoma screening and checkup Recommended yearly dental visit for hygiene and checkup  Vaccinations: Influenza vaccine: Done 03/14/20 Pneumococcal vaccine: Completed series Tdap vaccine: Up to date, due 03/2021 Shingles vaccine: Shingrix discussed. Please contact your pharmacy for coverage information.     Advanced directives: Advance directive discussed with you today. Even though you declined this today please call our office should you change your mind and we can give you the proper paperwork for you to fill out.  Conditions/risks identified: None.  Next appointment: 12/21/70 @ 8:00 AM with Dr Rosanna Randy   Preventive Care 72 Years and Older, Male Preventive care refers to lifestyle choices and visits with your health care provider that can promote health and wellness. What does preventive care include?  A yearly physical exam. This is also called an annual well check.  Dental exams once or twice a year.  Routine eye exams. Ask your health care provider how often you should have your eyes checked.  Personal lifestyle choices, including:  Daily care of your teeth and gums.  Regular physical activity.  Eating a healthy diet.  Avoiding tobacco and drug use.  Limiting alcohol use.  Practicing safe sex.  Taking low doses of aspirin every day.  Taking vitamin and mineral supplements as recommended by your health care provider. What happens during an annual well check? The services and screenings done by your health care provider during your annual well check will depend on your age, overall health, lifestyle risk  factors, and family history of disease. Counseling  Your health care provider may ask you questions about your:  Alcohol use.  Tobacco use.  Drug use.  Emotional well-being.  Home and relationship well-being.  Sexual activity.  Eating habits.  History of falls.  Memory and ability to understand (cognition).  Work and work Statistician. Screening  You may have the following tests or measurements:  Height, weight, and BMI.  Blood pressure.  Lipid and cholesterol levels. These may be checked every 5 years, or more frequently if you are over 75 years old.  Skin check.  Lung cancer screening. You may have this screening every year starting at age 72 if you have a 30-pack-year history of smoking and currently smoke or have quit within the past 15 years.  Fecal occult blood test (FOBT) of the stool. You may have this test every year starting at age 40.  Flexible sigmoidoscopy or colonoscopy. You may have a sigmoidoscopy every 5 years or a colonoscopy every 10 years starting at age 72.  Prostate cancer screening. Recommendations will vary depending on your family history and other risks.  Hepatitis C blood test.  Hepatitis B blood test.  Sexually transmitted disease (STD) testing.  Diabetes screening. This is done by checking your blood sugar (glucose) after you have not eaten for a while (fasting). You may have this done every 1-3 years.  Abdominal aortic aneurysm (AAA) screening. You may need this if you are a current or former smoker.  Osteoporosis. You may be screened starting at age 72 if you are at high risk. Talk with your health care provider about your test results, treatment options, and if necessary, the need  for more tests. Vaccines  Your health care provider may recommend certain vaccines, such as:  Influenza vaccine. This is recommended every year.  Tetanus, diphtheria, and acellular pertussis (Tdap, Td) vaccine. You may need a Td booster every 10  years.  Zoster vaccine. You may need this after age 72.  Pneumococcal 13-valent conjugate (PCV13) vaccine. One dose is recommended after age 72.  Pneumococcal polysaccharide (PPSV23) vaccine. One dose is recommended after age 72. Talk to your health care provider about which screenings and vaccines you need and how often you need them. This information is not intended to replace advice given to you by your health care provider. Make sure you discuss any questions you have with your health care provider. Document Released: 06/09/2015 Document Revised: 01/31/2016 Document Reviewed: 03/14/2015 Elsevier Interactive Patient Education  2017 [place] Prevention in the Home Falls can cause injuries. They can happen to people of all ages. There are many things you can do to make your home safe and to help prevent falls. What can I do on the outside of my home?  Regularly fix the edges of walkways and driveways and fix any cracks.  Remove anything that might make you trip as you walk through a door, such as a raised step or threshold.  Trim any bushes or trees on the path to your home.  Use bright outdoor lighting.  Clear any walking paths of anything that might make someone trip, such as rocks or tools.  Regularly check to see if handrails are loose or broken. Make sure that both sides of any steps have handrails.  Any raised decks and porches should have guardrails on the edges.  Have any leaves, snow, or ice cleared regularly.  Use sand or salt on walking paths during winter.  Clean up any spills in your garage right away. This includes oil or grease spills. What can I do in the bathroom?  Use night lights.  Install grab bars by the toilet and in the tub and shower. Do not use towel bars as grab bars.  Use non-skid mats or decals in the tub or shower.  If you need to sit down in the shower, use a plastic, non-slip stool.  Keep the floor dry. Clean up any water that  spills on the floor as soon as it happens.  Remove soap buildup in the tub or shower regularly.  Attach bath mats securely with double-sided non-slip rug tape.  Do not have throw rugs and other things on the floor that can make you trip. What can I do in the bedroom?  Use night lights.  Make sure that you have a light by your bed that is easy to reach.  Do not use any sheets or blankets that are too big for your bed. They should not hang down onto the floor.  Have a firm chair that has side arms. You can use this for support while you get dressed.  Do not have throw rugs and other things on the floor that can make you trip. What can I do in the kitchen?  Clean up any spills right away.  Avoid walking on wet floors.  Keep items that you use a lot in easy-to-reach places.  If you need to reach something above you, use a strong step stool that has a grab bar.  Keep electrical cords out of the way.  Do not use floor polish or wax that makes floors slippery. If you must use wax, use  non-skid floor wax.  Do not have throw rugs and other things on the floor that can make you trip. What can I do with my stairs?  Do not leave any items on the stairs.  Make sure that there are handrails on both sides of the stairs and use them. Fix handrails that are broken or loose. Make sure that handrails are as long as the stairways.  Check any carpeting to make sure that it is firmly attached to the stairs. Fix any carpet that is loose or worn.  Avoid having throw rugs at the top or bottom of the stairs. If you do have throw rugs, attach them to the floor with carpet tape.  Make sure that you have a light switch at the top of the stairs and the bottom of the stairs. If you do not have them, ask someone to add them for you. What else can I do to help prevent falls?  Wear shoes that:  Do not have high heels.  Have rubber bottoms.  Are comfortable and fit you well.  Are closed at the  toe. Do not wear sandals.  If you use a stepladder:  Make sure that it is fully opened. Do not climb a closed stepladder.  Make sure that both sides of the stepladder are locked into place.  Ask someone to hold it for you, if possible.  Clearly mark and make sure that you can see:  Any grab bars or handrails.  First and last steps.  Where the edge of each step is.  Use tools that help you move around (mobility aids) if they are needed. These include:  Canes.  Walkers.  Scooters.  Crutches.  Turn on the lights when you go into a dark area. Replace any light bulbs as soon as they burn out.  Set up your furniture so you have a clear path. Avoid moving your furniture around.  If any of your floors are uneven, fix them.  If there are any pets around you, be aware of where they are.  Review your medicines with your doctor. Some medicines can make you feel dizzy. This can increase your chance of falling. Ask your doctor what other things that you can do to help prevent falls. This information is not intended to replace advice given to you by your health care provider. Make sure you discuss any questions you have with your health care provider. Document Released: 03/09/2009 Document Revised: 10/19/2015 Document Reviewed: 06/17/2014 Elsevier Interactive Patient Education  2017 [employer].

## 2020-06-28 ENCOUNTER — Other Ambulatory Visit: Payer: Self-pay | Admitting: Family Medicine

## 2020-06-28 DIAGNOSIS — I10 Essential (primary) hypertension: Secondary | ICD-10-CM

## 2020-07-04 ENCOUNTER — Other Ambulatory Visit: Payer: Self-pay | Admitting: Family Medicine

## 2020-07-04 DIAGNOSIS — I10 Essential (primary) hypertension: Secondary | ICD-10-CM

## 2020-08-14 DIAGNOSIS — Z79899 Other long term (current) drug therapy: Secondary | ICD-10-CM | POA: Diagnosis not present

## 2020-08-14 DIAGNOSIS — N5201 Erectile dysfunction due to arterial insufficiency: Secondary | ICD-10-CM | POA: Diagnosis not present

## 2020-08-14 DIAGNOSIS — N401 Enlarged prostate with lower urinary tract symptoms: Secondary | ICD-10-CM | POA: Diagnosis not present

## 2020-08-21 ENCOUNTER — Other Ambulatory Visit: Payer: Self-pay | Admitting: Adult Health

## 2020-08-21 NOTE — Telephone Encounter (Signed)
Requested medication (s) are due for refill today: yes  Requested medication (s) are on the active medication list: yes  Last refill:  05/30/2020  Future visit scheduled: yes  Notes to clinic:  50,000 IU strengths are not delegated   Requested Prescriptions  Pending Prescriptions Disp Refills   Vitamin D, Ergocalciferol, (DRISDOL) 1.25 MG (50000 UNIT) CAPS capsule [Pharmacy Med Name: VITAMIN D2 50,000IU (ERGO) CAP RX] 12 capsule 0    Sig: TAKE 1 CAPSULE BY MOUTH EVERY 7 DAYS      Endocrinology:  Vitamins - Vitamin D Supplementation Failed - 08/21/2020  3:33 AM      Failed - 50,000 IU strengths are not delegated      Failed - Phosphate in normal range and within 360 days    No results found for: PHOS        Failed - Vitamin D in normal range and within 360 days    Vit D, 25-Hydroxy  Date Value Ref Range Status  02/19/2018 54.0 30.0 - 100.0 ng/mL Final    Comment:    Vitamin D deficiency has been defined by the Institute of Medicine and an Endocrine Society practice guideline as a level of serum 25-OH vitamin D less than 20 ng/mL (1,2). The Endocrine Society went on to further define vitamin D insufficiency as a level between 21 and 29 ng/mL (2). 1. IOM (Institute of Medicine). 2010. Dietary reference    intakes for calcium and D. [place]: The    [employer]. 2. Holick MF, Binkley Golden, Bischoff-Ferrari HA, et al.    Evaluation, treatment, and prevention of vitamin D    deficiency: an Endocrine Society clinical practice    guideline. JCEM. 2011 Jul; 96(7):1911-30.           Passed - Ca in normal range and within 360 days    Calcium  Date Value Ref Range Status  03/21/2020 9.4 8.6 - 10.2 mg/dL Final          Passed - Valid encounter within last 12 months    Recent Outpatient Visits           2 months ago Essential (primary) hypertension   [facility] Jerrol Banana., MD   4 months ago Knee pain, right anterior   [facility] Flinchum, Kelby Aline, FNP   5 months ago Annual physical exam   [facility] Jerrol Banana., MD   11 months ago Essential (primary) hypertension   [facility] Jerrol Banana., MD   11 months ago Arthralgia of right temporomandibular joint   [facility] Birdie Sons, MD       Future Appointments             In 4 months Jerrol Banana., MD [facility], [place]

## 2020-08-21 NOTE — Telephone Encounter (Signed)
No vit D level since 2019

## 2020-08-22 DIAGNOSIS — S96912A Strain of unspecified muscle and tendon at ankle and foot level, left foot, initial encounter: Secondary | ICD-10-CM | POA: Diagnosis not present

## 2020-09-11 ENCOUNTER — Other Ambulatory Visit: Payer: Self-pay | Admitting: Family Medicine

## 2020-09-11 DIAGNOSIS — I10 Essential (primary) hypertension: Secondary | ICD-10-CM

## 2020-09-20 DIAGNOSIS — M7662 Achilles tendinitis, left leg: Secondary | ICD-10-CM | POA: Diagnosis not present

## 2020-09-20 DIAGNOSIS — S96912A Strain of unspecified muscle and tendon at ankle and foot level, left foot, initial encounter: Secondary | ICD-10-CM | POA: Diagnosis not present

## 2020-10-18 DIAGNOSIS — S96912A Strain of unspecified muscle and tendon at ankle and foot level, left foot, initial encounter: Secondary | ICD-10-CM | POA: Diagnosis not present

## 2020-10-24 DIAGNOSIS — M7662 Achilles tendinitis, left leg: Secondary | ICD-10-CM | POA: Diagnosis not present

## 2020-10-24 DIAGNOSIS — I739 Peripheral vascular disease, unspecified: Secondary | ICD-10-CM | POA: Diagnosis not present

## 2020-11-11 ENCOUNTER — Other Ambulatory Visit: Payer: Self-pay | Admitting: Family Medicine

## 2020-11-11 NOTE — Telephone Encounter (Signed)
Requested medication (s) are due for refill today: yes  Requested medication (s) are on the active medication list: yes  Last refill:  08/21/20 #12  Future visit scheduled: yes  Notes to clinic:  med not delegated to NT to RF   Requested Prescriptions  Pending Prescriptions Disp Refills   Vitamin D, Ergocalciferol, (DRISDOL) 1.25 MG (50000 UNIT) CAPS capsule [Pharmacy Med Name: VITAMIN D2 50,000IU (ERGO) CAP RX] 12 capsule 0    Sig: TAKE 1 CAPSULE BY MOUTH EVERY 7 DAYS      Endocrinology:  Vitamins - Vitamin D Supplementation Failed - 11/11/2020  3:32 AM      Failed - 50,000 IU strengths are not delegated      Failed - Phosphate in normal range and within 360 days    No results found for: PHOS        Failed - Vitamin D in normal range and within 360 days    Vit D, 25-Hydroxy  Date Value Ref Range Status  02/19/2018 54.0 30.0 - 100.0 ng/mL Final    Comment:    Vitamin D deficiency has been defined by the Institute of Medicine and an Endocrine Society practice guideline as a level of serum 25-OH vitamin D less than 20 ng/mL (1,2). The Endocrine Society went on to further define vitamin D insufficiency as a level between 21 and 29 ng/mL (2). 1. IOM (Institute of Medicine). 2010. Dietary reference    intakes for calcium and D. [place]: The    [employer]. 2. Holick MF, Binkley Duval, Bischoff-Ferrari HA, et al.    Evaluation, treatment, and prevention of vitamin D    deficiency: an Endocrine Society clinical practice    guideline. JCEM. 2011 Jul; 96(7):1911-30.           Passed - Ca in normal range and within 360 days    Calcium  Date Value Ref Range Status  03/21/2020 9.4 8.6 - 10.2 mg/dL Final          Passed - Valid encounter within last 12 months    Recent Outpatient Visits           4 months ago Essential (primary) hypertension   [facility] Jerrol Banana., MD   6 months ago Knee pain, right anterior   [facility] Flinchum, Kelby Aline, FNP   7 months ago Annual physical exam   [facility] Jerrol Banana., MD   1 year ago Essential (primary) hypertension   [facility] Jerrol Banana., MD   1 year ago Arthralgia of right temporomandibular joint   [facility] Birdie Sons, MD       Future Appointments             In 1 month Jerrol Banana., MD [facility] [place]

## 2020-12-20 ENCOUNTER — Ambulatory Visit: Payer: Commercial Managed Care - PPO | Admitting: Family Medicine

## 2020-12-20 NOTE — Progress Notes (Deleted)
Established patient visit   Patient: Robert Patton   DOB: January 07, 1951   70 y.o. Male  MRN: [id number] Visit Date: 12/20/2020  Today's healthcare provider: [name], MD   No chief complaint on file.  Subjective    HPI  Hypertension, follow-up  BP Readings from Last 3 Encounters:  06/21/20 126/71  04/17/20 (!) 143/73  03/21/20 (!) 170/75   Wt Readings from Last 3 Encounters:  06/21/20 176 lb (79.8 kg)  04/17/20 178 lb 3.2 oz (80.8 kg)  03/21/20 178 lb (80.7 kg)     He was last seen for hypertension 6 months ago.  BP at that visit was 126/71. Management since that visit includes; Excellent control. He reports {excellent/good/fair/poor:19665} compliance with treatment. He {is/is not:9024} having side effects. {document side effects if present:1} He {is/is not:9024} exercising. He {is/is not:9024} adherent to low salt diet.   Outside blood pressures are {enter patient reported home BP, or 'not being checked':1}.  He {does/does not:200015} smoke.  Use of agents associated with hypertension: {bp agents assoc with hypertension:511}.   ---------------------------------------------------------------------------------------------------   {Show patient history (optional):23778}   Medications: Outpatient Medications Prior to Visit  Medication Sig   acetaminophen (TYLENOL) 500 MG tablet Take 1,000 mg by mouth every 6 (six) hours as needed (FOR PAIN.).   amLODipine (NORVASC) 5 MG tablet TAKE 1 TABLET(5 MG) BY MOUTH DAILY   aspirin EC 81 MG tablet Take 81 mg by mouth daily. (Patient not taking: Reported on 06/26/2020)   Calcium-Magnesium-Zinc (CAL-MAG-ZINC PO) Take 1 tablet by mouth daily.   carboxymethylcellulose 1 % ophthalmic solution Place 1-2 drops into both eyes 2 (two) times daily.   chlorthalidone (HYGROTON) 50 MG tablet TAKE 1 TABLET(50 MG) BY MOUTH EVERY MORNING   cyclobenzaprine (FLEXERIL) 5 MG tablet Take 1-2 tablets (5-10 mg total) by mouth every  evening.   losartan (COZAAR) 100 MG tablet TAKE 1 TABLET(100 MG) BY MOUTH DAILY   meloxicam (MOBIC) 15 MG tablet Take 15 mg by mouth daily.   Multiple Vitamin (MULTIVITAMIN WITH MINERALS) TABS tablet Take 1 tablet by mouth daily.   naproxen (NAPROSYN) 500 MG tablet Take 1 tablet (500 mg total) by mouth 2 (two) times daily. (Patient not taking: Reported on 06/26/2020)   predniSONE (STERAPRED UNI-PAK 21 TAB) 10 MG (21) TBPK tablet PO: Take 6 tablets on day 1:Take 5 tablets day 2:Take 4 tablets day 3: Take 3 tablets day 4:Take 2 tablets day five: 5 Take 1 tablet day 6 (Patient not taking: Reported on 06/26/2020)   tadalafil (CIALIS) 5 MG tablet Take 5 mg by mouth daily. (Patient not taking: Reported on 06/26/2020)   traMADol (ULTRAM) 50 MG tablet Take 50 mg by mouth every 6 (six) hours as needed.   Vitamin D, Ergocalciferol, (DRISDOL) 1.25 MG (50000 UNIT) CAPS capsule TAKE 1 CAPSULE BY MOUTH EVERY 7 DAYS   No facility-administered medications prior to visit.    Review of Systems  Constitutional:  Negative for appetite change, chills and fever.  Respiratory:  Negative for chest tightness, shortness of breath and wheezing.   Cardiovascular:  Negative for chest pain and palpitations.  Gastrointestinal:  Negative for abdominal pain, nausea and vomiting.   {Labs  Heme  Chem  Endocrine  Serology  Results Review (optional):23779}   Objective    There were no vitals taken for this visit. {Show previous vital signs (optional):23777}   Physical Exam  ***  No results found for any visits on 12/20/20.  Assessment &  Plan     ***  No follow-ups on file.      {provider attestation***:1}   [name], MD  [facility] [phone] (phone) [phone] (fax)  [place]

## 2020-12-25 ENCOUNTER — Telehealth: Payer: Self-pay

## 2020-12-25 NOTE — Telephone Encounter (Signed)
Copied from [place] [id number]. Topic: Appointment Scheduling - Scheduling Inquiry for Clinic >> Dec 25, 2020 10:18 AM Bayard Beaver wrote: Reason for [id number] wants call back  if earlier appt becomes available

## 2020-12-25 NOTE — Telephone Encounter (Signed)
Will see him as scheduled on 12-29-20.

## 2020-12-29 ENCOUNTER — Other Ambulatory Visit: Payer: Self-pay

## 2020-12-29 ENCOUNTER — Encounter: Payer: Self-pay | Admitting: Family Medicine

## 2020-12-29 ENCOUNTER — Ambulatory Visit (INDEPENDENT_AMBULATORY_CARE_PROVIDER_SITE_OTHER): Payer: Commercial Managed Care - PPO | Admitting: Family Medicine

## 2020-12-29 VITALS — BP 132/71 | HR 74 | Temp 98.1°F | Resp 16 | Wt 177.0 lb

## 2020-12-29 DIAGNOSIS — M25561 Pain in right knee: Secondary | ICD-10-CM | POA: Diagnosis not present

## 2020-12-29 DIAGNOSIS — I10 Essential (primary) hypertension: Secondary | ICD-10-CM | POA: Diagnosis not present

## 2020-12-29 DIAGNOSIS — E782 Mixed hyperlipidemia: Secondary | ICD-10-CM | POA: Diagnosis not present

## 2020-12-29 DIAGNOSIS — M7662 Achilles tendinitis, left leg: Secondary | ICD-10-CM | POA: Diagnosis not present

## 2020-12-29 DIAGNOSIS — I872 Venous insufficiency (chronic) (peripheral): Secondary | ICD-10-CM | POA: Diagnosis not present

## 2020-12-29 NOTE — Patient Instructions (Signed)
Chronic Venous Insufficiency Chronic venous insufficiency is a condition where the leg veins cannot effectively pump blood from the legs to the heart. This happens when the vein walls are either stretched, weakened, or damaged, or when the valves inside the vein are damaged. With the right treatment, you should be able to continue withan active life. This condition is also called venous stasis. What are the causes? Common causes of this condition include: High blood pressure inside the veins (venous hypertension). Sitting or standing too long, causing increased blood pressure in the leg veins. A blood clot that blocks blood flow in a vein (deep vein thrombosis, DVT). Inflammation of a vein (phlebitis) that causes a blood clot to form. Tumors in the pelvis that cause blood to back up. What increases the risk? The following factors may make you more likely to develop this condition: Having a family history of this condition. Obesity. Pregnancy. Living without enough regular physical activity or exercise (sedentary lifestyle). Smoking. Having a job that requires long periods of standing or sitting in one place. Being a certain age. Women in their 36s and 58s and men in their 16s are more likely to develop this condition. What are the signs or symptoms? Symptoms of this condition include: Veins that are enlarged, bulging, or twisted (varicose veins). Skin breakdown or ulcers. Reddened skin or dark discoloration of skin on the leg between the knee and ankle. Brown, smooth, tight, and painful skin just above the ankle, usually on the inside of the leg (lipodermatosclerosis). Swelling of the legs. How is this diagnosed? This condition may be diagnosed based on: Your medical history. A physical exam. Tests, such as: A procedure that creates an image of a blood vessel and nearby organs and provides information about blood flow through the blood vessel (duplex ultrasound). A procedure that tests  blood flow (plethysmography). A procedure that looks at the veins using X-ray and dye (venogram). How is this treated? The goals of treatment are to help you return to an active life and to minimize pain or disability. Treatment depends on the severity of your condition, and it may include: Wearing compression stockings. These can help relieve symptoms and help prevent your condition from getting worse. However, they do not cure the condition. Sclerotherapy. This procedure involves an injection of a solution that shrinks damaged veins. Surgery. This may involve: Removing a diseased vein (vein stripping). Cutting off blood flow through the vein (laser ablation surgery). Repairing or reconstructing a valve within the affected vein. Follow these instructions at home:     Wear compression stockings as told by your health care provider. These stockings help to prevent blood clots and reduce swelling in your legs. Take over-the-counter and prescription medicines only as told by your health care provider. Stay active by exercising, walking, or doing different activities. Ask your health care provider what activities are safe for you and how much exercise you need. Drink enough fluid to keep your urine pale yellow. Do not use any products that contain nicotine or tobacco, such as cigarettes, e-cigarettes, and chewing tobacco. If you need help quitting, ask your health care provider. Keep all follow-up visits as told by your health care provider. This is important. Contact a health care provider if you: Have redness, swelling, or more pain in the affected area. See a red streak or line that goes up or down from the affected area. Have skin breakdown or skin loss in the affected area, even if the breakdown is small. Get an  injury in the affected area. Get help right away if: You get an injury and an open wound in the affected area. You have: Severe pain that does not get better with  medicine. Sudden numbness or weakness in the foot or ankle below the affected area. Trouble moving your foot or ankle. A fever. Worse or persistent symptoms. Chest pain. Shortness of breath. Summary Chronic venous insufficiency is a condition where the leg veins cannot effectively pump blood from the legs to the heart. Chronic venous insufficiency occurs when the vein walls become stretched, weakened, or damaged, or when valves within the vein are damaged. Treatment depends on how severe your condition is. It often involves wearing compression stockings and may involve having a procedure. Make sure you stay active by exercising, walking, or doing different activities. Ask your health care provider what activities are safe for you and how much exercise you need. This information is not intended to replace advice given to you by your health care provider. Make sure you discuss any questions you have with your healthcare provider. Document Revised: 02/03/2018 Document Reviewed: 02/03/2018 Elsevier Patient Education  [place].

## 2020-12-29 NOTE — Progress Notes (Signed)
Established patient visit   Patient: Robert Patton   DOB: 28-Oct-1950   70 y.o. Male  MRN: [id number] Visit Date: 12/29/2020  Today's healthcare provider: Vernie Murders, PA-C   Chief Complaint  Patient presents with   Leg Swelling   Subjective    HPI  Leg Swelling  Patient presents today c/o swelling in lower extremities. He reports that it has worsened in the last few days. He feels that it could be related to one of his BP medications. He denies any pain. He denies redness. He says that the swelling is worse when sitting for long periods of time.   BP Readings from Last 3 Encounters:  12/29/20 132/71  06/21/20 126/71  04/17/20 (!) 143/73   Wt Readings from Last 3 Encounters:  12/29/20 177 lb (80.3 kg)  06/21/20 176 lb (79.8 kg)  04/17/20 178 lb 3.2 oz (80.8 kg)     Past Medical History:  Diagnosis Date   ED (erectile dysfunction)    Hyperlipidemia    Hypertension    Past Surgical History:  Procedure Laterality Date   COLONOSCOPY  08/15/2009   COLONOSCOPY WITH PROPOFOL N/A 10/08/2019   Procedure: COLONOSCOPY WITH PROPOFOL;  Surgeon: Lin Landsman, MD;  Location: [facility];  Service: Gastroenterology;  Laterality: N/A;   EYE SURGERY Right 2016   CATARACT EXTRACTION   INGUINAL HERNIA REPAIR Right 07/22/2017   Procedure: HERNIA REPAIR INGUINAL ADULT;  Surgeon: Royston Cowper, MD;  Location: [facility];  Service: Urology;  Laterality: Right;   NO PAST SURGERIES     Social History   Tobacco Use   Smoking status: Never   Smokeless tobacco: Never  Vaping Use   Vaping Use: Never used  Substance Use Topics   Alcohol use: No   Drug use: No   Family Status  Relation Name Status   Mother  Deceased at age 36   Father  Deceased at age 48   Sister  31   Brother  Alive   Sister  Alive   Sister  Alive   Brother  Alive   Allergies  Allergen Reactions   Amlodipine Besylate     Insomnia   Diltiazem     Had issues sexually        Medications: Outpatient Medications Prior to Visit  Medication Sig   acetaminophen (TYLENOL) 500 MG tablet Take 1,000 mg by mouth every 6 (six) hours as needed (FOR PAIN.).   amLODipine (NORVASC) 5 MG tablet TAKE 1 TABLET(5 MG) BY MOUTH DAILY   Calcium-Magnesium-Zinc (CAL-MAG-ZINC PO) Take 1 tablet by mouth daily.   carboxymethylcellulose 1 % ophthalmic solution Place 1-2 drops into both eyes 2 (two) times daily.   chlorthalidone (HYGROTON) 50 MG tablet TAKE 1 TABLET(50 MG) BY MOUTH EVERY MORNING   cyclobenzaprine (FLEXERIL) 5 MG tablet Take 1-2 tablets (5-10 mg total) by mouth every evening.   losartan (COZAAR) 100 MG tablet TAKE 1 TABLET(100 MG) BY MOUTH DAILY   meloxicam (MOBIC) 15 MG tablet Take 15 mg by mouth daily.   Multiple Vitamin (MULTIVITAMIN WITH MINERALS) TABS tablet Take 1 tablet by mouth daily.   aspirin EC 81 MG tablet Take 81 mg by mouth daily. (Patient not taking: No sig reported)   naproxen (NAPROSYN) 500 MG tablet Take 1 tablet (500 mg total) by mouth 2 (two) times daily. (Patient not taking: Reported on 06/26/2020)   predniSONE (STERAPRED UNI-PAK 21 TAB) 10 MG (21) TBPK tablet PO: Take 6 tablets on day  1:Take 5 tablets day 2:Take 4 tablets day 3: Take 3 tablets day 4:Take 2 tablets day five: 5 Take 1 tablet day 6 (Patient not taking: Reported on 06/26/2020)   tadalafil (CIALIS) 5 MG tablet Take 5 mg by mouth daily. (Patient not taking: Reported on 06/26/2020)   traMADol (ULTRAM) 50 MG tablet Take 50 mg by mouth every 6 (six) hours as needed.   Vitamin D, Ergocalciferol, (DRISDOL) 1.25 MG (50000 UNIT) CAPS capsule TAKE 1 CAPSULE BY MOUTH EVERY 7 DAYS   No facility-administered medications prior to visit.    Review of Systems  Constitutional:  Negative for activity change and fever.  Cardiovascular:  Positive for leg swelling. Negative for chest pain and palpitations.  Musculoskeletal:  Negative for arthralgias, joint swelling and myalgias.  Skin:  Negative for color  change, pallor and wound.  Neurological:  Negative for light-headedness and headaches.       Objective    BP 132/71   Pulse 74   Temp 98.1 F (36.7 C)   Resp 16   Wt 177 lb (80.3 kg)   BMI 25.40 kg/m  BP Readings from Last 3 Encounters:  01/24/21 (!) 145/81  12/29/20 132/71  06/21/20 126/71   Wt Readings from Last 3 Encounters:  01/24/21 174 lb (78.9 kg)  12/29/20 177 lb (80.3 kg)  06/21/20 176 lb (79.8 kg)       Physical Exam    No results found for any visits on 12/29/20.  Assessment & Plan     1. Venous insufficiency, peripheral  - CBC with Differential/Platelet - Comprehensive metabolic panel - TSH  2. Essential (primary) hypertension  - CBC with Differential/Platelet - Comprehensive metabolic panel - Lipid panel - TSH  3. Achilles tendinitis of left lower extremity   4. Mixed hyperlipidemia  - Comprehensive metabolic panel - Lipid panel - TSH  5. Knee pain, right anterior     No follow-ups on file.      Am the supervising physician for [name], PA and I am signing off on this unfinished note with no further clinical information [name]., MD    Vernie Murders, PA-C  [facility] [phone] (phone) [phone] (fax)  [place]

## 2020-12-30 LAB — CBC WITH DIFFERENTIAL/PLATELET
Basophils Absolute: 0 10*3/uL (ref 0.0–0.2)
Basos: 0 %
EOS (ABSOLUTE): 0.2 10*3/uL (ref 0.0–0.4)
Eos: 3 %
Hematocrit: 38.6 % (ref 37.5–51.0)
Hemoglobin: 13.3 g/dL (ref 13.0–17.7)
Immature Grans (Abs): 0 10*3/uL (ref 0.0–0.1)
Immature Granulocytes: 0 %
Lymphocytes Absolute: 2.2 10*3/uL (ref 0.7–3.1)
Lymphs: 42 %
MCH: 28.4 pg (ref 26.6–33.0)
MCHC: 34.5 g/dL (ref 31.5–35.7)
MCV: 83 fL (ref 79–97)
Monocytes Absolute: 0.5 10*3/uL (ref 0.1–0.9)
Monocytes: 10 %
Neutrophils Absolute: 2.3 10*3/uL (ref 1.4–7.0)
Neutrophils: 45 %
Platelets: 190 10*3/uL (ref 150–450)
RBC: 4.68 x10E6/uL (ref 4.14–5.80)
RDW: 13.1 % (ref 11.6–15.4)
WBC: 5.2 10*3/uL (ref 3.4–10.8)

## 2020-12-30 LAB — COMPREHENSIVE METABOLIC PANEL
ALT: 19 IU/L (ref 0–44)
AST: 25 IU/L (ref 0–40)
Albumin/Globulin Ratio: 1.6 (ref 1.2–2.2)
Albumin: 4.1 g/dL (ref 3.7–4.7)
Alkaline Phosphatase: 97 IU/L (ref 44–121)
BUN/Creatinine Ratio: 23 (ref 10–24)
BUN: 25 mg/dL (ref 8–27)
Bilirubin Total: 0.3 mg/dL (ref 0.0–1.2)
CO2: 25 mmol/L (ref 20–29)
Calcium: 8.9 mg/dL (ref 8.6–10.2)
Chloride: 103 mmol/L (ref 96–106)
Creatinine, Ser: 1.09 mg/dL (ref 0.76–1.27)
Globulin, Total: 2.5 g/dL (ref 1.5–4.5)
Glucose: 99 mg/dL (ref 65–99)
Potassium: 3.8 mmol/L (ref 3.5–5.2)
Sodium: 141 mmol/L (ref 134–144)
Total Protein: 6.6 g/dL (ref 6.0–8.5)
eGFR: 73 mL/min/{1.73_m2} (ref >59)

## 2020-12-30 LAB — LIPID PANEL
Chol/HDL Ratio: 2.5 ratio (ref 0.0–5.0)
Cholesterol, Total: 184 mg/dL (ref 100–199)
HDL: 75 mg/dL (ref >39)
LDL Chol Calc (NIH): 99 mg/dL (ref 0–99)
Triglycerides: 51 mg/dL (ref 0–149)
VLDL Cholesterol Cal: 10 mg/dL (ref 5–40)

## 2020-12-30 LAB — TSH: TSH: 1.25 u[IU]/mL (ref 0.450–4.500)

## 2021-01-09 ENCOUNTER — Telehealth: Payer: Self-pay

## 2021-01-09 NOTE — Telephone Encounter (Signed)
Copied from [place] [id number]. Topic: General - Inquiry >> Jan 09, 2021  8:34 AM Oneta Rack wrote: Patient requesting PCP fill out a health exam form reflecting he had a physical in 2022. Patient has appointment on  03/06/2021 but would like PCP to work him in sooner then 02/23/2021 which is the deadline. Patient last physical was 03/21/2020. Patient will drop form off

## 2021-01-09 NOTE — Telephone Encounter (Signed)
No available CPE before 02/23/2021. Patient was advised of this 2 weeks ago.  I had to work him into the 03/06/2021 appt because he requested a morning appt. Are you willing to fill out the form before he comes in Oct? Please advise. Thanks!

## 2021-01-17 DIAGNOSIS — M1711 Unilateral primary osteoarthritis, right knee: Secondary | ICD-10-CM | POA: Diagnosis not present

## 2021-01-17 DIAGNOSIS — M7662 Achilles tendinitis, left leg: Secondary | ICD-10-CM | POA: Diagnosis not present

## 2021-01-24 ENCOUNTER — Other Ambulatory Visit: Payer: Self-pay

## 2021-01-24 ENCOUNTER — Ambulatory Visit (INDEPENDENT_AMBULATORY_CARE_PROVIDER_SITE_OTHER): Payer: Commercial Managed Care - PPO | Admitting: Family Medicine

## 2021-01-24 ENCOUNTER — Encounter: Payer: Self-pay | Admitting: Family Medicine

## 2021-01-24 VITALS — BP 145/81 | HR 58 | Temp 98.3°F | Resp 16 | Ht 70.0 in | Wt 174.0 lb

## 2021-01-24 DIAGNOSIS — Z0000 Encounter for general adult medical examination without abnormal findings: Secondary | ICD-10-CM

## 2021-01-24 DIAGNOSIS — I10 Essential (primary) hypertension: Secondary | ICD-10-CM | POA: Diagnosis not present

## 2021-01-24 DIAGNOSIS — R972 Elevated prostate specific antigen [PSA]: Secondary | ICD-10-CM

## 2021-01-24 LAB — POCT URINALYSIS DIPSTICK
Bilirubin, UA: NEGATIVE
Blood, UA: NEGATIVE
Glucose, UA: NEGATIVE
Ketones, UA: NEGATIVE
Leukocytes, UA: NEGATIVE
Nitrite, UA: NEGATIVE
Protein, UA: NEGATIVE
Spec Grav, UA: 1.02 (ref 1.010–1.025)
Urobilinogen, UA: 1 E.U./dL
pH, UA: 7.5 (ref 5.0–8.0)

## 2021-01-24 NOTE — Progress Notes (Signed)
I,Robert Patton,acting as a scribe for Robert Durie, MD.,have documented all relevant documentation on the behalf of Robert Durie, MD,as directed by  Robert Durie, MD while in the presence of Robert Durie, MD.   Complete physical exam   Patient: Robert Patton   DOB: Aug 24, 1950   70 y.o. Male  MRN: [id number] Visit Date: 01/24/2021  Today's healthcare provider: [name], MD   Chief Complaint  Patient presents with   Annual Exam   Subjective    Patton Robert is a 70 y.o. male who presents today for a complete physical exam.  He reports consuming a general diet. Home exercise routine includes exercise bike and weights, walking. He generally feels well. He reports sleeping fairly well. He does not have additional problems to discuss today.  He evidently needed the physical because of work. He has left Achilles pain per Dr. Sabra Heck.  He also has discomfort on the bottom of his both feet while walking at work.  Does not bother him otherwise. HPI    Past Medical History:  Diagnosis Date   ED (erectile dysfunction)    Hyperlipidemia    Hypertension    Past Surgical History:  Procedure Laterality Date   COLONOSCOPY  08/15/2009   COLONOSCOPY WITH PROPOFOL N/A 10/08/2019   Procedure: COLONOSCOPY WITH PROPOFOL;  Surgeon: Lin Landsman, MD;  Location: [facility] ENDOSCOPY;  Service: Gastroenterology;  Laterality: N/A;   EYE SURGERY Right 2016   CATARACT EXTRACTION   INGUINAL HERNIA REPAIR Right 07/22/2017   Procedure: HERNIA REPAIR INGUINAL ADULT;  Surgeon: Royston Cowper, MD;  Location: [facility] ORS;  Service: Urology;  Laterality: Right;   NO PAST SURGERIES     Social History   Socioeconomic History   Marital status: Married    Spouse name: Not on file   Number of children: 3   Years of education: Not on file   Highest education level: Bachelor's degree (e.g., BA, AB, BS)  Occupational History   Occupation: CNA  Tobacco Use   Smoking status:  Never   Smokeless tobacco: Never  Vaping Use   Vaping Use: Never used  Substance and Sexual Activity   Alcohol use: No   Drug use: No   Sexual activity: Not on file  Other Topics Concern   Not on file  Social History Narrative   Not on file   Social Determinants of Health   Financial Resource Strain: Low Risk    Difficulty of Paying Living Expenses: Not hard at all  Food Insecurity: No Food Insecurity   Worried About Charity fundraiser in the Last Year: Never true   [place] in the Last Year: Never true  Transportation Needs: No Transportation Needs   Lack of Transportation (Medical): No   Lack of Transportation (Non-Medical): No  Physical Activity: Insufficiently Active   Days of Exercise per Week: 7 days   Minutes of Exercise per Session: 20 min  Stress: No Stress Concern Present   Feeling of Stress : Not at all  Social Connections: Socially Integrated   Frequency of Communication with Friends and Family: More than three times a week   Frequency of Social Gatherings with Friends and Family: More than three times a week   Attends Religious Services: More than 4 times per year   Active Member of [employer] or Organizations: Yes   Attends Archivist Meetings: More than 4 times per year   Marital Status: Married  Intimate Partner Violence: Not At Risk   Fear of Current or Ex-Partner: No   Emotionally Abused: No   Physically Abused: No   Sexually Abused: No   Family Status  Relation Name Status   Mother  Deceased at age 47   Father  Deceased at age 52   Sister  35   Brother  Alive   Sister  Alive   Sister  Robert Patton   Family History  Problem Relation Age of Onset   Hypertension Brother    Allergies  Allergen Reactions   Amlodipine Besylate     Insomnia   Diltiazem     Had issues sexually    Patient Care Team: Jerrol Banana., MD as PCP - General (Family Medicine) Marius Ditch, Tally Due, MD as Consulting Physician  (Gastroenterology) Royston Cowper, MD as Consulting Physician (Urology)   Medications: Outpatient Medications Prior to Visit  Medication Sig   acetaminophen (TYLENOL) 500 MG tablet Take 1,000 mg by mouth every 6 (six) hours as needed (FOR PAIN.).   amLODipine (NORVASC) 5 MG tablet TAKE 1 TABLET(5 MG) BY MOUTH DAILY   Calcium-Magnesium-Zinc (CAL-MAG-ZINC PO) Take 1 tablet by mouth daily.   carboxymethylcellulose 1 % ophthalmic solution Place 1-2 drops into both eyes 2 (two) times daily.   chlorthalidone (HYGROTON) 50 MG tablet TAKE 1 TABLET(50 MG) BY MOUTH EVERY MORNING   cyclobenzaprine (FLEXERIL) 5 MG tablet Take 1-2 tablets (5-10 mg total) by mouth every evening.   losartan (COZAAR) 100 MG tablet TAKE 1 TABLET(100 MG) BY MOUTH DAILY   meloxicam (MOBIC) 15 MG tablet Take 15 mg by mouth daily.   Multiple Vitamin (MULTIVITAMIN WITH MINERALS) TABS tablet Take 1 tablet by mouth daily.   naproxen (NAPROSYN) 500 MG tablet Take 1 tablet (500 mg total) by mouth 2 (two) times daily.   tadalafil (CIALIS) 5 MG tablet Take 5 mg by mouth daily.   traMADol (ULTRAM) 50 MG tablet Take 50 mg by mouth every 6 (six) hours as needed.   Vitamin D, Ergocalciferol, (DRISDOL) 1.25 MG (50000 UNIT) CAPS capsule TAKE 1 CAPSULE BY MOUTH EVERY 7 DAYS   [DISCONTINUED] aspirin EC 81 MG tablet Take 81 mg by mouth daily. (Patient not taking: No sig reported)   [DISCONTINUED] predniSONE (STERAPRED UNI-PAK 21 TAB) 10 MG (21) TBPK tablet PO: Take 6 tablets on day 1:Take 5 tablets day 2:Take 4 tablets day 3: Take 3 tablets day 4:Take 2 tablets day five: 5 Take 1 tablet day 6 (Patient not taking: Reported on 01/24/2021)   No facility-administered medications prior to visit.    Review of Systems  All other systems reviewed and are negative.     Objective    BP (!) 145/81 (BP Location: Right Arm, Patient Position: Sitting, Cuff Size: Large)   Pulse (!) 58   Temp 98.3 F (36.8 C) (Oral)   Resp 16   Ht '5\' 10"'$  (1.778 m)    Wt 174 lb (78.9 kg)   SpO2 97%   BMI 24.97 kg/m     Physical Exam Vitals reviewed.  Constitutional:      Appearance: He is well-developed.  HENT:     Head: Normocephalic and atraumatic.  Eyes:     General: No scleral icterus.    Conjunctiva/sclera: Conjunctivae normal.  Neck:     Thyroid: No thyromegaly.  Cardiovascular:     Rate and Rhythm: Normal rate and regular rhythm.     Heart sounds: Normal heart sounds.  Pulmonary:     Effort: Pulmonary effort is normal.     Breath sounds: Normal breath sounds.  Abdominal:     Palpations: Abdomen is soft.  Musculoskeletal:     Cervical back: Normal range of motion and neck supple.     Comments: Mild tenderness of the distal Achilles but no tenderness of the bottom of his feet.  Lymphadenopathy:     Cervical: No cervical adenopathy.  Skin:    General: Skin is warm and dry.  Neurological:     General: No focal deficit present.     Mental Status: He is alert and oriented to person, place, and time.  Psychiatric:        Mood and Affect: Mood normal.        Behavior: Behavior normal.        Thought Content: Thought content normal.        Judgment: Judgment normal.      Last depression screening scores PHQ 2/9 Scores 01/24/2021 06/26/2020 06/21/2020  PHQ - 2 Score 0 0 0  PHQ- 9 Score 0 - 0   Last fall risk screening Fall Risk  01/24/2021  Falls in the past year? 0  Number falls in past yr: 0  Injury with Fall? 0  Risk for fall due to : No Fall Risks  Follow up Falls evaluation completed   Last Audit-C alcohol use screening Alcohol Use Disorder Test (AUDIT) 01/24/2021  1. How often do you have a drink containing alcohol? 0  2. How many drinks containing alcohol do you have on a typical day when you are drinking? 0  3. How often do you have six or more drinks on one occasion? 0  AUDIT-C Score 0  Alcohol Brief Interventions/Follow-up -   A score of 3 or more in women, and 4 or more in men indicates increased risk for  alcohol abuse, EXCEPT if all of the points are from question 1   Results for orders placed or performed in visit on 01/24/21  POCT urinalysis dipstick  Result Value Ref Range   Color, UA Yellow    Clarity, UA Clear    Glucose, UA Negative Negative   Bilirubin, UA Negative    Ketones, UA Negative    Spec Grav, UA 1.020 1.010 - 1.025   Blood, UA Negative    pH, UA 7.5 5.0 - 8.0   Protein, UA Negative Negative   Urobilinogen, UA 1.0 0.2 or 1.0 E.U./dL   Nitrite, UA Negative    Leukocytes, UA Negative Negative    Assessment & Plan    Routine Health Maintenance and Physical Exam  Exercise Activities and Dietary recommendations  Goals   None     Immunization History  Administered Date(s) Administered   Fluad Quad(high Dose 65+) 03/11/2019, 03/14/2020   Influenza Split 04/08/2011   Influenza, High Dose Seasonal PF 02/19/2018   Moderna Sars-Covid-2 Vaccination 07/05/2019, 08/02/2019   Pneumococcal Conjugate-13 07/20/2014   Pneumococcal Polysaccharide-23 02/19/2018   Tdap 04/08/2011   Zoster, Live 12/24/2011    Health Maintenance  Topic Date Due   Zoster Vaccines- Shingrix (1 of 2) Never done   COVID-19 Vaccine (3 - Booster for Moderna series) 01/02/2020   INFLUENZA VACCINE  12/25/2020   TETANUS/TDAP  04/07/2021   COLONOSCOPY (Pts 45-60yr Insurance coverage will need to be confirmed)  10/07/2029   Hepatitis C Screening  Completed   PNA vac Low Risk Adult  Completed   HPV VACCINES  Aged Out    Discussed  health benefits of physical activity, and encouraged him to engage in regular exercise appropriate for his age and condition.  1. Annual physical exam   2. Elevated PSA  - PSA  3. Essential (primary) hypertension  - POCT urinalysis dipstick--Negative   Return in about 6 months (around 07/24/2021).     I, Robert Durie, MD, have reviewed all documentation for this visit. The documentation on 01/28/21 for the exam, diagnosis, procedures, and orders are all  accurate and complete.    Nimco Bivens Cranford Mon, MD  [facility] [phone] (phone) [phone] (fax)  [place]

## 2021-01-24 NOTE — Patient Instructions (Addendum)
[facility]. Try over-the-counter Alpha Lipoic Acid daily.

## 2021-01-26 LAB — PSA: Prostate Specific Ag, Serum: 5.8 ng/mL — ABNORMAL HIGH (ref 0.0–4.0)

## 2021-02-07 ENCOUNTER — Telehealth: Payer: Self-pay

## 2021-02-07 NOTE — Telephone Encounter (Signed)
Pt. Given PSA results. Requests a copy be mailed to his home.

## 2021-02-07 NOTE — Telephone Encounter (Signed)
Done

## 2021-03-06 ENCOUNTER — Ambulatory Visit: Payer: Self-pay | Admitting: Family Medicine

## 2021-03-07 ENCOUNTER — Other Ambulatory Visit: Payer: Self-pay | Admitting: Family Medicine

## 2021-03-07 DIAGNOSIS — I10 Essential (primary) hypertension: Secondary | ICD-10-CM

## 2021-03-07 NOTE — Telephone Encounter (Signed)
Requested Prescriptions  Pending Prescriptions Disp Refills  . amLODipine (NORVASC) 5 MG tablet [Pharmacy Med Name: AMLODIPINE BESYLATE 5MG  TABLETS] 90 tablet 1    Sig: TAKE 1 TABLET(5 MG) BY MOUTH DAILY     Cardiovascular:  Calcium Channel Blockers Failed - 03/07/2021  3:33 AM      Failed - Last BP in normal range    BP Readings from Last 1 Encounters:  01/24/21 (!) 145/81         Passed - Valid encounter within last 6 months    Recent Outpatient Visits          1 month ago Annual physical exam   [facility] Jerrol Banana., MD   2 months ago Venous insufficiency, peripheral   [place], Vickki Muff, PA-C   8 months ago Essential (primary) hypertension   [facility] Jerrol Banana., MD   10 months ago Knee pain, right anterior   [facility] Flinchum, Kelby Aline, FNP   11 months ago Annual physical exam   [facility] Jerrol Banana., MD      Future Appointments            In 4 months Jerrol Banana., MD [facility], [place]

## 2021-03-11 ENCOUNTER — Other Ambulatory Visit: Payer: Self-pay | Admitting: Family Medicine

## 2021-03-11 DIAGNOSIS — I10 Essential (primary) hypertension: Secondary | ICD-10-CM

## 2021-03-11 NOTE — Telephone Encounter (Signed)
Requested Prescriptions  Pending Prescriptions Disp Refills  . losartan (COZAAR) 100 MG tablet [Pharmacy Med Name: LOSARTAN 100MG  TABLETS] 90 tablet 1    Sig: TAKE 1 TABLET(100 MG) BY MOUTH DAILY     Cardiovascular:  Angiotensin Receptor Blockers Failed - 03/11/2021 11:49 AM      Failed - Last BP in normal range    BP Readings from Last 1 Encounters:  01/24/21 (!) 145/81         Passed - Cr in normal range and within 180 days    Creat  Date Value Ref Range Status  02/04/2017 0.88 0.70 - 1.25 mg/dL Final    Comment:    For patients >45 years of age, the reference limit for Creatinine is approximately 13% higher for people identified as African-American. .    Creatinine, Ser  Date Value Ref Range Status  12/29/2020 1.09 0.76 - 1.27 mg/dL Final         Passed - K in normal range and within 180 days    Potassium  Date Value Ref Range Status  12/29/2020 3.8 3.5 - 5.2 mmol/L Final         Passed - Patient is not pregnant      Passed - Valid encounter within last 6 months    Recent Outpatient Visits          1 month ago Annual physical exam   [facility] Jerrol Banana., MD   2 months ago Venous insufficiency, peripheral   [place], Vickki Muff, PA-C   8 months ago Essential (primary) hypertension   [facility] Jerrol Banana., MD   10 months ago Knee pain, right anterior   [facility] Flinchum, Kelby Aline, FNP   11 months ago Annual physical exam   [facility] Jerrol Banana., MD      Future Appointments            In 4 months Jerrol Banana., MD [facility], [place]

## 2021-04-14 ENCOUNTER — Other Ambulatory Visit: Payer: Self-pay | Admitting: Family Medicine

## 2021-04-14 DIAGNOSIS — I10 Essential (primary) hypertension: Secondary | ICD-10-CM

## 2021-04-14 NOTE — Telephone Encounter (Signed)
Requested Prescriptions  Pending Prescriptions Disp Refills  . losartan (COZAAR) 100 MG tablet [Pharmacy Med Name: LOSARTAN 100MG  TABLETS] 90 tablet 0    Sig: TAKE 1 TABLET(100 MG) BY MOUTH DAILY     Cardiovascular:  Angiotensin Receptor Blockers Failed - 04/14/2021  5:29 PM      Failed - Last BP in normal range    BP Readings from Last 1 Encounters:  01/24/21 (!) 145/81         Passed - Cr in normal range and within 180 days    Creat  Date Value Ref Range Status  02/04/2017 0.88 0.70 - 1.25 mg/dL Final    Comment:    For patients >27 years of age, the reference limit for Creatinine is approximately 13% higher for people identified as African-American. .    Creatinine, Ser  Date Value Ref Range Status  12/29/2020 1.09 0.76 - 1.27 mg/dL Final         Passed - K in normal range and within 180 days    Potassium  Date Value Ref Range Status  12/29/2020 3.8 3.5 - 5.2 mmol/L Final         Passed - Patient is not pregnant      Passed - Valid encounter within last 6 months    Recent Outpatient Visits          2 months ago Annual physical exam   [facility] Jerrol Banana., MD   3 months ago Venous insufficiency, peripheral   [facility] [place], Vickki Muff, PA-C   9 months ago Essential (primary) hypertension   [place] Jerrol Banana., MD   12 months ago Knee pain, right anterior   [facility] Flinchum, Kelby Aline, FNP   1 year ago Annual physical exam   [facility] Jerrol Banana., MD      Future Appointments            In 3 months Jerrol Banana., MD [facility], [place]

## 2021-05-09 ENCOUNTER — Ambulatory Visit: Payer: Self-pay

## 2021-05-09 NOTE — Telephone Encounter (Signed)
°  Chief Complaint: Numbness L foot Symptoms: numbness in L foot in the back  Frequency: 1 week Pertinent Negatives: Patient denies pain or swelling Disposition: [] ED /[] Urgent Care (no appt availability in office) / [x] Appointment(In office/virtual)/ []  [place] [facility]/ [] Home Care/ [] Refused Recommended Disposition  Additional Notes: pt states it is hard to put a shoe on and walk on that foot. Appt scheduled for first available tomorrow at [place] with [place], [place].     Reason for Disposition  [1] Numbness (i.e., loss of sensation) of the face, arm / hand, or leg / foot on one side of the body AND [2] gradual onset (e.g., days to weeks) AND [3] present now  Answer Assessment - Initial Assessment Questions 1. SYMPTOM: "What is the main symptom you are concerned about?" (e.g., weakness, numbness)     Numbness in L foot 2. ONSET: "When did this start?" (minutes, hours, days; while sleeping)     1 week 3. LAST NORMAL: "When was the last time you (the patient) were normal (no symptoms)?"     Over a week ago 4. PATTERN "Does this come and go, or has it been constant since it started?"  "Is it present now?"     constant 6. NEUROLOGIC SYMPTOMS: "Have you had any of the following symptoms: headache, dizziness, vision loss, double vision, changes in speech, unsteady on your feet?"     7. OTHER SYMPTOMS: "Do you have any other symptoms?"     Hard to walk to on L foot  Protocols used: Neurologic Deficit-A-AH

## 2021-05-09 NOTE — Progress Notes (Signed)
Established patient visit   Patient: Robert Patton   DOB: 02-22-1951   70 y.o. Male  MRN: [id number] Visit Date: 05/10/2021  Today's healthcare provider: Mikey Kirschner, PA-C   Chief Complaint  Patient presents with   Numbness   Subjective    HPI  Robert Patton is a 70 y/o male who presents today for evaluation of continued L ankle pain and now left foot numbness x 2 weeks. He states that he started using a hard insert in his shoes that he saw online, and this is only when the numbness started. Also occasionally wears compressive socks, but this only brings more pain. Reports ice improves symptoms. He asks for a letter today describing how the hard insert does not work, so he can send to the company to get his money back.  Robert Patton has a history of left achilles tendinopathy, reports a history of physical therapy. He has taken Mobic and Alleve without relief.  Reports he was recently given celebrex, but unsure if it working. He is wary of medications and their side effects. Reports he has a CAM boot at home but does not like to wear it.  Medications: Outpatient Medications Prior to Visit  Medication Sig   acetaminophen (TYLENOL) 500 MG tablet Take 1,000 mg by mouth every 6 (six) hours as needed (FOR PAIN.).   amLODipine (NORVASC) 5 MG tablet TAKE 1 TABLET(5 MG) BY MOUTH DAILY   Calcium-Magnesium-Zinc (CAL-MAG-ZINC PO) Take 1 tablet by mouth daily.   carboxymethylcellulose 1 % ophthalmic solution Place 1-2 drops into both eyes 2 (two) times daily.   chlorthalidone (HYGROTON) 50 MG tablet TAKE 1 TABLET(50 MG) BY MOUTH EVERY MORNING   cyclobenzaprine (FLEXERIL) 5 MG tablet Take 1-2 tablets (5-10 mg total) by mouth every evening.   losartan (COZAAR) 100 MG tablet TAKE 1 TABLET(100 MG) BY MOUTH DAILY   Multiple Vitamin (MULTIVITAMIN WITH MINERALS) TABS tablet Take 1 tablet by mouth daily.   tadalafil (CIALIS) 5 MG tablet Take 5 mg by mouth daily.   traMADol (ULTRAM) 50 MG tablet Take 50 mg by  mouth every 6 (six) hours as needed.   Vitamin D, Ergocalciferol, (DRISDOL) 1.25 MG (50000 UNIT) CAPS capsule TAKE 1 CAPSULE BY MOUTH EVERY 7 DAYS   [DISCONTINUED] meloxicam (MOBIC) 15 MG tablet Take 15 mg by mouth daily. (Patient not taking: Reported on 05/10/2021)   [DISCONTINUED] naproxen (NAPROSYN) 500 MG tablet Take 1 tablet (500 mg total) by mouth 2 (two) times daily. (Patient not taking: Reported on 05/10/2021)   No facility-administered medications prior to visit.    Review of Systems  Musculoskeletal:  Positive for arthralgias and joint swelling.  Neurological:  Positive for numbness.   Last CBC Lab Results  Component Value Date   WBC 5.2 12/29/2020   HGB 13.3 12/29/2020   HCT 38.6 12/29/2020   MCV 83 12/29/2020   MCH 28.4 12/29/2020   RDW 13.1 12/29/2020   PLT 190 55/97/4163   Last metabolic panel Lab Results  Component Value Date   GLUCOSE 99 12/29/2020   NA 141 12/29/2020   K 3.8 12/29/2020   CL 103 12/29/2020   CO2 25 12/29/2020   BUN 25 12/29/2020   CREATININE 1.09 12/29/2020   EGFR 73 12/29/2020   CALCIUM 8.9 12/29/2020   PROT 6.6 12/29/2020   ALBUMIN 4.1 12/29/2020   LABGLOB 2.5 12/29/2020   AGRATIO 1.6 12/29/2020   BILITOT 0.3 12/29/2020   ALKPHOS 97 12/29/2020   AST 25 12/29/2020  ALT 19 12/29/2020   ANIONGAP 7 07/21/2017   Last lipids Lab Results  Component Value Date   CHOL 184 12/29/2020   HDL 75 12/29/2020   LDLCALC 99 12/29/2020   TRIG 51 12/29/2020   CHOLHDL 2.5 12/29/2020   Last hemoglobin A1c Lab Results  Component Value Date   HGBA1C 5.7 (H) 02/19/2018   Last thyroid functions Lab Results  Component Value Date   TSH 1.250 12/29/2020   Last vitamin D Lab Results  Component Value Date   VD25OH 54.0 02/19/2018   Last vitamin B12 and Folate Lab Results  Component Value Date   VITAMINB12 1,135 07/02/2016       Objective    BP (!) 144/79 (BP Location: Left Arm, Patient Position: Sitting, Cuff Size: Normal)    Pulse 77     Temp 98.4 F (36.9 C) (Oral)    Ht 5' 10" (1.778 m)    Wt 175 lb 6.4 oz (79.6 kg)    SpO2 98%    BMI 25.17 kg/m  BP Readings from Last 3 Encounters:  05/10/21 (!) 144/79  01/24/21 (!) 145/81  12/29/20 132/71   Wt Readings from Last 3 Encounters:  05/10/21 175 lb 6.4 oz (79.6 kg)  01/24/21 174 lb (78.9 kg)  12/29/20 177 lb (80.3 kg)      Physical Exam Constitutional:      Appearance: He is not ill-appearing.  HENT:     Head: Normocephalic.  Eyes:     Conjunctiva/sclera: Conjunctivae normal.  Cardiovascular:     Rate and Rhythm: Normal rate.     Pulses:          Dorsalis pedis pulses are 2+ on the left side.  Pulmonary:     Effort: Pulmonary effort is normal. No respiratory distress.  Musculoskeletal:     Left ankle: Swelling present.     Left Achilles Tendon: Tenderness present.     Comments: Swelling surrounding inferior achilles tendon, acutely tender to touch.    Feet:     Left foot:     Protective Sensation: 3 sites tested.  2 sites sensed.     Comments: Denies point sensation to heel.  Neurological:     General: No focal deficit present.     Mental Status: He is alert and oriented to person, place, and time.  Psychiatric:        Mood and Affect: Mood normal.        Behavior: Behavior normal.     No results found for any visits on 05/10/21.  Assessment & Plan     Problem List Items Addressed This Visit       Musculoskeletal and Integument   Insertional Achilles tendinopathy - Primary    I believe his current numbness is secondary to increased inflammation due to the hard shoe insert. On review of his chart-- He was recently seen by [name] [facility] 05/03/2021 where they confirmed the diagnosis of L achilles tendinopathy, recommended he wear the CAM boot 24/7, and rx celebrex for reduction of inflammation.   We discussed at length the importance in following the orthopedic's recommendations of using Celebrex and the CAM boot.  I explained the purpose of the CAM boot and why that form of stabilization is necessary vs the hard insert. Advised he d/c the hard insert. Pt expressed hesitancy in wearing the boot-I advised he wear as much as possible, ice, and take the Celebrex, which will not immediately work, but decreases inflammation over time.  Return if symptoms worsen or fail to improve.      I, Mikey Kirschner, PA-C have reviewed all documentation for this visit. The documentation on  05/10/2021 for the exam, diagnosis, procedures, and orders are all accurate and complete.    Mikey Kirschner, PA-C  [facility] [phone] (phone) [phone] (fax)  [place]

## 2021-05-10 ENCOUNTER — Encounter: Payer: Self-pay | Admitting: Physician Assistant

## 2021-05-10 ENCOUNTER — Ambulatory Visit (INDEPENDENT_AMBULATORY_CARE_PROVIDER_SITE_OTHER): Payer: Commercial Managed Care - PPO | Admitting: Physician Assistant

## 2021-05-10 ENCOUNTER — Other Ambulatory Visit: Payer: Self-pay

## 2021-05-10 VITALS — BP 144/79 | HR 77 | Temp 98.4°F | Ht 70.0 in | Wt 175.4 lb

## 2021-05-10 DIAGNOSIS — M7660 Achilles tendinitis, unspecified leg: Secondary | ICD-10-CM | POA: Diagnosis not present

## 2021-05-10 NOTE — Assessment & Plan Note (Addendum)
I believe his current numbness is secondary to increased inflammation due to the hard shoe insert. On review of his chart-- He was recently seen by [place] [facility] 05/03/2021 where they confirmed the diagnosis of L achilles tendinopathy, recommended he wear the CAM boot 24/7, and rx celebrex for reduction of inflammation.   We discussed at length the importance in following the orthopedic's recommendations of using Celebrex and the CAM boot. I explained the purpose of the CAM boot and why that form of stabilization is necessary vs the hard insert. Advised he d/c the hard insert. Pt expressed hesitancy in wearing the boot-I advised he wear as much as possible, ice, and take the Celebrex, which will not immediately work, but decreases inflammation over time.

## 2021-06-26 DIAGNOSIS — I83893 Varicose veins of bilateral lower extremities with other complications: Secondary | ICD-10-CM | POA: Diagnosis not present

## 2021-07-13 ENCOUNTER — Other Ambulatory Visit: Payer: Self-pay | Admitting: Podiatry

## 2021-07-13 DIAGNOSIS — G5792 Unspecified mononeuropathy of left lower limb: Secondary | ICD-10-CM

## 2021-07-13 DIAGNOSIS — M7732 Calcaneal spur, left foot: Secondary | ICD-10-CM

## 2021-07-13 DIAGNOSIS — G8929 Other chronic pain: Secondary | ICD-10-CM

## 2021-07-13 DIAGNOSIS — M7662 Achilles tendinitis, left leg: Secondary | ICD-10-CM

## 2021-07-24 ENCOUNTER — Ambulatory Visit: Payer: Commercial Managed Care - PPO

## 2021-07-24 NOTE — Progress Notes (Signed)
? ?I,Elena D DeSanto,acting as a scribe for [name], MD.,have documented all relevant documentation on the behalf of [name], MD,as directed by  [name], MD while in the presence of [name], MD. ?  ? ? ?Established patient visit ? ? ?Patient: Robert Patton   DOB: [birth year]   71 y.o. Male  MRN: [id number] ?Visit Date: 07/25/2021 ? ?Today's healthcare provider: [name], MD  ? ?No chief complaint on file. ? ?Subjective  ?  ?HPI  ?Patient comes in today for follow-up.  He continues to work. ?He states his home blood pressures are good. ?1 foot problems are followed by podiatry. ?Hypertension, follow-up ? ?BP Readings from Last 3 Encounters:  ?07/25/21 (!) 141/82  ?05/10/21 (!) 144/79  ?01/24/21 (!) 145/81  ? Wt Readings from Last 3 Encounters:  ?07/25/21 170 lb (77.1 kg)  ?05/10/21 175 lb 6.4 oz (79.6 kg)  ?01/24/21 174 lb (78.9 kg)  ?  ? ?He was last seen for hypertension 6 months ago.  ?BP at that visit was as above. Management since that visit includes none. ? ?He reports good compliance with treatment. ?He  as above  having side effects.  ?He is following a Regular diet. ?He is exercising. ?He does not smoke. ? ?Use of agents associated with hypertension: none.  ? ?Outside blood pressures are patient just reports they are good. ?Symptoms: ?No chest pain No chest pressure  ?No palpitations No syncope  ?No dyspnea No orthopnea  ?No paroxysmal nocturnal dyspnea No lower extremity edema  ? ?Pertinent labs: ?Lab Results  ?Component Value Date  ? CHOL 184 12/29/2020  ? HDL 75 12/29/2020  ? [place] 99 12/29/2020  ? TRIG 51 12/29/2020  ? CHOLHDL 2.5 12/29/2020  ? Lab Results  ?Component Value Date  ? NA 141 12/29/2020  ? K 3.8 12/29/2020  ? CREATININE 1.09 12/29/2020  ? EGFR 73 12/29/2020  ? GLUCOSE 99 12/29/2020  ? TSH 1.250 12/29/2020  ?  ? ?The 10-year ASCVD risk score (Arnett DK, et al., 2019) is: 20.9%   ? ?--------------------------------------------------------------------------------------------------- ? ? ?Medications: ?Outpatient Medications Prior to Visit  ?Medication Sig  ? acetaminophen (TYLENOL) 500 MG tablet Take 1,000 mg by mouth every 6 (six) hours as needed (FOR PAIN.).  ? amLODipine (NORVASC) 5 MG tablet TAKE 1 TABLET(5 MG) BY MOUTH DAILY  ? Calcium-Magnesium-Zinc (CAL-MAG-ZINC PO) Take 1 tablet by mouth daily.  ? carboxymethylcellulose 1 % ophthalmic solution Place 1-2 drops into both eyes 2 (two) times daily.  ? chlorthalidone (HYGROTON) 50 MG tablet TAKE 1 TABLET(50 MG) BY MOUTH EVERY MORNING  ? cyclobenzaprine (FLEXERIL) 5 MG tablet Take 1-2 tablets (5-10 mg total) by mouth every evening.  ? losartan (COZAAR) 100 MG tablet TAKE 1 TABLET(100 MG) BY MOUTH DAILY  ? Multiple Vitamin (MULTIVITAMIN WITH MINERALS) TABS tablet Take 1 tablet by mouth daily.  ? tadalafil (CIALIS) 5 MG tablet Take 5 mg by mouth daily.  ? traMADol (ULTRAM) 50 MG tablet Take 50 mg by mouth every 6 (six) hours as needed.  ? Vitamin D, Ergocalciferol, (DRISDOL) 1.25 MG (50000 UNIT) CAPS capsule TAKE 1 CAPSULE BY MOUTH EVERY 7 DAYS  ? ?No facility-administered medications prior to visit.  ? ? ?Review of Systems ? ? ?  Objective  ?  ?BP (!) 141/82 (BP Location: Left Arm, Patient Position: Sitting, Cuff Size: Normal)   Pulse 74   Temp 98.4 ?F (36.9 ?C) (Oral)   Wt 170 lb (77.1 kg)  SpO2 99%   BMI 24.39 kg/m?  ?  ? ?Physical Exam ?Vitals and nursing note reviewed. Exam conducted with a chaperone present.  ?Constitutional:   ?   Appearance: Normal appearance.  ?Cardiovascular:  ?   Rate and Rhythm: Normal rate and regular rhythm.  ?Pulmonary:  ?   Effort: Pulmonary effort is normal.  ?   Breath sounds: Normal breath sounds.  ?Abdominal:  ?   Palpations: Abdomen is soft.  ?Musculoskeletal:     ?   General: Normal range of motion.  ?   Comments: Foot Exam benign.  ?Skin: ?   General: Skin is warm and dry.  ?Neurological:  ?    Mental Status: He is alert and oriented to person, place, and time.  ?Psychiatric:     ?   Mood and Affect: Mood normal.     ?   Behavior: Behavior normal.  ?  ? ? ?No results found for any visits on 07/25/21. ? Assessment & Plan  ?  ? ?1. Essential (primary) hypertension ?Good control with home blood pressure readings being very good ? ?2. Insertional Achilles tendinopathy ?Podiatry. ? ?3. Elevated PSA ?PSA on next visit. ? ? ?No follow-ups on file.  ?   ? ?I, [name], MD, have reviewed all documentation for this visit. The documentation on 07/27/21 for the exam, diagnosis, procedures, and orders are all accurate and complete. ? ? ? ?Rawleigh Rode Cranford Mon, MD  ?[facility] ?[phone] (phone) ?[phone] (fax) ? ?[facility] ?

## 2021-07-25 ENCOUNTER — Ambulatory Visit (INDEPENDENT_AMBULATORY_CARE_PROVIDER_SITE_OTHER): Payer: Commercial Managed Care - PPO | Admitting: Family Medicine

## 2021-07-25 ENCOUNTER — Other Ambulatory Visit: Payer: Self-pay

## 2021-07-25 VITALS — BP 141/82 | HR 74 | Temp 98.4°F | Wt 170.0 lb

## 2021-07-25 DIAGNOSIS — M7660 Achilles tendinitis, unspecified leg: Secondary | ICD-10-CM

## 2021-07-25 DIAGNOSIS — I10 Essential (primary) hypertension: Secondary | ICD-10-CM | POA: Diagnosis not present

## 2021-07-25 DIAGNOSIS — R972 Elevated prostate specific antigen [PSA]: Secondary | ICD-10-CM | POA: Diagnosis not present

## 2021-07-27 ENCOUNTER — Ambulatory Visit
Admission: RE | Admit: 2021-07-27 | Discharge: 2021-07-27 | Disposition: A | Discharge status: Home / Self Care | Payer: Commercial Managed Care - PPO | Source: Ambulatory Visit | Attending: Podiatry | Admitting: Podiatry

## 2021-07-27 DIAGNOSIS — M7662 Achilles tendinitis, left leg: Secondary | ICD-10-CM

## 2021-07-27 DIAGNOSIS — S86012A Strain of left Achilles tendon, initial encounter: Secondary | ICD-10-CM | POA: Diagnosis not present

## 2021-07-27 DIAGNOSIS — G8929 Other chronic pain: Secondary | ICD-10-CM | POA: Diagnosis not present

## 2021-07-27 DIAGNOSIS — M19072 Primary osteoarthritis, left ankle and foot: Secondary | ICD-10-CM | POA: Diagnosis not present

## 2021-07-27 DIAGNOSIS — J42 Unspecified chronic bronchitis: Secondary | ICD-10-CM | POA: Insufficient documentation

## 2021-07-27 DIAGNOSIS — M7732 Calcaneal spur, left foot: Secondary | ICD-10-CM

## 2021-07-27 DIAGNOSIS — M79672 Pain in left foot: Secondary | ICD-10-CM | POA: Insufficient documentation

## 2021-07-27 DIAGNOSIS — R200 Anesthesia of skin: Secondary | ICD-10-CM | POA: Diagnosis not present

## 2021-07-27 DIAGNOSIS — G5792 Unspecified mononeuropathy of left lower limb: Secondary | ICD-10-CM | POA: Diagnosis not present

## 2021-08-13 ENCOUNTER — Other Ambulatory Visit: Payer: Self-pay | Admitting: Family Medicine

## 2021-08-13 DIAGNOSIS — N5201 Erectile dysfunction due to arterial insufficiency: Secondary | ICD-10-CM | POA: Diagnosis not present

## 2021-08-13 DIAGNOSIS — R350 Frequency of micturition: Secondary | ICD-10-CM | POA: Diagnosis not present

## 2021-08-13 DIAGNOSIS — I10 Essential (primary) hypertension: Secondary | ICD-10-CM

## 2021-08-13 DIAGNOSIS — R972 Elevated prostate specific antigen [PSA]: Secondary | ICD-10-CM | POA: Diagnosis not present

## 2021-08-13 DIAGNOSIS — N401 Enlarged prostate with lower urinary tract symptoms: Secondary | ICD-10-CM | POA: Diagnosis not present

## 2021-08-16 ENCOUNTER — Other Ambulatory Visit: Payer: Self-pay | Admitting: Urology

## 2021-08-16 DIAGNOSIS — R972 Elevated prostate specific antigen [PSA]: Secondary | ICD-10-CM

## 2021-08-23 ENCOUNTER — Ambulatory Visit
Admission: RE | Admit: 2021-08-23 | Discharge: 2021-08-23 | Disposition: A | Discharge status: Home / Self Care | Payer: Commercial Managed Care - PPO | Source: Ambulatory Visit | Attending: Urology | Admitting: Urology

## 2021-08-23 DIAGNOSIS — R972 Elevated prostate specific antigen [PSA]: Secondary | ICD-10-CM | POA: Diagnosis not present

## 2021-08-23 DIAGNOSIS — N400 Enlarged prostate without lower urinary tract symptoms: Secondary | ICD-10-CM | POA: Diagnosis not present

## 2021-08-23 DIAGNOSIS — K4090 Unilateral inguinal hernia, without obstruction or gangrene, not specified as recurrent: Secondary | ICD-10-CM | POA: Diagnosis not present

## 2021-08-23 DIAGNOSIS — N402 Nodular prostate without lower urinary tract symptoms: Secondary | ICD-10-CM | POA: Diagnosis not present

## 2021-08-23 MED ORDER — GADOBUTROL 1 MMOL/ML IV SOLN
7.0000 mL | Freq: Once | INTRAVENOUS | Status: AC | PRN
  Administered 2021-08-23: 7 mL via INTRAVENOUS

## 2021-08-27 DIAGNOSIS — D400 Neoplasm of uncertain behavior of prostate: Secondary | ICD-10-CM | POA: Diagnosis not present

## 2021-08-27 DIAGNOSIS — R972 Elevated prostate specific antigen [PSA]: Secondary | ICD-10-CM | POA: Diagnosis not present

## 2021-09-05 DIAGNOSIS — H5203 Hypermetropia, bilateral: Secondary | ICD-10-CM | POA: Diagnosis not present

## 2021-09-05 DIAGNOSIS — H5213 Myopia, bilateral: Secondary | ICD-10-CM | POA: Diagnosis not present

## 2021-09-05 DIAGNOSIS — H524 Presbyopia: Secondary | ICD-10-CM | POA: Diagnosis not present

## 2021-09-05 DIAGNOSIS — H259 Unspecified age-related cataract: Secondary | ICD-10-CM | POA: Diagnosis not present

## 2021-09-21 ENCOUNTER — Other Ambulatory Visit: Payer: Self-pay | Admitting: Family Medicine

## 2021-09-21 DIAGNOSIS — I10 Essential (primary) hypertension: Secondary | ICD-10-CM

## 2021-09-21 NOTE — Telephone Encounter (Signed)
Copied from [place] [id number]. Topic: General - Other ?>> Sep 21, 2021 11:19 AM Tessa Lerner A wrote: ?Reason for CRM: Medication Refill - Medication: losartan (COZAAR) 100 MG tablet [[id number]]  ? ?Has the patient contacted their pharmacy? No.  The patient has been directed to contact their pharmacy for future refills  ?(Agent: If no, request that the patient contact the pharmacy for the refill. If patient does not wish to contact the pharmacy document the reason why and proceed with request.) ?(Agent: If yes, when and what did the pharmacy advise?) ? ?Preferred Pharmacy (with phone number or street name): [id number] [address], [place] ?[place] [address] ?Phone: [phone] Fax: [phone] ?Hours: Not open 24 hours ? ?Has the patient been seen for an appointment in the last year OR does the patient have an upcoming appointment? Yes.   ? ?Agent: Please be advised that RX refills may take up to 3 business days. We ask that you follow-up with your pharmacy. ?

## 2021-09-21 NOTE — Telephone Encounter (Signed)
Requested medication (s) are due for refill today: Yes ? ?Requested medication (s) are on the active medication list: Yes ? ?Last refill:  04/14/21 ? ?Future visit scheduled: Yes ? ?Notes to clinic:  Protocol indicates pt. Needs lab work. ? ? ? ?Requested Prescriptions  ?Pending Prescriptions Disp Refills  ? losartan (COZAAR) 100 MG tablet 90 tablet 0  ?  ? Cardiovascular:  Angiotensin Receptor Blockers Failed - 09/21/2021 12:04 PM  ?  ?  Failed - Cr in normal range and within 180 days  ?  Creat  ?Date Value Ref Range Status  ?02/04/2017 0.88 0.70 - 1.25 mg/dL Final  ?  Comment:  ?  For patients >34 years of age, the reference limit ?for Creatinine is approximately 13% higher for people ?identified as African-American. ?. ?  ? ?Creatinine, Ser  ?Date Value Ref Range Status  ?12/29/2020 1.09 0.76 - 1.27 mg/dL Final  ?  ?  ?  ?  Failed - K in normal range and within 180 days  ?  Potassium  ?Date Value Ref Range Status  ?12/29/2020 3.8 3.5 - 5.2 mmol/L Final  ?  ?  ?  ?  Failed - Last BP in normal range  ?  BP Readings from Last 1 Encounters:  ?07/25/21 (!) 141/82  ?  ?  ?  ?  Passed - Patient is not pregnant  ?  ?  Passed - Valid encounter within last 6 months  ?  Recent Outpatient Visits   ? ?      ? 1 month ago Essential (primary) hypertension  ? [facility] Jerrol Banana., MD  ? 4 months ago Insertional Achilles tendinopathy  ? [facility] Thedore Mins, Ria Comment, PA-C  ? 8 months ago Annual physical exam  ? [facility] Jerrol Banana., MD  ? 8 months ago Venous insufficiency, peripheral  ? [place], PA-C  ? 1 year ago Essential (primary) hypertension  ? [facility] Jerrol Banana., MD  ? ?  ?  ? ? ?  ?  ?  ? ?

## 2021-09-24 MED ORDER — LOSARTAN POTASSIUM 100 MG PO TABS: 50.0000 mg | ORAL_TABLET | Freq: Every day | ORAL | 0 refills | Status: DC

## 2021-09-25 DIAGNOSIS — L539 Erythematous condition, unspecified: Secondary | ICD-10-CM | POA: Diagnosis not present

## 2021-09-25 DIAGNOSIS — R609 Edema, unspecified: Secondary | ICD-10-CM | POA: Diagnosis not present

## 2021-09-25 DIAGNOSIS — R200 Anesthesia of skin: Secondary | ICD-10-CM | POA: Diagnosis not present

## 2021-09-25 DIAGNOSIS — R202 Paresthesia of skin: Secondary | ICD-10-CM | POA: Diagnosis not present

## 2021-10-08 ENCOUNTER — Ambulatory Visit: Payer: Self-pay | Admitting: *Deleted

## 2021-10-08 NOTE — Telephone Encounter (Signed)
?  Chief Complaint: + COVID ?Symptoms: dry mouth- patient did have other symptoms- but he reports he is feeling much better and improving ?Frequency: symptoms started Thursday- tested Saturday ?Pertinent Negatives: Patient denies fever cough, headache ?Disposition: '[]'$ ED /'[]'$ Urgent Care (no appt availability in office) / '[]'$ Appointment(In office/virtual)/ '[]'$  [place] [facility]/ '[x]'$ Home Care/ '[]'$ Refused Recommended Disposition /'[]'$ [employer] [facility]/ '[]'$  Follow-up with PCP ?Additional Notes: Patient advised home care per COVID protocol- advised call office if he gets worse.  ?

## 2021-10-08 NOTE — Telephone Encounter (Signed)
Summary: cold and flu like symptoms / rx request  ? The patient has tested positive for COVID on 10/06/21 at [facility]  ? ?The patient is experiencing a dry mouth and cough  ? ?The patient would like to be prescribed something for their discomfort  ? ?Please contact further when possible   ?  ? ?Reason for Disposition ? [1] COVID-19 diagnosed by positive lab test (e.g., PCR, rapid self-test kit) AND [2] mild symptoms (e.g., cough, fever, others) AND [1] no complications or SOB ? ?Answer Assessment - Initial Assessment Questions ?1. COVID-19 DIAGNOSIS: "Who made your COVID-19 diagnosis?" "Was it confirmed by a positive lab test or self-test?" If not diagnosed by a doctor (or NP/PA), ask "Are there lots of cases (community spread) where you live?" Note: See public health department website, if unsure. ?    Saturday- + COVID ?2. COVID-19 EXPOSURE: "Was there any known exposure to COVID before the symptoms began?" CDC Definition of close contact: within 6 feet (2 meters) for a total of 15 minutes or more over a 24-hour period.  ?    Vacation/wedding ?3. ONSET: "When did the COVID-19 symptoms start?"  ?    Symptoms started Thursday ?4. WORST SYMPTOM: "What is your worst symptom?" (e.g., cough, fever, shortness of breath, muscle aches) ?    Dry mouth ?5. COUGH: "Do you have a cough?" If Yes, ask: "How bad is the cough?"   ?    no ?6. FEVER: "Do you have a fever?" If Yes, ask: "What is your temperature, how was it measured, and when did it start?" ?    no ?7. RESPIRATORY STATUS: "Describe your breathing?" (e.g., shortness of breath, wheezing, unable to speak)  ?    Normal breathing ?8. BETTER-SAME-WORSE: "Are you getting better, staying the same or getting worse compared to yesterday?"  If getting worse, ask, "In what way?" ?    better ?9. HIGH RISK DISEASE: "Do you have any chronic medical problems?" (e.g., asthma, heart or lung disease, weak immune system, obesity, etc.) ?    Age ?10. VACCINE: "Have you had the COVID-19  vaccine?" If Yes, ask: "Which one, how many shots, when did you get it?" ?      yes ?11. BOOSTER: "Have you received your COVID-19 booster?" If Yes, ask: "Which one and when did you get it?" ?      yes ?12. PREGNANCY: "Is there any chance you are pregnant?" "When was your last menstrual period?" ?      na ?13. OTHER SYMPTOMS: "Do you have any other symptoms?"  (e.g., chills, fatigue, headache, loss of smell or taste, muscle pain, sore throat) ?      Did have symptoms- but all are better ?14. O2 SATURATION MONITOR:  "Do you use an oxygen saturation monitor (pulse oximeter) at home?" If Yes, ask "What is your reading (oxygen level) today?" "What is your usual oxygen saturation reading?" (e.g., 95%) ? ?Protocols used: Coronavirus (OXWRU-04) Diagnosed or Suspected-A-AH ? ?

## 2021-10-16 ENCOUNTER — Other Ambulatory Visit: Payer: Self-pay | Admitting: Family Medicine

## 2021-10-16 ENCOUNTER — Ambulatory Visit: Payer: Self-pay

## 2021-10-16 ENCOUNTER — Ambulatory Visit (INDEPENDENT_AMBULATORY_CARE_PROVIDER_SITE_OTHER): Payer: Commercial Managed Care - PPO | Admitting: Family Medicine

## 2021-10-16 ENCOUNTER — Encounter: Payer: Self-pay | Admitting: Family Medicine

## 2021-10-16 VITALS — BP 162/78 | HR 76 | Temp 98.1°F | Resp 17 | Ht 70.0 in | Wt 166.6 lb

## 2021-10-16 DIAGNOSIS — I10 Essential (primary) hypertension: Secondary | ICD-10-CM | POA: Diagnosis not present

## 2021-10-16 DIAGNOSIS — U071 COVID-19: Secondary | ICD-10-CM | POA: Diagnosis not present

## 2021-10-16 MED ORDER — BENZONATATE 200 MG PO CAPS
200.0000 mg | ORAL_CAPSULE | Freq: Two times a day (BID) | ORAL | 0 refills | Status: DC | PRN
Start: 2021-10-16 — End: 2022-01-30

## 2021-10-16 NOTE — Progress Notes (Deleted)
     I,Tiffany J Bragg,acting as a scribe for Myles Gip, DO.,have documented all relevant documentation on the behalf of Myles Gip, DO,as directed by  Myles Gip, DO while in the presence of Myles Gip, DO.   Established patient visit   Patient: Robert Patton   DOB: Nov 20, 1950   71 y.o. Male  MRN: [id number] Visit Date: 10/16/2021  Today's healthcare provider: Myles Gip, DO   Chief Complaint  Patient presents with   Cough    Patient was covid positive with symptoms starting 2 weeks ago. He complains of a continued dry cough.   Subjective    HPI HPI     Cough    Additional comments: Patient was covid positive with symptoms starting 2 weeks ago. He complains of a continued dry cough.      Last edited by Smitty Knudsen, CMA on 10/16/2021 10:32 AM.      Upper respiratory symptoms He complains of cough described as nonproductive.with no fever, chills, night sweats or weight loss. Onset of symptoms was a few weeks ago and gradually worsening.He is drinking plenty of fluids.  Past history is significant for no history of pneumonia or bronchitis. Patient is non-smoker  ---------------------------------------------------------------------------------------------------   Medications: Outpatient Medications Prior to Visit  Medication Sig   acetaminophen (TYLENOL) 500 MG tablet Take 1,000 mg by mouth every 6 (six) hours as needed (FOR PAIN.).   amLODipine (NORVASC) 5 MG tablet TAKE 1 TABLET(5 MG) BY MOUTH DAILY   Calcium-Magnesium-Zinc (CAL-MAG-ZINC PO) Take 1 tablet by mouth daily.   carboxymethylcellulose 1 % ophthalmic solution Place 1-2 drops into both eyes 2 (two) times daily.   chlorthalidone (HYGROTON) 50 MG tablet TAKE 1 TABLET(50 MG) BY MOUTH EVERY MORNING   cyclobenzaprine (FLEXERIL) 5 MG tablet Take 1-2 tablets (5-10 mg total) by mouth every evening.   gabapentin (NEURONTIN) 100 MG capsule Take 100 mg by mouth 2 (two) times daily.    losartan (COZAAR) 100 MG tablet Take 0.5 tablets (50 mg total) by mouth daily.   Multiple Vitamin (MULTIVITAMIN WITH MINERALS) TABS tablet Take 1 tablet by mouth daily.   tadalafil (CIALIS) 5 MG tablet Take 5 mg by mouth daily.   traMADol (ULTRAM) 50 MG tablet Take 50 mg by mouth every 6 (six) hours as needed.   Vitamin D, Ergocalciferol, (DRISDOL) 1.25 MG (50000 UNIT) CAPS capsule TAKE 1 CAPSULE BY MOUTH EVERY 7 DAYS   No facility-administered medications prior to visit.    Review of Systems  {Labs  Heme  Chem  Endocrine  Serology  Results Review (optional):23779}   Objective    BP (!) 176/92 (BP Location: Left Arm, Patient Position: Sitting, Cuff Size: Normal)   Pulse 76   Temp 98.1 F (36.7 C) (Oral)   Resp 17   Ht '5\' 10"'$  (1.778 m)   Wt 166 lb 9.6 oz (75.6 kg)   SpO2 96%   BMI 23.90 kg/m  {Show previous vital signs (optional):23777}  Physical Exam  ***  No results found for any visits on 10/16/21.  Assessment & Plan     ***  No follow-ups on file.      {provider attestation***:1}   Myles Gip, DO  [facility] [phone] (phone) [phone] (fax)  [place]

## 2021-10-16 NOTE — Assessment & Plan Note (Signed)
Elevated today, slightly improved on recheck. Attributes to lack of sleep due to coughing. Some normal readings at home. Continue current therapy, will continue to monitor at follow up. Recommend consideration of dose adjustment if remains elevated on f/u and out of acute illness.

## 2021-10-16 NOTE — Telephone Encounter (Signed)
Pt called in , hasn't been able to sleep for 3 days,no appt with Dr Rosanna Randy until tomorrow. He is asking for medicine to be sent to help him. [facility] [id number], [place] [address]  Phone: [phone]  Fax: [phone]     Chief Complaint: COVID positive 10/08/21. Persisting dry cough, interrupts sleep. Symptoms: Above Frequency: 10/08/21 Pertinent Negatives: Patient denies fever, SOB Disposition: '[]'$ ED /'[]'$ Urgent Care (no appt availability in office) / '[]'$ Appointment(In office/virtual)/ '[x]'$  [place] [facility]/ '[]'$ Home Care/ '[]'$ Refused Recommended Disposition /'[]'$ [place] [facility]/ '[]'$  Follow-up with PCP Additional Notes:   Reason for Disposition  [1] Continuous (nonstop) coughing interferes with work or school AND [2] no improvement using cough treatment per Care Advice  Answer Assessment - Initial Assessment Questions 1. ONSET: "When did the cough begin?"      3 days ago 2. SEVERITY: "How bad is the cough today?"      Severe 3. SPUTUM: "Describe the color of your sputum" (none, dry cough; clear, white, yellow, green)     None 4. HEMOPTYSIS: "Are you coughing up any blood?" If so ask: "How much?" (flecks, streaks, tablespoons, etc.)     No 5. DIFFICULTY BREATHING: "Are you having difficulty breathing?" If Yes, ask: "How bad is it?" (e.g., mild, moderate, severe)    - MILD: No SOB at rest, mild SOB with walking, speaks normally in sentences, can lie down, no retractions, pulse < 100.    - MODERATE: SOB at rest, SOB with minimal exertion and prefers to sit, cannot lie down flat, speaks in phrases, mild retractions, audible wheezing, pulse 100-120.    - SEVERE: Very SOB at rest, speaks in single words, struggling to breathe, sitting hunched forward, retractions, pulse > 120      No 6. FEVER: "Do you have a fever?" If Yes, ask: "What is your temperature, how was it measured, and when did it start?"     No 7. CARDIAC  HISTORY: "Do you have any history of heart disease?" (e.g., heart attack, congestive heart failure)      No 8. LUNG HISTORY: "Do you have any history of lung disease?"  (e.g., pulmonary embolus, asthma, emphysema)     No 9. PE RISK FACTORS: "Do you have a history of blood clots?" (or: recent major surgery, recent prolonged travel, bedridden)     No 10. OTHER SYMPTOMS: "Do you have any other symptoms?" (e.g., runny nose, wheezing, chest pain)       COVID positive 11. PREGNANCY: "Is there any chance you are pregnant?" "When was your last menstrual period?"       N/a 12. TRAVEL: "Have you traveled out of the country in the last month?" (e.g., travel history, exposures)       No  Protocols used: Cough - Acute Non-Productive-A-AH

## 2021-10-16 NOTE — Progress Notes (Signed)
   SUBJECTIVE:   CHIEF COMPLAINT / HPI:   COVID-19 INFECTION - COVID+ 10/08/21 - symptom onset 3 weeks ago - follow up test negative few days ago.  - hard to sleep due to cough - has received 4 doses of mRNA vaccine, including bivalent - did not receive paxlovid - has had some elevated BP at home but 118/68 last night  Fever: no Cough: yes, slightly productive of mucus Shortness of breath: no Chest pain: no Chest tightness:  with coughing Chest congestion: yes Nasal congestion: no Headache: no Vomiting: no Relief with OTC cold/cough medications: initially yes but not in the last few days.  Treatments attempted:  Corcidin   OBJECTIVE:   BP (!) 162/78   Pulse 76   Temp 98.1 F (36.7 C) (Oral)   Resp 17   Ht '5\' 10"'$  (1.778 m)   Wt 166 lb 9.6 oz (75.6 kg)   SpO2 96%   BMI 23.90 kg/m   Gen: well appearing, in NAD Card: RRR Lungs: CTAB. No wheeze, rales, rhonchi.  Ext: WWP, no edema   ASSESSMENT/PLAN:   COVID-19 Doing well with mild sx. Not a candidate for COVID treatment due to duration of symptoms. Outside of quarantine window. Will trial tessalon perles for cough. Reviewed OTC symptom relief and emergency precautions.   Problem List Items Addressed This Visit       Cardiovascular and Mediastinum   Essential (primary) hypertension    Elevated today, slightly improved on recheck. Attributes to lack of sleep due to coughing. Some normal readings at home. Continue current therapy, will continue to monitor at follow up. Recommend consideration of dose adjustment if remains elevated on f/u and out of acute illness.        Other Visit Diagnoses     COVID-19    -  Primary   Relevant Medications   benzonatate (TESSALON) 200 MG capsule        Myles Gip, DO

## 2021-10-16 NOTE — Patient Instructions (Signed)
It was great to see you!  Our plans for today:  - Try the benzonatate for your cough.  - If your cough worsens, develop fever or difficulty breathing, come back to see [place]  Take care and seek immediate care sooner if you develop any concerns.   Dr. Ky Barban

## 2021-10-30 ENCOUNTER — Other Ambulatory Visit
Admission: RE | Admit: 2021-10-30 | Discharge: 2021-10-30 | Disposition: A | Discharge status: Home / Self Care | Payer: Commercial Managed Care - PPO | Source: Ambulatory Visit | Attending: Urology | Admitting: Urology

## 2021-10-30 VITALS — Ht 70.0 in | Wt 172.0 lb

## 2021-10-30 DIAGNOSIS — I10 Essential (primary) hypertension: Secondary | ICD-10-CM

## 2021-10-30 HISTORY — DX: Unspecified osteoarthritis, unspecified site: M19.90

## 2021-10-30 NOTE — Patient Instructions (Addendum)
Your procedure is scheduled on: Thursday November 08, 2021. Report to Day Surgery inside [facility] 2nd floor, stop by admissions desk before getting on elevator.  To find out your arrival time please call [phone] between 1PM - 3PM on Wednesday November 07, 2021.  Remember: Instructions that are not followed completely may result in serious medical risk,  up to and including death, or upon the discretion of your surgeon and anesthesiologist your  surgery may need to be rescheduled.     _X__ 1. Do not eat food after midnight the night before your procedure.                 No chewing gum or hard candies. You may drink clear liquids up to 2 hours                 before you are scheduled to arrive for your surgery- DO not drink clear                 liquids within 2 hours of the start of your surgery.                 Clear Liquids include:  water, apple juice without pulp, clear Gatorade, G2 or                  Gatorade Zero (avoid Red/Purple/Blue), Black Coffee or Tea (Do not add                 anything to coffee or tea).  __X__2.  On the morning of surgery brush your teeth with toothpaste and water, you                may rinse your mouth with mouthwash if you wish.  Do not swallow any toothpaste or mouthwash.     _X__ 3.  No Alcohol for 24 hours before or after surgery.   _X__ 4.  Do Not Smoke or use e-cigarettes For 24 Hours Prior to Your Surgery.                 Do not use any chewable tobacco products for at least 6 hours prior to                 Surgery.  _X__  5.  Do not use any recreational drugs (marijuana, cocaine, heroin, ecstasy, MDMA or other)                For at least one week prior to your surgery.  Combination of these drugs with anesthesia                May have life threatening results.  ____  6.  Bring all medications with you on the day of surgery if instructed.   __X_ 7.  Notify your doctor if there is any change in your medical condition       (cold, fever, infections).     Do not wear jewelry, make-up, hairpins, clips or nail polish. Do not wear lotions, powders, or perfumes. You may wear deodorant. Do not shave 48 hours prior to surgery. Men may shave face and neck. Do not bring valuables to the hospital.    [facility] is not responsible for any belongings or valuables.  Contacts, dentures or bridgework may not be worn into surgery. Leave your suitcase in the car. After surgery it may be brought to your room. For patients admitted to the hospital, discharge time is determined  by your treatment team.   Patients discharged the day of surgery will not be allowed to drive home.   Make arrangements for someone to be with you for the first 24 hours of your Same Day Discharge.    __X__ Take these medicines the morning of surgery with A SIP OF WATER:    1. amLODipine (NORVASC) 5 MG  2. gabapentin (NEURONTIN) 100 MG  3.   4.  5.  6.  __X__ Fleet Enema (as directed)   ____ Use CHG Soap (or wipes) as directed  ____ Use Benzoyl Peroxide Gel as instructed  ____ Use inhalers on the day of surgery  ____ Stop metformin 2 days prior to surgery    ____ Take 1/2 of usual insulin dose the night before surgery. No insulin the morning          of surgery.   ____ Call your PCP, cardiologist, or Pulmonologist if taking Coumadin/Plavix/aspirin and ask when to stop before your surgery.   __X__ One Week prior to surgery- Stop Anti-inflammatories such as Ibuprofen, Aleve, Advil, Motrin, meloxicam (MOBIC), diclofenac, etodolac, ketorolac, Toradol, Daypro, piroxicam, Goody's or BC powders. OK TO USE TYLENOL IF NEEDED   _X___ Stop ALL supplements until after surgery. Ascorbic Acid (VITAMIN C), Calcium-Magnesium-Zinc (CAL-MAG-ZINC), Multiple Vitamin   ____ Bring C-Pap to the hospital.    If you have any questions regarding your pre-procedure instructions,  Please call Pre-admit Testing at [phone]

## 2021-11-01 ENCOUNTER — Encounter
Admission: RE | Admit: 2021-11-01 | Discharge: 2021-11-01 | Disposition: A | Discharge status: Home / Self Care | Payer: Commercial Managed Care - PPO | Source: Ambulatory Visit | Attending: Urology | Admitting: Urology

## 2021-11-01 DIAGNOSIS — I10 Essential (primary) hypertension: Secondary | ICD-10-CM | POA: Diagnosis not present

## 2021-11-01 DIAGNOSIS — Z01818 Encounter for other preprocedural examination: Secondary | ICD-10-CM | POA: Insufficient documentation

## 2021-11-01 DIAGNOSIS — Z01810 Encounter for preprocedural cardiovascular examination: Secondary | ICD-10-CM | POA: Diagnosis not present

## 2021-11-01 LAB — BASIC METABOLIC PANEL
Anion gap: 8 (ref 5–15)
BUN: 18 mg/dL (ref 8–23)
CO2: 28 mmol/L (ref 22–32)
Calcium: 8.7 mg/dL — ABNORMAL LOW (ref 8.9–10.3)
Chloride: 105 mmol/L (ref 98–111)
Creatinine, Ser: 0.86 mg/dL (ref 0.61–1.24)
GFR, Estimated: >60 mL/min (ref >60)
Glucose, Bld: 93 mg/dL (ref 70–99)
Potassium: 3.5 mmol/L (ref 3.5–5.1)
Sodium: 141 mmol/L (ref 135–145)

## 2021-11-01 LAB — CBC
HCT: 37.2 % — ABNORMAL LOW (ref 39.0–52.0)
Hemoglobin: 12.3 g/dL — ABNORMAL LOW (ref 13.0–17.0)
MCH: 28.3 pg (ref 26.0–34.0)
MCHC: 33.1 g/dL (ref 30.0–36.0)
MCV: 85.5 fL (ref 80.0–100.0)
Platelets: 196 10*3/uL (ref 150–400)
RBC: 4.35 MIL/uL (ref 4.22–5.81)
RDW: 15.4 % (ref 11.5–15.5)
WBC: 5.3 10*3/uL (ref 4.0–10.5)
nRBC: 0 % (ref 0.0–0.2)

## 2021-11-06 NOTE — H&P (Signed)
NAME: Robert Patton, Robert A. MEDICAL RECORD NO: [id number] ACCOUNT NO: [id number] DATE OF BIRTH: 09-01-1950 FACILITY: [facility] LOCATION: [facility] PHYSICIAN: Otelia Limes. Yves Dill, MD  History and Physical   DATE OF ADMISSION: 11/08/2021  Same day surgery 11/08/2021.  CHIEF COMPLAINT:  Elevated PSA and abnormal prostate gland MRI scan.  HISTORY OF PRESENT ILLNESS:  The patient is a 71 year old African-American male with an elevated PSA of 9.4 ng/mL on 08/13/2021.  He was further evaluated with an Exosome score that was elevated at 73.13.  MRI scan dated 08/13/2021 indicated a 79.9 mL  prostate with a 0.29 mL PI-RADS category 3 lesion on the left side.  He comes in now for a UroNav fusion biopsy of the prostate gland.  PAST MEDICAL HISTORY:   ALLERGIES:  No drug allergies.  CURRENT MEDICATIONS:  Chlorthalidone, losartan, vitamin E, multivitamins and sildenafil.  PAST SURGICAL HISTORY:  Right inguinal herniorrhaphy 2019.  PAST AND CURRENT MEDICAL CONDITIONS:  1.  Hypertension. 2.  Erectile dysfunction. 3.  BPH with minimal lower urinary tract symptoms.  REVIEW OF SYSTEMS:  The patient denies chest pain, shortness of breath, diabetes, stroke or heart disease.  FAMILY HISTORY: Negative for urologic disease and prostate cancer.  SOCIAL HISTORY:  The patient denied tobacco or alcohol use.  PHYSICAL EXAMINATION:   VITAL SIGNS:  Height was 5 feet 8 inches, weight 169 pounds, BMI was 27. GENERAL:  Well-nourished African-American male in no acute distress. HEENT:  Sclerae were clear.  Pupils are equally round, reactive to light and accommodation.  Extraocular motions intact. NECK:  No palpable masses or tenderness.  No audible carotid bruits. LYMPHATIC:  No palpable cervical or inguinal adenopathy. PULMONARY:  Lungs clear to auscultation. CARDIOVASCULAR:  Regular rhythm and rate without audible murmurs. ABDOMEN:  Soft, nontender abdomen.  No CVA tenderness. GENITOURINARY:  Uncircumcised.  Testes  smooth, nontender, approximately 18 mL size each. RECTAL: 50 gram smooth, nontender prostate. NEUROMUSCULAR:  Alert and oriented x3.  IMPRESSION:  1.  Elevated PSA. 2.  Abnormal prostate MRI scan with a 0.29 mL PI-RADS category 3 lesion on the left side. 3.  Elevated Exosome IntelliScore.  PLAN:  UroNav fusion biopsy of the prostate.   PAA D: 11/02/2021 12:03:47 pm T: 11/02/2021 12:52:00 pm  JOB: [id number]/ [id number]

## 2021-11-08 ENCOUNTER — Ambulatory Visit: Payer: Commercial Managed Care - PPO | Admitting: Anesthesiology

## 2021-11-08 ENCOUNTER — Encounter
Admission: RE | Disposition: A | Discharge status: Home / Self Care | Payer: Self-pay | Source: Ambulatory Visit | Attending: Urology

## 2021-11-08 ENCOUNTER — Ambulatory Visit
Admission: RE | Admit: 2021-11-08 | Discharge: 2021-11-08 | Disposition: A | Discharge status: Home / Self Care | Payer: Commercial Managed Care - PPO | Source: Ambulatory Visit | Attending: Urology | Admitting: Urology

## 2021-11-08 ENCOUNTER — Encounter: Payer: Self-pay | Admitting: Urology

## 2021-11-08 DIAGNOSIS — E785 Hyperlipidemia, unspecified: Secondary | ICD-10-CM | POA: Insufficient documentation

## 2021-11-08 DIAGNOSIS — I10 Essential (primary) hypertension: Secondary | ICD-10-CM | POA: Diagnosis not present

## 2021-11-08 DIAGNOSIS — R972 Elevated prostate specific antigen [PSA]: Secondary | ICD-10-CM | POA: Diagnosis not present

## 2021-11-08 DIAGNOSIS — N403 Nodular prostate with lower urinary tract symptoms: Secondary | ICD-10-CM | POA: Diagnosis not present

## 2021-11-08 DIAGNOSIS — R9389 Abnormal findings on diagnostic imaging of other specified body structures: Secondary | ICD-10-CM | POA: Diagnosis not present

## 2021-11-08 HISTORY — PX: PROSTATE BIOPSY: SHX241

## 2021-11-08 SURGERY — BIOPSY, PROSTATE
Anesthesia: General | Site: Prostate

## 2021-11-08 MED ORDER — PROPOFOL 10 MG/ML IV BOLUS
INTRAVENOUS | Status: AC
  Filled 2021-11-08: qty 20

## 2021-11-08 MED ORDER — PROPOFOL 1000 MG/100ML IV EMUL
INTRAVENOUS | Status: AC
  Filled 2021-11-08: qty 100

## 2021-11-08 MED ORDER — ORAL CARE MOUTH RINSE: 15.0000 mL | Freq: Once | OROMUCOSAL | Status: AC

## 2021-11-08 MED ORDER — CEFAZOLIN SODIUM-DEXTROSE 1-4 GM/50ML-% IV SOLN
INTRAVENOUS | Status: AC
  Filled 2021-11-08: qty 50

## 2021-11-08 MED ORDER — PHENYLEPHRINE 80 MCG/ML (10ML) SYRINGE FOR IV PUSH (FOR BLOOD PRESSURE SUPPORT)
PREFILLED_SYRINGE | INTRAVENOUS | Status: DC | PRN
  Administered 2021-11-08 (×2): 80 ug via INTRAVENOUS
  Administered 2021-11-08: 160 ug via INTRAVENOUS

## 2021-11-08 MED ORDER — LEVOFLOXACIN 500 MG PO TABS: 500.0000 mg | ORAL_TABLET | Freq: Every day | ORAL | 0 refills | Status: DC

## 2021-11-08 MED ORDER — LIDOCAINE HCL (PF) 2 % IJ SOLN
INTRAMUSCULAR | Status: AC
  Filled 2021-11-08: qty 5

## 2021-11-08 MED ORDER — MIDAZOLAM HCL 2 MG/2ML IJ SOLN
INTRAMUSCULAR | Status: AC
  Filled 2021-11-08: qty 2

## 2021-11-08 MED ORDER — MIDAZOLAM HCL 2 MG/2ML IJ SOLN
INTRAMUSCULAR | Status: DC | PRN
  Administered 2021-11-08 (×2): .5 mg via INTRAVENOUS

## 2021-11-08 MED ORDER — PROPOFOL 10 MG/ML IV BOLUS
INTRAVENOUS | Status: AC
  Filled 2021-11-08: qty 40

## 2021-11-08 MED ORDER — OXYCODONE HCL 5 MG/5ML PO SOLN: 5.0000 mg | Freq: Once | ORAL | Status: DC | PRN

## 2021-11-08 MED ORDER — OXYCODONE HCL 5 MG PO TABS: 5.0000 mg | ORAL_TABLET | Freq: Once | ORAL | Status: DC | PRN

## 2021-11-08 MED ORDER — PROPOFOL 10 MG/ML IV BOLUS
INTRAVENOUS | Status: DC | PRN
  Administered 2021-11-08: 30 mg via INTRAVENOUS

## 2021-11-08 MED ORDER — FENTANYL CITRATE (PF) 100 MCG/2ML IJ SOLN: 25.0000 ug | INTRAMUSCULAR | Status: DC | PRN

## 2021-11-08 MED ORDER — PHENYLEPHRINE HCL-NACL 20-0.9 MG/250ML-% IV SOLN
INTRAVENOUS | Status: AC
  Filled 2021-11-08: qty 250

## 2021-11-08 MED ORDER — ONDANSETRON HCL 4 MG/2ML IJ SOLN
INTRAMUSCULAR | Status: AC
  Filled 2021-11-08: qty 2

## 2021-11-08 MED ORDER — PROPOFOL 500 MG/50ML IV EMUL
INTRAVENOUS | Status: DC | PRN
  Administered 2021-11-08: 175 ug/kg/min via INTRAVENOUS

## 2021-11-08 MED ORDER — FENTANYL CITRATE (PF) 100 MCG/2ML IJ SOLN
INTRAMUSCULAR | Status: AC
  Filled 2021-11-08: qty 2

## 2021-11-08 MED ORDER — FENTANYL CITRATE (PF) 100 MCG/2ML IJ SOLN
INTRAMUSCULAR | Status: DC | PRN
  Administered 2021-11-08: 25 ug via INTRAVENOUS
  Administered 2021-11-08: 50 ug via INTRAVENOUS

## 2021-11-08 MED ORDER — ONDANSETRON HCL 4 MG/2ML IJ SOLN
INTRAMUSCULAR | Status: DC | PRN
  Administered 2021-11-08: 4 mg via INTRAVENOUS

## 2021-11-08 MED ORDER — CHLORHEXIDINE GLUCONATE 0.12 % MT SOLN
OROMUCOSAL | Status: AC
  Administered 2021-11-08: 15 mL via OROMUCOSAL
  Filled 2021-11-08: qty 15

## 2021-11-08 MED ORDER — DEXMEDETOMIDINE (PRECEDEX) IN NS 20 MCG/5ML (4 MCG/ML) IV SYRINGE
PREFILLED_SYRINGE | INTRAVENOUS | Status: DC | PRN
  Administered 2021-11-08: 4 ug via INTRAVENOUS

## 2021-11-08 MED ORDER — CHLORHEXIDINE GLUCONATE 0.12 % MT SOLN: 15.0000 mL | Freq: Once | OROMUCOSAL | Status: AC

## 2021-11-08 MED ORDER — CEFAZOLIN SODIUM-DEXTROSE 1-4 GM/50ML-% IV SOLN
1.0000 g | Freq: Once | INTRAVENOUS | Status: AC
  Administered 2021-11-08: 1 g via INTRAVENOUS

## 2021-11-08 MED ORDER — LACTATED RINGERS IV SOLN
INTRAVENOUS | Status: DC
Start: 2021-11-08 — End: 2021-11-08

## 2021-11-08 MED ORDER — FAMOTIDINE 20 MG PO TABS
ORAL_TABLET | ORAL | Status: AC
  Administered 2021-11-08: 20 mg via ORAL
  Filled 2021-11-08: qty 1

## 2021-11-08 MED ORDER — FAMOTIDINE 20 MG PO TABS: 20.0000 mg | ORAL_TABLET | Freq: Once | ORAL | Status: AC

## 2021-11-08 MED ORDER — GENTAMICIN IN SALINE 1.6-0.9 MG/ML-% IV SOLN
80.0000 mg | Freq: Once | INTRAVENOUS | Status: AC
  Administered 2021-11-08: 80 mg via INTRAVENOUS
  Filled 2021-11-08: qty 50

## 2021-11-08 SURGICAL SUPPLY — 16 items
COVER MAYO STAND REUSABLE (DRAPES) ×2 IMPLANT
COVER TRANSDUCER UNLTRASOUND (MISCELLANEOUS) ×1 IMPLANT
CUP MEDICINE 2OZ PLAST GRAD ST (MISCELLANEOUS) ×2 IMPLANT
GLOVE BIOGEL M STRL SZ7.5 (GLOVE) ×2 IMPLANT
GUIDE NDL URONAV ULTRASND S (MISCELLANEOUS) IMPLANT
GUIDE NEEDLE URONAV ULTRASND S (MISCELLANEOUS) ×1 IMPLANT
INST BIOPSY MAXCORE 18GX25 (NEEDLE) ×2 IMPLANT
PROBE URONAV BK 8808E 8818 HLD (MISCELLANEOUS) IMPLANT
STRAP SAFETY 5IN WIDE (MISCELLANEOUS) ×2 IMPLANT
SURGILUBE 2OZ TUBE FLIPTOP (MISCELLANEOUS) ×2 IMPLANT
TOWEL OR 17X26 4PK STRL BLUE (TOWEL DISPOSABLE) ×2 IMPLANT
URONAV BK 8808E 8818 PROBE HLD (MISCELLANEOUS) ×2
URONAV MRI FUSION TWO PATIENTS (MISCELLANEOUS) ×1 IMPLANT
URONAV ULTRASOUND (MISCELLANEOUS) ×1 IMPLANT
URONAV ULTRASOUND NDL GUIDE S (MISCELLANEOUS) ×2
WATER STERILE IRR 500ML POUR (IV SOLUTION) ×2 IMPLANT

## 2021-11-08 NOTE — Transfer of Care (Signed)
Immediate Anesthesia Transfer of Care Note  Patient: Robert Patton  Procedure(s) Performed: PROSTATE BIOPSY URONAV (Prostate)  Patient Location: PACU  Anesthesia Type:General  Level of Consciousness: drowsy  Airway & Oxygen Therapy: Patient Spontanous Breathing and Patient connected to face mask oxygen  Post-op Assessment: Report given to RN and Post -op Vital signs reviewed and stable  Post vital signs: Reviewed and stable  Last Vitals:  Vitals Value Taken Time  BP 94/53 11/08/21 0802  Temp    Pulse 57 11/08/21 0802  Resp 10 11/08/21 0802  SpO2 100 % 11/08/21 0802  Vitals shown include unvalidated device data.  Last Pain:  Vitals:   11/08/21 0630  TempSrc: Temporal  PainSc: 0-No pain      Patients Stated Pain Goal: 0 (38/88/28 0034)  Complications: No notable events documented.

## 2021-11-08 NOTE — Anesthesia Preprocedure Evaluation (Signed)
Anesthesia Evaluation  Patient identified by MRN, date of birth, ID band Patient awake    Reviewed: Allergy & Precautions, NPO status , Patient's Chart, lab work & pertinent test results  History of Anesthesia Complications Negative for: history of anesthetic complications  Airway Mallampati: III  TM Distance: >3 FB Neck ROM: full    Dental  (+) Chipped   Pulmonary neg pulmonary ROS, neg shortness of breath,    Pulmonary exam normal        Cardiovascular Exercise Tolerance: Good hypertension, (-) angina(-) Past MI and (-) DOE Normal cardiovascular exam     Neuro/Psych PSYCHIATRIC DISORDERS negative neurological ROS     GI/Hepatic negative GI ROS, Neg liver ROS,   Endo/Other  negative endocrine ROS  Renal/GU negative Renal ROS  negative genitourinary   Musculoskeletal   Abdominal   Peds  Hematology negative hematology ROS (+)   Anesthesia Other Findings Past Medical History: No date: Arthritis No date: ED (erectile dysfunction) No date: Hyperlipidemia No date: Hypertension  Past Surgical History: 08/15/2009: COLONOSCOPY 10/08/2019: COLONOSCOPY WITH PROPOFOL; N/A     Comment:  Procedure: COLONOSCOPY WITH PROPOFOL;  Surgeon: Lin Landsman, MD;  Location: [facility] ENDOSCOPY;  Service:               Gastroenterology;  Laterality: N/A; 2016: EYE SURGERY; Right     Comment:  CATARACT EXTRACTION 07/22/2017: INGUINAL HERNIA REPAIR; Right     Comment:  Procedure: HERNIA REPAIR INGUINAL ADULT;  Surgeon:               Royston Cowper, MD;  Location: [facility] ORS;  Service:               Urology;  Laterality: Right;  BMI    Body Mass Index: 24.67 kg/m      Reproductive/Obstetrics negative OB ROS                             Anesthesia Physical Anesthesia Plan  ASA: 2  Anesthesia Plan: General   Post-op Pain Management:    Induction: Intravenous  PONV Risk Score and  Plan: Propofol infusion and TIVA  Airway Management Planned: Natural Airway and Nasal Cannula  Additional Equipment:   Intra-op Plan:   Post-operative Plan:   Informed Consent: I have reviewed the patients History and Physical, chart, labs and discussed the procedure including the risks, benefits and alternatives for the proposed anesthesia with the patient or authorized representative who has indicated his/her understanding and acceptance.     Dental Advisory Given  Plan Discussed with: Anesthesiologist, CRNA and Surgeon  Anesthesia Plan Comments: (Patient consented for risks of anesthesia including but not limited to:  - adverse reactions to medications - risk of airway placement if required - damage to eyes, teeth, lips or other oral mucosa - nerve damage due to positioning  - sore throat or hoarseness - Damage to heart, brain, nerves, lungs, other parts of body or loss of life  Patient voiced understanding.)        Anesthesia Quick Evaluation

## 2021-11-08 NOTE — Discharge Instructions (Addendum)
Transrectal Ultrasound-Guided Prostate Biopsy, Care After What can I expect after the procedure? After the procedure, it is common to have: Pain and discomfort near your butt (rectum), especially while sitting. Pink-colored pee (urine). This is due to small amounts of blood in your pee. A burning feeling while peeing. Blood in your poop (stool). Bleeding from your butt. Blood in your semen. Follow these instructions at home: Medicines Take over-the-counter and prescription medicines only as told by your doctor. If you were given a sedative during your procedure, do not drive or use machines until your doctor says that it is safe. A sedative is a medicine that helps you relax. If you were prescribed an antibiotic medicine, take it as told by your doctor. Do not stop taking it even if you start to feel better. Activity A sign showing that a person should not lift anything heavy.   Return to your normal activities when your doctor says that it is safe. Ask your doctor when it is okay for you to have sex. You may have to avoid lifting. Ask your doctor how much you can safely lift. General instructions A comparison of three sample cups showing dark yellow, yellow, and pale yellow urine.   Drink enough water to keep your pee pale yellow. Watch your pee, poop, and semen for new bleeding or bleeding that gets worse. Keep all follow-up visits. Contact a doctor if: You have any of these: Blood clots in your pee or poop. Blood in your pee more than 2 weeks after the procedure. Blood in your semen more than 2 months after the procedure. New or worse bleeding in your pee, poop, or semen. Very bad belly pain. Your pee smells bad or unusual. You have trouble peeing. Your lower belly feels firm. You have problems getting an erection. You feel like you may vomit (are nauseous), or you vomit. Get help right away if: You have a fever or chills. You have bright red pee. You have very bad pain  that does not get better with medicine. You cannot pee. Summary After this procedure, it is common to have pain and discomfort near your butt, especially while sitting. You may have blood in your pee and poop. It is common to have blood in your semen. Get help right away if you have a fever or chills. This information is not intended to replace advice given to you by your health care provider. Make sure you discuss any questions you have with your health care provider. Document Revised: 11/06/2020 Document Reviewed: 11/06/2020 Elsevier Patient Education  2023 Elsevier Inc.    AMBULATORY SURGERY  DISCHARGE INSTRUCTIONS   The drugs that you were given will stay in your system until tomorrow so for the next 24 hours you should not:  Drive an automobile Make any legal decisions Drink any alcoholic beverage   You may resume regular meals tomorrow.  Today it is better to start with liquids and gradually work up to solid foods.  You may eat anything you prefer, but it is better to start with liquids, then soup and crackers, and gradually work up to solid foods.   Please notify your doctor immediately if you have any unusual bleeding, trouble breathing, redness and pain at the surgery site, drainage, fever, or pain not relieved by medication.    Additional Instructions:        Please contact your physician with any problems or Same Day Surgery at [phone], Monday through Friday 6 am to 4 pm, or   [place] number at [phone].    

## 2021-11-08 NOTE — H&P (Signed)
Date of Initial H&P: 11/02/21   History reviewed, patient examined, no change in status, stable for surgery.

## 2021-11-08 NOTE — Op Note (Signed)
Preoperative diagnosis: 1.  Elevated PSA (R97.2)                                           2.  PI-RADS category 3 lesion of the left side of the prostate (N40.3, R93.8)  Postoperative diagnosis: Same  Procedure: UroNav fusion biopsy of the prostate (CPT 747 [id number], 43154)  Surgeon: Otelia Limes. Yves Dill MD  Anesthesia: General  Indications:See the history and physical also.  71 year old (DATE OF BIRTH: 11/29/1950) African-American male with an elevated PSA of 9.4 ng/mL on 08/13/2021.  He was further evaluated with an Exosome score that was elevated at 73.13.  MRI scan dated 08/13/2021 indicated a 79.9 mL prostate with a 0.29 mL PI-RADS category 3 lesion on the left side.  He comes in now for a UroNav fusion biopsy of the prostate gland.After informed consent the above procedure(s) were requested.      Technique and findings: After adequate general anesthesia been obtained patient was placed into left lateral decubitus position and DRE was performed.  The rectal vault was clear.  There were no palpable nodules.  The ultrasound probe was placed and images acquired.  Ultrasound images were then fused with the MRI images.  The region of interest was identified and 3 core biopsies taken here.  At this point standard 12 core systematic biopsies were performed.  The ultrasound probe was removed.  Blood loss was minimal.  The procedure was then terminated and patient transferred to the recovery room in stable condition.

## 2021-11-08 NOTE — Anesthesia Postprocedure Evaluation (Signed)
Anesthesia Post Note  Patient: Robert Patton  Procedure(s) Performed: PROSTATE BIOPSY URONAV (Prostate)  Patient location during evaluation: PACU Anesthesia Type: General Level of consciousness: awake and alert Pain management: pain level controlled Vital Signs Assessment: post-procedure vital signs reviewed and stable Respiratory status: spontaneous breathing, nonlabored ventilation, respiratory function stable and patient connected to nasal cannula oxygen Cardiovascular status: blood pressure returned to baseline and stable Postop Assessment: no apparent nausea or vomiting Anesthetic complications: no   No notable events documented.   Last Vitals:  Vitals:   11/08/21 0903 11/08/21 0907  BP: (!) 152/78 (!) 150/75  Pulse: (!) 57 (!) 57  Resp: 18 18  Temp: (!) 35.8 C   SpO2: 95% 95%    Last Pain:  Vitals:   11/08/21 0907  TempSrc:   PainSc: 0-No pain                 Precious Haws Rakeen Gaillard

## 2021-11-09 LAB — SURGICAL PATHOLOGY

## 2021-11-13 ENCOUNTER — Encounter: Payer: Self-pay | Admitting: Urology

## 2021-11-14 DIAGNOSIS — N433 Hydrocele, unspecified: Secondary | ICD-10-CM | POA: Diagnosis not present

## 2021-11-14 DIAGNOSIS — R972 Elevated prostate specific antigen [PSA]: Secondary | ICD-10-CM | POA: Diagnosis not present

## 2021-11-14 DIAGNOSIS — N5201 Erectile dysfunction due to arterial insufficiency: Secondary | ICD-10-CM | POA: Diagnosis not present

## 2021-11-23 ENCOUNTER — Encounter: Payer: Self-pay | Admitting: Urology

## 2021-11-25 ENCOUNTER — Other Ambulatory Visit: Payer: Self-pay | Admitting: Family Medicine

## 2021-11-25 DIAGNOSIS — I10 Essential (primary) hypertension: Secondary | ICD-10-CM

## 2021-12-12 ENCOUNTER — Telehealth: Payer: Self-pay

## 2021-12-12 NOTE — Telephone Encounter (Signed)
Advised last TDAP was 04/08/2011.

## 2021-12-12 NOTE — Telephone Encounter (Signed)
Copied from [place] [id number]. Topic: General - Other >> Dec 12, 2021 12:35 PM Charlotte Sanes J wrote: Reason for CRM: Pt needs to know when his last tetnus shot was/ please advise

## 2022-01-30 ENCOUNTER — Ambulatory Visit (INDEPENDENT_AMBULATORY_CARE_PROVIDER_SITE_OTHER): Payer: Commercial Managed Care - PPO | Admitting: Family Medicine

## 2022-01-30 ENCOUNTER — Encounter: Payer: Self-pay | Admitting: Family Medicine

## 2022-01-30 VITALS — BP 162/82 | HR 72 | Resp 16 | Ht 70.0 in | Wt 169.0 lb

## 2022-01-30 DIAGNOSIS — R972 Elevated prostate specific antigen [PSA]: Secondary | ICD-10-CM

## 2022-01-30 DIAGNOSIS — I10 Essential (primary) hypertension: Secondary | ICD-10-CM

## 2022-01-30 DIAGNOSIS — I872 Venous insufficiency (chronic) (peripheral): Secondary | ICD-10-CM | POA: Diagnosis not present

## 2022-01-30 DIAGNOSIS — E782 Mixed hyperlipidemia: Secondary | ICD-10-CM | POA: Diagnosis not present

## 2022-01-30 DIAGNOSIS — Z0000 Encounter for general adult medical examination without abnormal findings: Secondary | ICD-10-CM | POA: Diagnosis not present

## 2022-01-30 DIAGNOSIS — Z1211 Encounter for screening for malignant neoplasm of colon: Secondary | ICD-10-CM | POA: Diagnosis not present

## 2022-01-30 LAB — IFOBT (OCCULT BLOOD): IFOBT: NEGATIVE

## 2022-01-30 NOTE — Progress Notes (Signed)
Complete physical exam  I,Robert Patton,acting as a scribe for Robert Durie, MD.,have documented all relevant documentation on the behalf of Robert Durie, MD,as directed by  Robert Durie, MD while in the presence of Robert Durie, MD.   Patient: Robert Patton   DOB: 07/11/1950   71 y.o. Male  MRN: [id number] Visit Date: 01/30/2022  Today's healthcare provider: [name], MD   Chief Complaint  Patient presents with   Annual Exam   Subjective    Robert Patton is a 71 y.o. male who presents today for a complete physical exam.  Annual physical. He reports consuming a general diet. Home exercise routine includes bike and walking. He generally feels fairly well. He reports sleeping fairly well. He does not have additional problems to discuss today.  He is married and continues to work,his wife is retired. His home blood pressure reading this morning was 134/77. HPI    Past Medical History:  Diagnosis Date   Arthritis    ED (erectile dysfunction)    Hyperlipidemia    Hypertension    Past Surgical History:  Procedure Laterality Date   COLONOSCOPY  08/15/2009   COLONOSCOPY WITH PROPOFOL N/A 10/08/2019   Procedure: COLONOSCOPY WITH PROPOFOL;  Surgeon: Lin Landsman, MD;  Location: [facility] END[facility]COPY;  Service: Gastroenterology;  Laterality: N/A;   EYE SURGERY Right 2016   CATARACT EXTRACTION   INGUINAL HERNIA REPAIR Right 07/22/2017   Procedure: HERNIA REPAIR INGUINAL ADULT;  Surgeon: Royston Cowper, MD;  Location: [facility] ORS;  Service: Urology;  Laterality: Right;   PROSTATE BIOPSY N/A 11/08/2021   Procedure: PROSTATE BIOPSY Vernelle Emerald;  Surgeon: Royston Cowper, MD;  Location: [facility] ORS;  Service: Urology;  Laterality: N/A;   Social History   Socioeconomic History   Marital status: Married    Spouse name: Not on file   Number of children: 3   Years of education: Not on file   Highest education level: Bachelor's degree (e.g., BA, AB, BS)   Occupational History   Occupation: CNA  Tobacco Use   Smoking status: Never   Smokeless tobacco: Never  Vaping Use   Vaping Use: Never used  Substance and Sexual Activity   Alcohol use: No   Drug use: No   Sexual activity: Not on file  Other Topics Concern   Not on file  Social History Narrative   Not on file   Social Determinants of Health   Financial Resource Strain: Low Risk  (06/26/2020)   Overall Financial Resource Strain (CARDIA)    Difficulty of Paying Living Expenses: Not hard at all  Food Insecurity: No Food Insecurity (06/26/2020)   Hunger Vital Sign    Worried About Running Out of Food in the Last Year: Never true    Ran Out of Food in the Last Year: Never true  Transportation Needs: No Transportation Needs (06/26/2020)   PRAPARE - Hydrologist (Medical): No    Lack of Transportation (Non-Medical): No  Physical Activity: Insufficiently Active (06/26/2020)   Exercise Vital Sign    Days of Exercise per Week: 7 days    Minutes of Exercise per Session: 20 min  Stress: No Stress Concern Present (06/26/2020)   [place]    Feeling of Stress : Not at all  Social Connections: [place] (06/26/2020)   Social Connection and Isolation Panel [NHANES]    Frequency of Communication with  Friends and Family: More than three times a week    Frequency of Social Gatherings with Friends and Family: More than three times a week    Attends Religious Services: More than 4 times per year    Active Member of Clubs or Organizations: Yes    Attends Archivist Meetings: More than 4 times per year    Marital Status: Married  Human resources officer Violence: Not At Risk (06/26/2020)   Humiliation, Afraid, Rape, and Kick questionnaire    Fear of Current or Ex-Partner: No    Emotionally Abused: No    Physically Abused: No    Sexually Abused: No   Family Status  Relation Name Status    Mother  Deceased at age 51   Father  Deceased at age 80   Sister  Alive   Brother  Alive   Sister  Alive   Sister  [place]   Family History  Problem Relation Age of Onset   Hypertension Brother    No Known Allergies  Patient Care Team: Jerrol Banana., MD as PCP - General (Family Medicine) Lin Landsman, MD as Consulting Physician (Gastroenterology) Royston Cowper, MD as Consulting Physician (Urology)   Medications: Outpatient Medications Prior to Visit  Medication Sig   acetaminophen (TYLENOL) 500 MG tablet Take 1,000 mg by mouth every 6 (six) hours as needed (FOR PAIN.).   amLODipine (NORVASC) 5 MG tablet TAKE 1 TABLET(5 MG) BY MOUTH DAILY   Calcium-Magnesium-Zinc (CAL-MAG-ZINC PO) Take 1 tablet by mouth daily.   carboxymethylcellulose 1 % ophthalmic solution Place 1-2 drops into both eyes 2 (two) times daily.   chlorthalidone (HYGROTON) 50 MG tablet TAKE 1 TABLET(50 MG) BY MOUTH EVERY MORNING   cyclobenzaprine (FLEXERIL) 5 MG tablet Take 1-2 tablets (5-10 mg total) by mouth every evening.   gabapentin (NEURONTIN) 100 MG capsule Take 100 mg by mouth 2 (two) times daily.   levofloxacin (LEVAQUIN) 500 MG tablet Take 1 tablet (500 mg total) by mouth daily.   losartan (COZAAR) 100 MG tablet Take 0.5 tablets (50 mg total) by mouth daily.   Multiple Vitamin (MULTIVITAMIN WITH MINERALS) TABS tablet Take 1 tablet by mouth daily.   tadalafil (CIALIS) 5 MG tablet Take 5 mg by mouth daily.   traMADol (ULTRAM) 50 MG tablet Take 50 mg by mouth every 6 (six) hours as needed.   Vitamin D, Ergocalciferol, (DRISDOL) 1.25 MG (50000 UNIT) CAPS capsule TAKE 1 CAPSULE BY MOUTH EVERY 7 DAYS   [DISCONTINUED] Ascorbic Acid (VITAMIN C) 1000 MG tablet Take 1,000 mg by mouth daily. (Patient not taking: Reported on 01/30/2022)   [DISCONTINUED] benzonatate (TESSALON) 200 MG capsule Take 1 capsule (200 mg total) by mouth 2 (two) times daily as needed for cough. (Patient not  taking: Reported on 01/30/2022)   No facility-administered medications prior to visit.    Review of Systems  Musculoskeletal:  Positive for back pain and myalgias.  All other systems reviewed and are negative.      Objective     BP (!) 162/82 (BP Location: Left Arm, Patient Position: Sitting, Cuff Size: Normal)   Pulse 72   Resp 16   Ht '5\' 10"'$  (1.778 m)   Wt 169 lb (76.7 kg)   SpO2 98%   BMI 24.25 kg/m  BP Readings from Last 3 Encounters:  01/30/22 (!) 162/82  11/08/21 (!) 150/75  10/16/21 (!) 162/78   Wt Readings from Last 3 Encounters:  01/30/22 169 lb (  76.7 kg)  11/08/21 171 lb 15.3 oz (78 kg)  10/30/21 172 lb (78 kg)       Physical Exam Vitals reviewed.  Constitutional:      Appearance: He is well-developed.  HENT:     Head: Normocephalic and atraumatic.  Eyes:     General: No scleral icterus.    Conjunctiva/sclera: Conjunctivae normal.  Neck:     Thyroid: No thyromegaly.  Cardiovascular:     Rate and Rhythm: Normal rate and regular rhythm.     Heart sounds: Normal heart sounds.  Pulmonary:     Effort: Pulmonary effort is normal.     Breath sounds: Normal breath sounds.  Abdominal:     Palpations: Abdomen is soft.  Musculoskeletal:     Cervical back: Neck supple.  Lymphadenopathy:     Cervical: No cervical adenopathy.  Skin:    General: Skin is warm and dry.  Neurological:     General: No focal deficit present.     Mental Status: He is alert and oriented to person, place, and time.  Psychiatric:        Mood and Affect: Mood normal.        Behavior: Behavior normal.        Thought Content: Thought content normal.        Judgment: Judgment normal.       Last depression screening scores    01/30/2022   10:39 AM 07/25/2021    8:48 AM 05/10/2021    9:00 AM  PHQ 2/9 Scores  PHQ - 2 Score 0 0 0  PHQ- 9 Score 0 0 0   Last fall risk screening    01/30/2022   10:39 AM  Fall Risk   Falls in the past year? 0  Number falls in past yr: 0  Injury  with Fall? 0  Risk for fall due to : No Fall Risks  Follow up Falls evaluation completed   Last Audit-C alcohol use screening    01/30/2022   10:39 AM  Alcohol Use Disorder Test (AUDIT)  1. How often do you have a drink containing alcohol? 0  2. How many drinks containing alcohol do you have on a typical day when you are drinking? 0  3. How often do you have six or more drinks on one occasion? 0  AUDIT-C Score 0   A score of 3 or more in women, and 4 or more in men indicates increased risk for alcohol abuse, EXCEPT if all of the points are from question 1   No results found for any visits on 01/30/22.  Assessment & Plan    Routine Health Maintenance and Physical Exam  Exercise Activities and Dietary recommendations  Goals   None     Immunization History  Administered Date(s) Administered   Fluad Quad(high Dose 65+) 03/11/2019, 03/14/2020   Influenza Split 04/08/2011   Influenza, High Dose Seasonal PF 02/19/2018   Influenza-Unspecified 02/12/2021   Moderna SARS-COV2 Booster Vaccination 02/12/2021   Moderna Sars-Covid-2 Vaccination 07/05/2019, 08/02/2019   Pneumococcal Conjugate-13 07/20/2014   Pneumococcal Polysaccharide-23 02/19/2018   Tdap 04/08/2011   Zoster, Live 12/24/2011    Health Maintenance  Topic Date Due   Zoster Vaccines- Shingrix (1 of 2) Never done   TETANUS/TDAP  04/07/2021   COVID-19 Vaccine (3 - Moderna series) 04/09/2021   INFLUENZA VACCINE  12/25/2021   COLONOSCOPY (Pts 45-79yr Insurance coverage will need to be confirmed)  10/07/2029   Pneumonia Vaccine 71 Years old  Completed  Hepatitis C Screening  Completed   HPV VACCINES  Aged Out    Discussed health benefits of physical activity, and encouraged him to engage in regular exercise appropriate for his age and condition.  1. Annual physical exam Overall good health. F/u with Dr Quentin Cornwall in 4-6 months for BP He declines Prevnar20 but is interested to Dtap and shingrex. - Lipid panel - CBC  w/Diff/Platelet - Comprehensive Metabolic Panel (CMET) - TSH  2. Essential (primary) hypertension White coat HTN - Lipid panel - CBC w/Diff/Platelet - Comprehensive Metabolic Panel (CMET) - TSH  3. Mixed hyperlipidemia  - Lipid panel - CBC w/Diff/Platelet - Comprehensive Metabolic Panel (CMET) - TSH  4. Venous insufficiency, peripheral  - Lipid panel - CBC w/Diff/Platelet - Comprehensive Metabolic Panel (CMET) - TSH  5. Elevated PSA  - PSA  6. Encounter for screening fecal occult blood testing  - IFOBT POC (occult bld, rslt in office); Negative    Return in about 6 months (around 07/31/2022).     I, Robert Durie, MD, have reviewed all documentation for this visit. The documentation on 02/02/22 for the exam, diagnosis, procedures, and orders are all accurate and complete.    Clatie Kessen Cranford Mon, MD  [facility] [place] [phone] (phone) [phone] (fax)  [place]

## 2022-01-30 NOTE — Patient Instructions (Signed)
CALL HEALTH DEPARTMENT CONCERNING TETANUS VACCINE.

## 2022-01-31 LAB — LIPID PANEL
Chol/HDL Ratio: 2.3 ratio (ref 0.0–5.0)
Cholesterol, Total: 185 mg/dL (ref 100–199)
HDL: 80 mg/dL (ref >39)
LDL Chol Calc (NIH): 95 mg/dL (ref 0–99)
Triglycerides: 52 mg/dL (ref 0–149)
VLDL Cholesterol Cal: 10 mg/dL (ref 5–40)

## 2022-01-31 LAB — COMPREHENSIVE METABOLIC PANEL
ALT: 18 IU/L (ref 0–44)
AST: 19 IU/L (ref 0–40)
Albumin/Globulin Ratio: 1.4 (ref 1.2–2.2)
Albumin: 4.2 g/dL (ref 3.8–4.8)
Alkaline Phosphatase: 89 IU/L (ref 44–121)
BUN/Creatinine Ratio: 21 (ref 10–24)
BUN: 19 mg/dL (ref 8–27)
Bilirubin Total: 0.4 mg/dL (ref 0.0–1.2)
CO2: 25 mmol/L (ref 20–29)
Calcium: 9.5 mg/dL (ref 8.6–10.2)
Chloride: 101 mmol/L (ref 96–106)
Creatinine, Ser: 0.9 mg/dL (ref 0.76–1.27)
Globulin, Total: 3.1 g/dL (ref 1.5–4.5)
Glucose: 83 mg/dL (ref 70–99)
Potassium: 3.9 mmol/L (ref 3.5–5.2)
Sodium: 139 mmol/L (ref 134–144)
Total Protein: 7.3 g/dL (ref 6.0–8.5)
eGFR: 91 mL/min/{1.73_m2} (ref >59)

## 2022-01-31 LAB — CBC WITH DIFFERENTIAL/PLATELET
Basophils Absolute: 0 10*3/uL (ref 0.0–0.2)
Basos: 1 %
EOS (ABSOLUTE): 0.2 10*3/uL (ref 0.0–0.4)
Eos: 4 %
Hematocrit: 42.7 % (ref 37.5–51.0)
Hemoglobin: 14 g/dL (ref 13.0–17.7)
Immature Grans (Abs): 0 10*3/uL (ref 0.0–0.1)
Immature Granulocytes: 0 %
Lymphocytes Absolute: 1.8 10*3/uL (ref 0.7–3.1)
Lymphs: 42 %
MCH: 28.6 pg (ref 26.6–33.0)
MCHC: 32.8 g/dL (ref 31.5–35.7)
MCV: 87 fL (ref 79–97)
Monocytes Absolute: 0.4 10*3/uL (ref 0.1–0.9)
Monocytes: 10 %
Neutrophils Absolute: 1.8 10*3/uL (ref 1.4–7.0)
Neutrophils: 43 %
Platelets: 185 10*3/uL (ref 150–450)
RBC: 4.89 x10E6/uL (ref 4.14–5.80)
RDW: 13.5 % (ref 11.6–15.4)
WBC: 4.1 10*3/uL (ref 3.4–10.8)

## 2022-01-31 LAB — PSA: Prostate Specific Ag, Serum: 5 ng/mL — ABNORMAL HIGH (ref 0.0–4.0)

## 2022-01-31 LAB — TSH: TSH: 1.85 u[IU]/mL (ref 0.450–4.500)

## 2022-02-04 ENCOUNTER — Telehealth: Payer: Self-pay | Admitting: Family Medicine

## 2022-02-04 ENCOUNTER — Ambulatory Visit: Payer: Commercial Managed Care - PPO | Admitting: Family Medicine

## 2022-02-04 NOTE — Telephone Encounter (Signed)
Pt is calling to request a copy of his cpe results- labs and all. Pt is able to come pick up. Please advise  Cb-  [phone]

## 2022-02-06 NOTE — Telephone Encounter (Signed)
Opened in error

## 2022-02-11 ENCOUNTER — Telehealth: Payer: Self-pay | Admitting: Family Medicine

## 2022-02-11 NOTE — Telephone Encounter (Signed)
Copied from [place] [id number]. Topic: Medicare AWV >> Feb 11, 2022  8:47 AM Jae Dire wrote: Reason for CRM:  Left message for patient to call back and schedule Medicare Annual Wellness Visit (AWV) in office.   If unable to come into the office for AWV,  please offer to do virtually or by telephone.  Last AWV: 06/26/2020  Please schedule at anytime with [facility] Health Advisor.  30 minute appointment for Virtual or phone 45 minute appointment for in office or Initial virtual/phone  Any questions, please contact me at [phone]

## 2022-02-27 ENCOUNTER — Telehealth: Payer: Self-pay

## 2022-02-27 NOTE — Telephone Encounter (Signed)
Copied from [place] [id number]. Topic: General - Other >> Feb 27, 2022 10:02 AM MVHQIONG J wrote: Reason for CRM: pt says that he was a previous pt of Dr. Caryn Section, pt would like to know if he can switch back to provider due to [facility] leaving? Advised pt that [facility] isn't accepting pt. Requested that I ask anyway. Pt says that he prefer to have a male PCP.     CB: [phone] - please assist pt further.

## 2022-03-18 ENCOUNTER — Ambulatory Visit (INDEPENDENT_AMBULATORY_CARE_PROVIDER_SITE_OTHER): Payer: Commercial Managed Care - PPO

## 2022-03-18 VITALS — Ht 70.0 in | Wt 169.0 lb

## 2022-03-18 DIAGNOSIS — Z0000 Encounter for general adult medical examination without abnormal findings: Secondary | ICD-10-CM | POA: Diagnosis not present

## 2022-03-18 NOTE — Progress Notes (Signed)
Virtual Visit via Telephone Note  I connected with  Robert Patton on 03/18/22 at 10:00 AM EDT by telephone and verified that I am speaking with the correct person using two identifiers.  Location: Patient: home Provider: [facility] Persons participating in the virtual visit: Angelica   I discussed the limitations, risks, security and privacy concerns of performing an evaluation and management service by telephone and the availability of in person appointments. The patient expressed understanding and agreed to proceed.  Interactive audio and video telecommunications were attempted between this nurse and patient, however failed, due to patient having technical difficulties OR patient did not have access to video capability.  We continued and completed visit with audio only.  Some vital signs may be absent or patient reported.   Dionisio David, LPN  Subjective:   [name] is a 71 y.o. male who presents for Medicare Annual/Subsequent preventive examination.  Review of Systems     Cardiac Risk Factors include: advanced age (>34mn, >>68women);hypertension;male gender     Objective:    Today's Vitals   03/18/22 1001  PainSc: 4    There is no height or weight on file to calculate BMI.     03/18/2022   10:07 AM 11/08/2021    6:25 AM 10/30/2021   10:12 AM 06/26/2020    8:42 AM 10/08/2019    8:29 AM 07/21/2017    8:00 AM 02/04/2017    8:41 AM  Advanced Directives  Does Patient Have a Medical Advance Directive? No No No No No No No  Would patient like information on creating a medical advance directive? No - Patient declined No - Patient declined  No - Patient declined  No - Patient declined     Current Medications (verified) Outpatient Encounter Medications as of 03/18/2022  Medication Sig   acetaminophen (TYLENOL) 500 MG tablet Take 1,000 mg by mouth every 6 (six) hours as needed (FOR PAIN.).   amLODipine (NORVASC) 5 MG tablet TAKE 1 TABLET(5 MG) BY MOUTH DAILY    Calcium-Magnesium-Zinc (CAL-MAG-ZINC PO) Take 1 tablet by mouth daily.   carboxymethylcellulose 1 % ophthalmic solution Place 1-2 drops into both eyes 2 (two) times daily.   chlorthalidone (HYGROTON) 50 MG tablet TAKE 1 TABLET(50 MG) BY MOUTH EVERY MORNING   cyclobenzaprine (FLEXERIL) 5 MG tablet Take 1-2 tablets (5-10 mg total) by mouth every evening.   gabapentin (NEURONTIN) 100 MG capsule Take 100 mg by mouth 2 (two) times daily.   gabapentin (NEURONTIN) 400 MG capsule TAKE 1 CAPSULE(400 MG) BY MOUTH TWICE DAILY   losartan (COZAAR) 100 MG tablet Take 0.5 tablets (50 mg total) by mouth daily.   Multiple Vitamin (MULTIVITAMIN WITH MINERALS) TABS tablet Take 1 tablet by mouth daily.   tadalafil (CIALIS) 5 MG tablet Take 5 mg by mouth daily.   traMADol (ULTRAM) 50 MG tablet Take 50 mg by mouth every 6 (six) hours as needed.   Vitamin D, Ergocalciferol, (DRISDOL) 1.25 MG (50000 UNIT) CAPS capsule TAKE 1 CAPSULE BY MOUTH EVERY 7 DAYS   levofloxacin (LEVAQUIN) 500 MG tablet Take 1 tablet (500 mg total) by mouth daily. (Patient not taking: Reported on 03/18/2022)   No facility-administered encounter medications on file as of 03/18/2022.    Allergies (verified) Patient has no known allergies.   History: Past Medical History:  Diagnosis Date   Arthritis    ED (erectile dysfunction)    Hyperlipidemia    Hypertension    Past Surgical History:  Procedure Laterality  Date   COLONOSCOPY  08/15/2009   COLONOSCOPY WITH PROPOFOL N/A 10/08/2019   Procedure: COLONOSCOPY WITH PROPOFOL;  Surgeon: Lin Landsman, MD;  Location: [facility] ENDOSCOPY;  Service: Gastroenterology;  Laterality: N/A;   EYE SURGERY Right 2016   CATARACT EXTRACTION   INGUINAL HERNIA REPAIR Right 07/22/2017   Procedure: HERNIA REPAIR INGUINAL ADULT;  Surgeon: Royston Cowper, MD;  Location: [facility] ORS;  Service: Urology;  Laterality: Right;   PROSTATE BIOPSY N/A 11/08/2021   Procedure: PROSTATE BIOPSY Vernelle Emerald;  Surgeon: Royston Cowper, MD;  Location: [facility] ORS;  Service: Urology;  Laterality: N/A;   Family History  Problem Relation Age of Onset   Hypertension Brother    Social History   Socioeconomic History   Marital status: Married    Spouse name: Not on file   Number of children: 3   Years of education: Not on file   Highest education level: Bachelor's degree (e.g., BA, AB, BS)  Occupational History   Occupation: CNA  Tobacco Use   Smoking status: Never   Smokeless tobacco: Never  Vaping Use   Vaping Use: Never used  Substance and Sexual Activity   Alcohol use: No   Drug use: No   Sexual activity: Not on file  Other Topics Concern   Not on file  Social History Narrative   Not on file   Social Determinants of Health   Financial Resource Strain: Low Risk  (03/18/2022)   Overall Financial Resource Strain (CARDIA)    Difficulty of Paying Living Expenses: Not hard at all  Food Insecurity: No Food Insecurity (03/18/2022)   Hunger Vital Sign    Worried About Running Out of Food in the Last Year: Never true    Ran Out of Food in the Last Year: Never true  Transportation Needs: No Transportation Needs (03/18/2022)   PRAPARE - Hydrologist (Medical): No    Lack of Transportation (Non-Medical): No  Physical Activity: Sufficiently Active (03/18/2022)   Exercise Vital Sign    Days of Exercise per Week: 5 days    Minutes of Exercise per Session: 30 min  Stress: No Stress Concern Present (03/18/2022)   [place]    Feeling of Stress : Not at all  Social Connections: Moderately Integrated (03/18/2022)   Social Connection and Isolation Panel [NHANES]    Frequency of Communication with Friends and Family: More than three times a week    Frequency of Social Gatherings with Friends and Family: More than three times a week    Attends Religious Services: More than 4 times per year    Active Member of [employer] or  Organizations: No    Attends Music therapist: Never    Marital Status: Married    Tobacco Counseling Counseling given: Not Answered   Clinical Intake:  Pre-visit preparation completed: Yes  Pain : 0-10 Pain Score: 4  Pain Type: Chronic pain Pain Location: Leg Pain Orientation: Left     Nutritional Risks: None Diabetes: No  How often do you need to have someone help you when you read instructions, pamphlets, or other written materials from your doctor or pharmacy?: 1 - Never  Diabetic?no  Interpreter Needed?: No  Information entered by :: Kirke Shaggy, LPN   Activities of Daily Living    03/18/2022   10:09 AM 01/30/2022   10:39 AM  In your present state of health, do you have any difficulty performing the  following activities:  Hearing? 0 0  Vision? 0 0  Difficulty concentrating or making decisions? 0 0  Walking or climbing stairs? 0 0  Dressing or bathing? 0 0  Doing errands, shopping? 0 0  Preparing Food and eating ? N   Using the Toilet? N   In the past six months, have you accidently leaked urine? N   Do you have problems with loss of bowel control? N   Managing your Medications? N   Managing your Finances? N   Housekeeping or managing your Housekeeping? N     Patient Care Team: Jerrol Banana., MD as PCP - General (Family Medicine) Lin Landsman, MD as Consulting Physician (Gastroenterology) Royston Cowper, MD as Consulting Physician (Urology)  Indicate any recent Medical Services you may have received from other than [facility] providers in the past year (date may be approximate).     Assessment:   This is a routine wellness examination for [place].  Hearing/Vision screen Hearing Screening - Comments:: No aids Vision Screening - Comments:: Readers-    Dietary issues and exercise activities discussed: Current Exercise Habits: Home exercise routine, Type of exercise: walking, Time (Minutes): 30, Frequency (Times/Week): 5,  Weekly Exercise (Minutes/Week): 150, Intensity: Mild   Goals Addressed             This Visit's Progress    DIET - EAT MORE FRUITS AND VEGETABLES         Depression Screen    03/18/2022   10:06 AM 01/30/2022   10:39 AM 07/25/2021    8:48 AM 05/10/2021    9:00 AM 01/24/2021   11:09 AM 06/26/2020    8:37 AM 06/21/2020    8:50 AM  PHQ 2/9 Scores  PHQ - 2 Score 0 0 0 0 0 0 0  PHQ- 9 Score 0 0 0 0 0  0    Fall Risk    03/18/2022   10:09 AM 01/30/2022   10:39 AM 07/25/2021    8:48 AM 05/10/2021    8:59 AM 01/24/2021   11:09 AM  Fall Risk   Falls in the past year? 0 0 0 0 0  Number falls in past yr: 0 0  0 0  Injury with Fall? 0 0  0 0  Risk for fall due to : No Fall Risks No Fall Risks  No Fall Risks No Fall Risks  Follow up Falls prevention discussed;Falls evaluation completed Falls evaluation completed   Falls evaluation completed    FALL RISK PREVENTION PERTAINING TO THE HOME:  Any stairs in or around the home? Yes  If so, are there any without handrails? No  Home free of loose throw rugs in walkways, pet beds, electrical cords, etc? Yes  Adequate lighting in your home to reduce risk of falls? Yes   ASSISTIVE DEVICES UTILIZED TO PREVENT FALLS:  Life alert? No  Use of a cane, walker or w/c? Yes  Grab bars in the bathroom? No  Shower chair or bench in shower? No  Elevated toilet seat or a handicapped toilet? No    Cognitive Function:        03/18/2022   10:10 AM 02/04/2017    8:53 AM  6CIT Screen  What Year? 0 points 0 points  What month? 0 points 0 points  What time? 0 points 0 points  Count back from 20 0 points 0 points  Months in reverse 0 points 0 points  Repeat phrase 0 points 10 points  Total  Score 0 points 10 points    Immunizations Immunization History  Administered Date(s) Administered   Fluad Quad(high Dose 65+) 03/11/2019, 03/14/2020   Influenza Split 04/08/2011   Influenza, High Dose Seasonal PF 02/19/2018   Influenza-Unspecified 02/12/2021    Moderna SARS-COV2 Booster Vaccination 02/12/2021   Moderna Sars-Covid-2 Vaccination 07/05/2019, 08/02/2019   Pneumococcal Conjugate-13 07/20/2014   Pneumococcal Polysaccharide-23 02/19/2018   Tdap 04/08/2011   Zoster, Live 12/24/2011    TDAP status: Due, Education has been provided regarding the importance of this vaccine. Advised may receive this vaccine at local pharmacy or Health Dept. Aware to provide a copy of the vaccination record if obtained from local pharmacy or Health Dept. Verbalized acceptance and understanding.  Flu Vaccine status: Up to date  Pneumococcal vaccine status: Up to date  Covid-19 vaccine status: Completed vaccines  Qualifies for Shingles Vaccine? Yes   Zostavax completed Yes   Shingrix Completed?: No.    Education has been provided regarding the importance of this vaccine. Patient has been advised to call insurance company to determine out of pocket expense if they have not yet received this vaccine. Advised may also receive vaccine at local pharmacy or Health Dept. Verbalized acceptance and understanding.  Screening Tests Health Maintenance  Topic Date Due   Zoster Vaccines- Shingrix (1 of 2) Never done   TETANUS/TDAP  04/07/2021   COVID-19 Vaccine (3 - Moderna series) 04/09/2021   INFLUENZA VACCINE  12/25/2021   COLONOSCOPY (Pts 45-31yr Insurance coverage will need to be confirmed)  10/07/2029   Pneumonia Vaccine 71 Years old  Completed   Hepatitis C Screening  Completed   HPV VACCINES  Aged Out    Health Maintenance  Health Maintenance Due  Topic Date Due   Zoster Vaccines- Shingrix (1 of 2) Never done   TETANUS/TDAP  04/07/2021   COVID-19 Vaccine (3 - Moderna series) 04/09/2021   INFLUENZA VACCINE  12/25/2021    Colorectal cancer screening: Type of screening: Colonoscopy. Completed 10/08/19. Repeat every 10 years  Lung Cancer Screening: (Low Dose CT Chest recommended if Age 73-80years, 30 pack-year currently smoking OR have quit w/in  15years.) does not qualify.    Additional Screening:  Hepatitis C Screening: does qualify; Completed 03/16/13  Vision Screening: Recommended annual ophthalmology exams for early detection of glaucoma and other disorders of the eye. Is the patient up to date with their annual eye exam?  No  Who is the provider or what is the name of the office in which the patient attends annual eye exams? No one If pt is not established with a provider, would they like to be referred to a provider to establish care? No .   Dental Screening: Recommended annual dental exams for proper oral hygiene  Community Resource Referral / Chronic Care Management: CRR required this visit?  No   CCM required this visit?  No      Plan:     I have personally reviewed and noted the following in the patient's chart:   Medical and social history Use of alcohol, tobacco or illicit drugs  Current medications and supplements including opioid prescriptions. Patient is not currently taking opioid prescriptions. Functional ability and status Nutritional status Physical activity Advanced directives List of other physicians Hospitalizations, surgeries, and ER visits in previous 12 months Vitals Screenings to include cognitive, depression, and falls Referrals and appointments  In addition, I have reviewed and discussed with patient certain preventive protocols, quality metrics, and best practice recommendations. A written personalized care plan  for preventive services as well as general preventive health recommendations were provided to patient.     Dionisio David, LPN   [phone]   Nurse Notes: none

## 2022-03-18 NOTE — Patient Instructions (Signed)
Robert Patton , Thank you for taking time to come for your Medicare Wellness Visit. I appreciate your ongoing commitment to your health goals. Please review the following plan we discussed and let me know if I can assist you in the future.   Screening recommendations/referrals: Colonoscopy: 02/08/20 Recommended yearly ophthalmology/optometry visit for glaucoma screening and checkup Recommended yearly dental visit for hygiene and checkup  Vaccinations: Influenza vaccine: 02/12/21 Pneumococcal vaccine: 02/19/18 Tdap vaccine: 04/08/11 Shingles vaccine: Zostavax 12/24/11   Covid-19: 07/05/19, 08/02/19, 02/12/21  Advanced directives: no  Conditions/risks identified: none  Next appointment: Follow up in one year for your annual wellness visit. 03/20/23 @ 10 am by phone  Preventive Care 65 Years and Older, Male Preventive care refers to lifestyle choices and visits with your health care provider that can promote health and wellness. What does preventive care include? A yearly physical exam. This is also called an annual well check. Dental exams once or twice a year. Routine eye exams. Ask your health care provider how often you should have your eyes checked. Personal lifestyle choices, including: Daily care of your teeth and gums. Regular physical activity. Eating a healthy diet. Avoiding tobacco and drug use. Limiting alcohol use. Practicing safe sex. Taking low doses of aspirin every day. Taking vitamin and mineral supplements as recommended by your health care provider. What happens during an annual well check? The services and screenings done by your health care provider during your annual well check will depend on your age, overall health, lifestyle risk factors, and family history of disease. Counseling  Your health care provider may ask you questions about your: Alcohol use. Tobacco use. Drug use. Emotional well-being. Home and relationship well-being. Sexual activity. Eating  habits. History of falls. Memory and ability to understand (cognition). Work and work Statistician. Screening  You may have the following tests or measurements: Height, weight, and BMI. Blood pressure. Lipid and cholesterol levels. These may be checked every 5 years, or more frequently if you are over 17 years old. Skin check. Lung cancer screening. You may have this screening every year starting at age 60 if you have a 30-pack-year history of smoking and currently smoke or have quit within the past 15 years. Fecal occult blood test (FOBT) of the stool. You may have this test every year starting at age 48. Flexible sigmoidoscopy or colonoscopy. You may have a sigmoidoscopy every 5 years or a colonoscopy every 10 years starting at age 2. Prostate cancer screening. Recommendations will vary depending on your family history and other risks. Hepatitis C blood test. Hepatitis B blood test. Sexually transmitted disease (STD) testing. Diabetes screening. This is done by checking your blood sugar (glucose) after you have not eaten for a while (fasting). You may have this done every 1-3 years. Abdominal aortic aneurysm (AAA) screening. You may need this if you are a current or former smoker. Osteoporosis. You may be screened starting at age 58 if you are at high risk. Talk with your health care provider about your test results, treatment options, and if necessary, the need for more tests. Vaccines  Your health care provider may recommend certain vaccines, such as: Influenza vaccine. This is recommended every year. Tetanus, diphtheria, and acellular pertussis (Tdap, Td) vaccine. You may need a Td booster every 10 years. Zoster vaccine. You may need this after age 85. Pneumococcal 13-valent conjugate (PCV13) vaccine. One dose is recommended after age 62. Pneumococcal polysaccharide (PPSV23) vaccine. One dose is recommended after age 62. Talk to your health  care provider about which screenings and  vaccines you need and how often you need them. This information is not intended to replace advice given to you by your health care provider. Make sure you discuss any questions you have with your health care provider. Document Released: 06/09/2015 Document Revised: 01/31/2016 Document Reviewed: 03/14/2015 Elsevier Interactive Patient Education  2017 [employer] Prevention in the Home Falls can cause injuries. They can happen to people of all ages. There are many things you can do to make your home safe and to help prevent falls. What can I do on the outside of my home? Regularly fix the edges of walkways and driveways and fix any cracks. Remove anything that might make you trip as you walk through a door, such as a raised step or threshold. Trim any bushes or trees on the path to your home. Use bright outdoor lighting. Clear any walking paths of anything that might make someone trip, such as rocks or tools. Regularly check to see if handrails are loose or broken. Make sure that both sides of any steps have handrails. Any raised decks and porches should have guardrails on the edges. Have any leaves, snow, or ice cleared regularly. Use sand or salt on walking paths during winter. Clean up any spills in your garage right away. This includes oil or grease spills. What can I do in the bathroom? Use night lights. Install grab bars by the toilet and in the tub and shower. Do not use towel bars as grab bars. Use non-skid mats or decals in the tub or shower. If you need to sit down in the shower, use a plastic, non-slip stool. Keep the floor dry. Clean up any water that spills on the floor as soon as it happens. Remove soap buildup in the tub or shower regularly. Attach bath mats securely with double-sided non-slip rug tape. Do not have throw rugs and other things on the floor that can make you trip. What can I do in the bedroom? Use night lights. Make sure that you have a light by your  bed that is easy to reach. Do not use any sheets or blankets that are too big for your bed. They should not hang down onto the floor. Have a firm chair that has side arms. You can use this for support while you get dressed. Do not have throw rugs and other things on the floor that can make you trip. What can I do in the kitchen? Clean up any spills right away. Avoid walking on wet floors. Keep items that you use a lot in easy-to-reach places. If you need to reach something above you, use a strong step stool that has a grab bar. Keep electrical cords out of the way. Do not use floor polish or wax that makes floors slippery. If you must use wax, use non-skid floor wax. Do not have throw rugs and other things on the floor that can make you trip. What can I do with my stairs? Do not leave any items on the stairs. Make sure that there are handrails on both sides of the stairs and use them. Fix handrails that are broken or loose. Make sure that handrails are as long as the stairways. Check any carpeting to make sure that it is firmly attached to the stairs. Fix any carpet that is loose or worn. Avoid having throw rugs at the top or bottom of the stairs. If you do have throw rugs, attach them to  the floor with carpet tape. Make sure that you have a light switch at the top of the stairs and the bottom of the stairs. If you do not have them, ask someone to add them for you. What else can I do to help prevent falls? Wear shoes that: Do not have high heels. Have rubber bottoms. Are comfortable and fit you well. Are closed at the toe. Do not wear sandals. If you use a stepladder: Make sure that it is fully opened. Do not climb a closed stepladder. Make sure that both sides of the stepladder are locked into place. Ask someone to hold it for you, if possible. Clearly mark and make sure that you can see: Any grab bars or handrails. First and last steps. Where the edge of each step is. Use tools that  help you move around (mobility aids) if they are needed. These include: Canes. Walkers. Scooters. Crutches. Turn on the lights when you go into a dark area. Replace any light bulbs as soon as they burn out. Set up your furniture so you have a clear path. Avoid moving your furniture around. If any of your floors are uneven, fix them. If there are any pets around you, be aware of where they are. Review your medicines with your doctor. Some medicines can make you feel dizzy. This can increase your chance of falling. Ask your doctor what other things that you can do to help prevent falls. This information is not intended to replace advice given to you by your health care provider. Make sure you discuss any questions you have with your health care provider. Document Released: 03/09/2009 Document Revised: 10/19/2015 Document Reviewed: 06/17/2014 Elsevier Interactive Patient Education  2017 [employer].

## 2022-03-21 ENCOUNTER — Telehealth: Payer: Self-pay

## 2022-03-21 NOTE — Telephone Encounter (Signed)
Copied from [place] [phone]. Topic: General - Other >> Mar 21, 2022 11:56 AM Chapman Fitch wrote: Reason for CRM: Pt doesn't want a male and asked if Dr. Caryn Section can take him on as a pt / he is wanting a male / please advise

## 2022-04-26 ENCOUNTER — Telehealth: Payer: Self-pay | Admitting: Family Medicine

## 2022-04-26 DIAGNOSIS — I10 Essential (primary) hypertension: Secondary | ICD-10-CM

## 2022-04-26 MED ORDER — CHLORTHALIDONE 50 MG PO TABS
ORAL_TABLET | ORAL | 0 refills | Status: DC
Start: 2022-04-26 — End: 2022-07-23

## 2022-04-26 NOTE — Telephone Encounter (Signed)
Patient scheduled to see you 07/31/2021

## 2022-04-26 NOTE — Telephone Encounter (Signed)
[facility] faxed refill request for the following medications:  chlorthalidone (HYGROTON) 50 MG tablet    Please advise.

## 2022-04-26 NOTE — Telephone Encounter (Signed)
Refill for chlorthalidone sent to pharmacy   Agree with 08/01/22 appt    Eulis Foster, MD  [facility]

## 2022-07-09 ENCOUNTER — Ambulatory Visit: Payer: Self-pay | Admitting: *Deleted

## 2022-07-09 NOTE — Telephone Encounter (Signed)
  Chief Complaint: Diarrhea Symptoms: Diarrhea 4-5 xs daily since Thursday. Cramping prior to BM, resolves. Stools are loose, no ATBS. Frequency: Thursday Pertinent Negatives: Patient denies fever,no S/S dehydration, no vomiting Disposition: '[]'$ ED /'[]'$ Urgent Care (no appt availability in office) / '[x]'$ Appointment(In office/virtual)/ '[]'$  [facility]/ '[]'$ Home Care/ '[]'$ Refused Recommended Disposition /'[]'$ [facility] [facility]/ '[]'$  Follow-up with PCP Additional Notes: Appt secured for tomorrow per pt's  schedule. Care advise provided, verbalizes understanding.  Reason for Disposition  [1] MODERATE diarrhea (e.g., 4-6 times / day more than normal) AND [2] present > 48 hours (2 days)  Answer Assessment - Initial Assessment Questions 1. DIARRHEA SEVERITY: "How bad is the diarrhea?" "How many more stools have you had in the past 24 hours than normal?"    - NO DIARRHEA (SCALE 0)   - MILD (SCALE 1-3): Few loose or mushy BMs; increase of 1-3 stools over normal daily number of stools; mild increase in ostomy output.   -  MODERATE (SCALE 4-7): Increase of 4-6 stools daily over normal; moderate increase in ostomy output.   -  SEVERE (SCALE 8-10; OR "WORST POSSIBLE"): Increase of 7 or more stools daily over normal; moderate increase in ostomy output; incontinence.     4-5 times, none Saturday 2. ONSET: "When did the diarrhea begin?"      Thursday 3. BM CONSISTENCY: "How loose or watery is the diarrhea?"      loose 4. VOMITING: "Are you also vomiting?" If Yes, ask: "How many times in the past 24 hours?"      No 5. ABDOMEN PAIN: "Are you having any abdomen pain?" If Yes, ask: "What does it feel like?" (e.g., crampy, dull, intermittent, constant)      Cramping prior to moving bowels 6. ABDOMEN PAIN SEVERITY: If present, ask: "How bad is the pain?"  (e.g., Scale 1-10; mild, moderate, or severe)   - MILD (1-3): doesn't interfere with normal activities, abdomen soft and not tender to touch    - MODERATE  (4-7): interferes with normal activities or awakens from sleep, abdomen tender to touch    - SEVERE (8-10): excruciating pain, doubled over, unable to do any normal activities       Cramping prior to BM 7. ORAL INTAKE: If vomiting, "Have you been able to drink liquids?" "How much liquids have you had in the past 24 hours?"     NA 8. HYDRATION: "Any signs of dehydration?" (e.g., dry mouth [not just dry lips], too weak to stand, dizziness, new weight loss) "When did you last urinate?"     Staying hydrated 10. ANTIBIOTIC USE: "Are you taking antibiotics now or have you taken antibiotics in the past 2 months?"       No 11. OTHER SYMPTOMS: "Do you have any other symptoms?" (e.g., fever, blood in stool)      no  Protocols used: Diarrhea-A-AH

## 2022-07-10 ENCOUNTER — Ambulatory Visit: Payer: Commercial Managed Care - PPO | Admitting: Family Medicine

## 2022-07-11 ENCOUNTER — Encounter: Payer: Self-pay | Admitting: Family Medicine

## 2022-07-11 ENCOUNTER — Other Ambulatory Visit: Payer: Self-pay | Admitting: Family Medicine

## 2022-07-11 ENCOUNTER — Ambulatory Visit: Payer: Commercial Managed Care - PPO | Admitting: Family Medicine

## 2022-07-11 ENCOUNTER — Telehealth: Payer: Self-pay | Admitting: Family Medicine

## 2022-07-11 ENCOUNTER — Ambulatory Visit (INDEPENDENT_AMBULATORY_CARE_PROVIDER_SITE_OTHER): Payer: Commercial Managed Care - PPO | Admitting: Family Medicine

## 2022-07-11 VITALS — BP 127/64 | HR 75 | Temp 98.3°F | Wt 165.2 lb

## 2022-07-11 DIAGNOSIS — K529 Noninfective gastroenteritis and colitis, unspecified: Secondary | ICD-10-CM

## 2022-07-11 DIAGNOSIS — Z5320 Procedure and treatment not carried out because of patient's decision for unspecified reasons: Secondary | ICD-10-CM | POA: Diagnosis not present

## 2022-07-11 MED ORDER — SUCRALFATE 1 G PO TABS: 1.0000 g | ORAL_TABLET | Freq: Three times a day (TID) | ORAL | 0 refills | Status: DC

## 2022-07-11 MED ORDER — PANTOPRAZOLE SODIUM 40 MG PO TBEC: 40.0000 mg | DELAYED_RELEASE_TABLET | Freq: Two times a day (BID) | ORAL | 0 refills | Status: DC

## 2022-07-11 NOTE — Assessment & Plan Note (Addendum)
Acute, self limiting Notes some cramping with solid foods resulting in quick BM following indigestion Denies concerns with PO intake of fluids or liquids (soup) No known sick contacts No known travel Exam benign UTD on colon cancer screening; denies melena or PRBPR Recommend CBC, CMP, enzymes as well as H Pylori. Trial of carafate and PPI. Recommend imaging and/or f/u with [facility] with GI if symptoms are continued or labs unremarkable.

## 2022-07-11 NOTE — Telephone Encounter (Signed)
Received a fax from [employer]mym[employer] for pantoprazole sodium 29m DR tablets  Key: [id number]

## 2022-07-11 NOTE — Progress Notes (Signed)
I,Connie R Striblin,acting as a Education administrator for Gwyneth Sprout, FNP.,have documented all relevant documentation on the behalf of Gwyneth Sprout, FNP,as directed by  Gwyneth Sprout, FNP while in the presence of Gwyneth Sprout, FNP.  Established patient visit  Patient: Robert Patton   DOB: Dec 23, 1950   72 y.o. Male  MRN: [id number] Visit Date: 07/11/2022  Today's healthcare provider: Gwyneth Sprout, FNP  Introduced to nurse practitioner role and practice setting.  All questions answered.  Discussed provider/patient relationship and expectations.  Subjective    GASTROENTERITIS Duration: Diarrhea: no   Cramping--> BM; not diarrhea. Thursday, Sunday x2, Wednesday x2 Nausea: no Vomiting: no Episodes of vomit/day:  Abdominal pain: yes Fever: no Decreased appetite: yes Tolerating liquids: yes Foreign travel: no Relevant dietary history:  Similar illness in contacts: no Recent antibiotic use: no Status: fluctuating Treatments attempted:      Diarrhea: Patient complains of diarrhea. Onset of diarrhea was several days ago. Diarrhea is occurring approximately 2 times per day. . Diarrhea has been associated with  abdominal cramps .  Patient denies blood in stool, fever.  Previous visits for diarrhea: none. Treatment to date: none   Patients states that his stomach cramps and that causes him to have bowl movements symptoms started 6 days ago.   Medications: Outpatient Medications Prior to Visit  Medication Sig   acetaminophen (TYLENOL) 500 MG tablet Take 1,000 mg by mouth every 6 (six) hours as needed (FOR PAIN.).   amLODipine (NORVASC) 5 MG tablet TAKE 1 TABLET(5 MG) BY MOUTH DAILY   Calcium-Magnesium-Zinc (CAL-MAG-ZINC PO) Take 1 tablet by mouth daily.   carboxymethylcellulose 1 % ophthalmic solution Place 1-2 drops into both eyes 2 (two) times daily.   gabapentin (NEURONTIN) 100 MG capsule Take 100 mg by mouth 2 (two) times daily.   losartan (COZAAR) 100 MG tablet Take 0.5 tablets (50 mg  total) by mouth daily.   Multiple Vitamin (MULTIVITAMIN WITH MINERALS) TABS tablet Take 1 tablet by mouth daily.   tadalafil (CIALIS) 5 MG tablet Take 5 mg by mouth daily.   Vitamin D, Ergocalciferol, (DRISDOL) 1.25 MG (50000 UNIT) CAPS capsule TAKE 1 CAPSULE BY MOUTH EVERY 7 DAYS   chlorthalidone (HYGROTON) 50 MG tablet TAKE 1 TABLET(50 MG) BY MOUTH EVERY MORNING   [DISCONTINUED] cyclobenzaprine (FLEXERIL) 5 MG tablet Take 1-2 tablets (5-10 mg total) by mouth every evening. (Patient not taking: Reported on 07/11/2022)   [DISCONTINUED] gabapentin (NEURONTIN) 400 MG capsule TAKE 1 CAPSULE(400 MG) BY MOUTH TWICE DAILY (Patient not taking: Reported on 07/11/2022)   [DISCONTINUED] levofloxacin (LEVAQUIN) 500 MG tablet Take 1 tablet (500 mg total) by mouth daily. (Patient not taking: Reported on 07/11/2022)   [DISCONTINUED] traMADol (ULTRAM) 50 MG tablet Take 50 mg by mouth every 6 (six) hours as needed. (Patient not taking: Reported on 07/11/2022)   No facility-administered medications prior to visit.   Review of Systems    Objective    BP 127/64 (BP Location: Left Arm, Patient Position: Sitting, Cuff Size: Normal)   Pulse 75   Temp 98.3 F (36.8 C) (Oral)   Wt 165 lb 3.2 oz (74.9 kg)   SpO2 99%   BMI 23.70 kg/m   Physical Exam Vitals and nursing note reviewed.  Constitutional:      General: He is not in acute distress.    Appearance: Normal appearance. He is normal weight. He is not ill-appearing, toxic-appearing or diaphoretic.  HENT:     Head: Normocephalic and atraumatic.  Eyes:     Pupils: Pupils are equal, round, and reactive to light.  Cardiovascular:     Rate and Rhythm: Normal rate and regular rhythm.     Pulses: Normal pulses.     Heart sounds: Normal heart sounds.  Pulmonary:     Effort: Pulmonary effort is normal.     Breath sounds: Normal breath sounds.  Abdominal:     General: Abdomen is flat. Bowel sounds are normal. There is no distension.     Palpations: Abdomen is  soft. There is no mass.     Tenderness: There is no abdominal tenderness. There is no right CVA tenderness, left CVA tenderness, guarding or rebound.     Hernia: No hernia is present.  Musculoskeletal:        General: Normal range of motion.     Cervical back: Normal range of motion.  Skin:    General: Skin is warm and dry.     Capillary Refill: Capillary refill takes less than 2 seconds.  Neurological:     General: No focal deficit present.     Mental Status: He is alert and oriented to person, place, and time. Mental status is at baseline.  Psychiatric:        Mood and Affect: Mood normal.        Behavior: Behavior normal.        Thought Content: Thought content normal.        Judgment: Judgment normal.     No results found for any visits on 07/11/22.  Assessment & Plan     Problem List Items Addressed This Visit       Digestive   Gastroenteritis - Primary    Acute, self limiting Notes some cramping with solid foods resulting in quick BM following indigestion Denies concerns with PO intake of fluids or liquids (soup) No known sick contacts No known travel Exam benign UTD on colon cancer screening; denies melena or PRBPR Recommend CBC, CMP, enzymes as well as H Pylori. Trial of carafate and PPI. Recommend imaging and/or f/u with [facility] with GI if symptoms are continued or labs unremarkable.      Relevant Medications   sucralfate (CARAFATE) 1 g tablet   pantoprazole (PROTONIX) 40 MG tablet   Other Relevant Orders   Comprehensive Metabolic Panel (CMET)   CBC with Differential/Platelet   H. pylori breath test   Lipase   Amylase     Other   Statin declined   Return if symptoms worsen or fail to improve.     Vonna Kotyk, FNP, have reviewed all documentation for this visit. The documentation on 07/11/22 for the exam, diagnosis, procedures, and orders are all accurate and complete.  Gwyneth Sprout, [place] [phone]  (phone) [phone] (fax)  [place]

## 2022-07-12 NOTE — Progress Notes (Signed)
All labs are normal and stable; suggesting probable viral cause. Breath test is pending. Recommend imaging and/or follow up with GI if symptoms do not improve over the weekend.  Please let us know if you have any questions.  Thank you, Gwyneth Sprout, [address] [place], [place] [address] [phone] (phone) [phone] (fax) [place]

## 2022-07-13 LAB — CBC WITH DIFFERENTIAL/PLATELET
Basophils Absolute: 0 10*3/uL (ref 0.0–0.2)
Basos: 1 %
EOS (ABSOLUTE): 0.1 10*3/uL (ref 0.0–0.4)
Eos: 4 %
Hematocrit: 41.6 % (ref 37.5–51.0)
Hemoglobin: 13.7 g/dL (ref 13.0–17.7)
Immature Grans (Abs): 0 10*3/uL (ref 0.0–0.1)
Immature Granulocytes: 0 %
Lymphocytes Absolute: 1.7 10*3/uL (ref 0.7–3.1)
Lymphs: 49 %
MCH: 28 pg (ref 26.6–33.0)
MCHC: 32.9 g/dL (ref 31.5–35.7)
MCV: 85 fL (ref 79–97)
Monocytes Absolute: 0.4 10*3/uL (ref 0.1–0.9)
Monocytes: 11 %
Neutrophils Absolute: 1.3 10*3/uL — ABNORMAL LOW (ref 1.4–7.0)
Neutrophils: 35 %
Platelets: 183 10*3/uL (ref 150–450)
RBC: 4.89 x10E6/uL (ref 4.14–5.80)
RDW: 13.5 % (ref 11.6–15.4)
WBC: 3.6 10*3/uL (ref 3.4–10.8)

## 2022-07-13 LAB — COMPREHENSIVE METABOLIC PANEL
ALT: 18 IU/L (ref 0–44)
AST: 22 IU/L (ref 0–40)
Albumin/Globulin Ratio: 1.7 (ref 1.2–2.2)
Albumin: 4.4 g/dL (ref 3.8–4.8)
Alkaline Phosphatase: 84 IU/L (ref 44–121)
BUN/Creatinine Ratio: 18 (ref 10–24)
BUN: 18 mg/dL (ref 8–27)
Bilirubin Total: 0.6 mg/dL (ref 0.0–1.2)
CO2: 24 mmol/L (ref 20–29)
Calcium: 9.4 mg/dL (ref 8.6–10.2)
Chloride: 101 mmol/L (ref 96–106)
Creatinine, Ser: 0.99 mg/dL (ref 0.76–1.27)
Globulin, Total: 2.6 g/dL (ref 1.5–4.5)
Glucose: 88 mg/dL (ref 70–99)
Potassium: 3.5 mmol/L (ref 3.5–5.2)
Sodium: 142 mmol/L (ref 134–144)
Total Protein: 7 g/dL (ref 6.0–8.5)
eGFR: 80 mL/min/{1.73_m2} (ref >59)

## 2022-07-13 LAB — LIPASE: Lipase: 23 U/L (ref 13–78)

## 2022-07-13 LAB — H. PYLORI BREATH TEST: H pylori Breath Test: POSITIVE — AB

## 2022-07-13 LAB — AMYLASE: Amylase: 95 U/L (ref 31–110)

## 2022-07-14 ENCOUNTER — Other Ambulatory Visit: Payer: Self-pay | Admitting: Family Medicine

## 2022-07-14 DIAGNOSIS — K529 Noninfective gastroenteritis and colitis, unspecified: Secondary | ICD-10-CM

## 2022-07-14 MED ORDER — TETRACYCLINE HCL 500 MG PO CAPS: 500.0000 mg | ORAL_CAPSULE | Freq: Four times a day (QID) | ORAL | 0 refills | Status: DC

## 2022-07-14 MED ORDER — METRONIDAZOLE 500 MG PO TABS: 500.0000 mg | ORAL_TABLET | Freq: Four times a day (QID) | ORAL | 0 refills | Status: AC

## 2022-07-14 MED ORDER — BISMUTH SUBSALICYLATE 262 MG PO CHEW: 524.0000 mg | CHEWABLE_TABLET | Freq: Three times a day (TID) | ORAL | 0 refills | Status: AC

## 2022-07-14 NOTE — Progress Notes (Signed)
H Pylori positive. Additional 3 medications called in to assist with Protonix. 14 days of treatment.

## 2022-07-15 ENCOUNTER — Ambulatory Visit: Payer: Self-pay

## 2022-07-15 NOTE — Telephone Encounter (Signed)
Please have patient schedule appt for follow in order to answer questions and follow up results of lab work.   Eulis Foster, MD  [facility]

## 2022-07-15 NOTE — Telephone Encounter (Signed)
Message from [place] sent at 07/15/2022 12:02 PM EST  Summary: Pt has questions/concerns about recent symptoms and diagnosis   Pt spouse requests that a clinical staff member return the call as they have questions/concerns about recent symptoms and diagnosis.           Second attempt to reach pt spouse- LM on VM to call back.

## 2022-07-15 NOTE — Telephone Encounter (Deleted)
.  CO

## 2022-07-15 NOTE — Telephone Encounter (Signed)
3rd attempt to return call without success.   Left a voicemail to call back.   Per policy I have forwarded this to [facility] since 3 attempts have been made to contact them.

## 2022-07-15 NOTE — Telephone Encounter (Signed)
Patient's wife Philomena, on [place], called, left VM to return the call to the office to speak to a nurse.   Summary: Pt has questions/concerns about recent symptoms and diagnosis   Pt spouse requests that a clinical staff member return the call as they have questions/concerns about recent symptoms and diagnosis.

## 2022-07-17 NOTE — Telephone Encounter (Signed)
LM that PA for Pantoprazole was approved and to start medication to start treatment for H.Pilori

## 2022-07-23 ENCOUNTER — Other Ambulatory Visit: Payer: Self-pay | Admitting: Family Medicine

## 2022-07-23 DIAGNOSIS — I10 Essential (primary) hypertension: Secondary | ICD-10-CM

## 2022-07-31 NOTE — Progress Notes (Signed)
I,Joseline E Rosas,acting as a scribe for [employer], MD.,have documented all relevant documentation on the behalf of Eulis Foster, MD,as directed by  Eulis Foster, MD while in the presence of Eulis Foster, MD.   Established patient visit   Patient: Robert Patton   DOB: 26-Apr-1951   72 y.o. Male  MRN: [id number] Visit Date: 08/01/2022  Today's healthcare provider: Eulis Foster, MD   Chief Complaint  Patient presents with   Follow-up   Subjective    HPI  Hypertension, follow-up  BP Readings from Last 3 Encounters:  08/01/22 130/71  07/11/22 127/64  01/30/22 (!) 162/82   Wt Readings from Last 3 Encounters:  08/01/22 166 lb 3.2 oz (75.4 kg)  07/11/22 165 lb 3.2 oz (74.9 kg)  03/18/22 169 lb (76.7 kg)     He was last seen for hypertension 6 months ago.  BP at that visit was 162/82. Management since that visit includes continue Amlodipine 5 mg, chlorthalidone '50mg'$  and Losartan '100mg'$ .  He reports excellent compliance with treatment. He is not having side effects.   Outside blood pressures are 10's-135/70's. 125/70 last night.  Symptoms: No chest pain No chest pressure  No palpitations No syncope  No dyspnea No orthopnea  No paroxysmal nocturnal dyspnea No lower extremity edema   Pertinent labs Lab Results  Component Value Date   CHOL 185 01/30/2022   HDL 80 01/30/2022   LDLCALC 95 01/30/2022   TRIG 52 01/30/2022   CHOLHDL 2.3 01/30/2022   Lab Results  Component Value Date   NA 142 07/11/2022   K 3.5 07/11/2022   CREATININE 0.99 07/11/2022   EGFR 80 07/11/2022   GLUCOSE 88 07/11/2022   TSH 1.850 01/30/2022     The 10-year ASCVD risk score (Arnett DK, et al., 2019) is: 18.4%  ---------------------------------------------------------------------------------------------------   Appetite changes Patient reports that since starting the abx he will feel hungry but can not eat the same amount  He  denies nausea He denies feeling easily full but just does not want to continue eating like before Denies dysphagia   ED  Patient has been on Cialis  He and wife state that it has not been as effective   Medications: Outpatient Medications Prior to Visit  Medication Sig   acetaminophen (TYLENOL) 500 MG tablet Take 1,000 mg by mouth every 6 (six) hours as needed (FOR PAIN.).   amLODipine (NORVASC) 5 MG tablet TAKE 1 TABLET(5 MG) BY MOUTH DAILY   bismuth subsalicylate (PEPTO BISMOL) 262 MG chewable tablet Chew 2 tablets (524 mg total) by mouth 4 (four) times daily -  before meals and at bedtime.   Calcium-Magnesium-Zinc (CAL-MAG-ZINC PO) Take 1 tablet by mouth daily.   carboxymethylcellulose 1 % ophthalmic solution Place 1-2 drops into both eyes 2 (two) times daily.   chlorthalidone (HYGROTON) 50 MG tablet TAKE 1 TABLET(50 MG) BY MOUTH EVERY MORNING   gabapentin (NEURONTIN) 100 MG capsule Take 100 mg by mouth 2 (two) times daily.   losartan (COZAAR) 100 MG tablet Take 0.5 tablets (50 mg total) by mouth daily.   Multiple Vitamin (MULTIVITAMIN WITH MINERALS) TABS tablet Take 1 tablet by mouth daily.   pantoprazole (PROTONIX) 40 MG tablet Take 1 tablet (40 mg total) by mouth 2 (two) times daily before a meal.   sucralfate (CARAFATE) 1 g tablet Take 1 tablet (1 g total) by mouth 4 (four) times daily -  with meals and at bedtime.   tetracycline (SUMYCIN) 500 MG capsule Take 1  capsule (500 mg total) by mouth 4 (four) times daily.   Vitamin D, Ergocalciferol, (DRISDOL) 1.25 MG (50000 UNIT) CAPS capsule TAKE 1 CAPSULE BY MOUTH EVERY 7 DAYS   [DISCONTINUED] tadalafil (CIALIS) 5 MG tablet Take 5 mg by mouth daily.   No facility-administered medications prior to visit.    Review of Systems     Objective    BP 130/71 (BP Location: Left Arm, Patient Position: Sitting, Cuff Size: Large)   Pulse 79   Temp 98.3 F (36.8 C)   Resp 16   Wt 166 lb 3.2 oz (75.4 kg)   BMI 23.85 kg/m    Physical  Exam Vitals reviewed.  Constitutional:      General: He is not in acute distress.    Appearance: Normal appearance. He is not ill-appearing, toxic-appearing or diaphoretic.  Eyes:     Conjunctiva/sclera: Conjunctivae normal.  Neck:     Thyroid: No thyroid mass, thyromegaly or thyroid tenderness.     Vascular: No carotid bruit.  Cardiovascular:     Rate and Rhythm: Normal rate and regular rhythm.     Pulses: Normal pulses.     Heart sounds: Normal heart sounds. No murmur heard.    No friction rub. No gallop.  Pulmonary:     Effort: Pulmonary effort is normal. No respiratory distress.     Breath sounds: Normal breath sounds. No stridor. No wheezing, rhonchi or rales.  Abdominal:     General: Bowel sounds are normal. There is no distension.     Palpations: Abdomen is soft.     Tenderness: There is no abdominal tenderness.  Musculoskeletal:     Right lower leg: No edema.     Left lower leg: No edema.  Lymphadenopathy:     Cervical: No cervical adenopathy.  Skin:    Findings: No erythema or rash.  Neurological:     Mental Status: He is alert and oriented to person, place, and time.     No results found for any visits on 08/01/22.  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        The entirety of the information documented in the History of Present Illness, Review of Systems and Physical Exam were personally obtained by me. Portions of this information were initially documented by  Lyndel Pleasure, CMA . I, Eulis Foster, MD have reviewed the documentation above for thoroughness and accuracy.      Eulis Foster, MD  [facility] [phone] (phone) [phone] (fax)  [place]

## 2022-08-01 ENCOUNTER — Encounter: Payer: Self-pay | Admitting: Family Medicine

## 2022-08-01 ENCOUNTER — Ambulatory Visit (INDEPENDENT_AMBULATORY_CARE_PROVIDER_SITE_OTHER): Payer: Commercial Managed Care - PPO | Admitting: Family Medicine

## 2022-08-01 VITALS — BP 130/71 | HR 79 | Temp 98.3°F | Resp 16 | Wt 166.2 lb

## 2022-08-01 DIAGNOSIS — N529 Male erectile dysfunction, unspecified: Secondary | ICD-10-CM

## 2022-08-01 DIAGNOSIS — I10 Essential (primary) hypertension: Secondary | ICD-10-CM | POA: Diagnosis not present

## 2022-08-01 MED ORDER — TADALAFIL 10 MG PO TABS: 10.0000 mg | ORAL_TABLET | ORAL | 1 refills | Status: DC | PRN

## 2022-08-01 NOTE — Patient Instructions (Signed)
It was a pleasure to see you today!  Thank you for choosing [facility] for your primary care.   Robert Patton was seen for blood pressure.   Our plans for today were: Please hold the amlodipine '5mg'$  daily and continue the chlorthalidone and losartan '100mg'$  daily  Please monitor your blood pressure with goal of less than 140/90. Please keep record of this and follow up with me in 1 month  I will increase the dose of your Cialis     You should return to our clinic in 1 month for blood pressure.   Best Wishes,   Dr. Quentin Cornwall

## 2022-08-03 NOTE — Assessment & Plan Note (Signed)
Chronic  Patient and wife report not as effective as before  Increased dose to '10mg'$  PRN

## 2022-08-03 NOTE — Assessment & Plan Note (Signed)
Controlled BP at goal Continue current medications at current doses No medications changes today   

## 2022-08-07 ENCOUNTER — Other Ambulatory Visit: Payer: Self-pay | Admitting: Family Medicine

## 2022-08-07 DIAGNOSIS — K529 Noninfective gastroenteritis and colitis, unspecified: Secondary | ICD-10-CM

## 2022-08-07 NOTE — Telephone Encounter (Signed)
Requested Prescriptions  Pending Prescriptions Disp Refills   sucralfate (CARAFATE) 1 g tablet [Pharmacy Med Name: SUCRALFATE 1GM TABLETS] 360 tablet 0    Sig: TAKE 1 TABLET(1 GRAM) BY MOUTH FOUR TIMES DAILY WITH MEALS AND AT BEDTIME     Gastroenterology: Antiacids Passed - 08/07/2022  3:32 AM      Passed - Valid encounter within last 12 months    Recent Outpatient Visits           6 days ago Essential (primary) hypertension   [place] [facility], [place], MD   3 weeks ago [place] Gwyneth Sprout, FNP   6 months ago Annual physical exam   [facility] Eulas Post, MD   9 months ago [place], Jake Church, DO   1 year ago Essential (primary) hypertension   [place] Eulas Post, MD

## 2022-08-12 ENCOUNTER — Other Ambulatory Visit: Payer: Self-pay | Admitting: Family Medicine

## 2022-08-12 ENCOUNTER — Ambulatory Visit: Payer: Self-pay | Admitting: *Deleted

## 2022-08-12 DIAGNOSIS — I10 Essential (primary) hypertension: Secondary | ICD-10-CM

## 2022-08-12 MED ORDER — LOSARTAN POTASSIUM 100 MG PO TABS: 50.0000 mg | ORAL_TABLET | Freq: Every day | ORAL | 1 refills | Status: DC

## 2022-08-12 NOTE — Telephone Encounter (Signed)
Reason for Disposition  Caller has medicine question only, adult not sick, AND triager answers question  Answer Assessment - Initial Assessment Questions 1. NAME of MEDICINE: "What medicine(s) are you calling about?"     Carafate  2. QUESTION: "What is your question?" (e.g., double dose of medicine, side effect)     Wife got on line. The first bottle was 120 tablets and this one is 360 tablets.  She had a question about the amount.     3. PRESCRIBER: "Who prescribed the medicine?" Reason: if prescribed by specialist, call should be referred to that group.     Dr. Alba Cory 4. SYMPTOMS: "Do you have any symptoms?" If Yes, ask: "What symptoms are you having?"  "How bad are the symptoms (e.g., mild, moderate, severe)     Questions about the amount in the bottle. 5. PREGNANCY:  "Is there any chance that you are pregnant?" "When was your last menstrual period?"     N/A  Protocols used: Medication Question [place]  Chief Complaint: Wife got on line.   She wants him to be retested for his stomach.  (Due to language barrier I had a very hard time understanding what she needed/wanted, strong accent and talking fast) Symptoms: Wife wants him tested and checked after starting Carafate. Frequency: N/A Pertinent Negatives: Patient denies N/A Disposition: [] ED /[] Urgent Care (no appt availability in office) / [x] Appointment(In office/virtual)/ []  [facility]/ [] Home Care/ [] Refused Recommended Disposition /[] [place] [facility]/ []  Follow-up with PCP Additional Notes: Appt. Made with Dr. Alba Cory for 08/20/2022 at 9:00.  She originally called in questioning the Carafate amount.   First bottle he got was 120 tablets.   This 2nd bottle was 360 tablets.   She wanted to know why the change.    I let her know the first one was for a one month supply to see how he does with the new medication.   Then if he tolerates it the provider will give a 3 month supply.   (Due to a  communication barrier I had a very difficult time understanding this pt and with assisting her).    Husband was in the background talking to her and she was talking to me.   He was on the phone originally but she got on and spoke with me for the rest of the call.

## 2022-08-12 NOTE — Telephone Encounter (Signed)
Message from Marlis Edelson sent at 08/12/2022  8:38 AM EDT  Summary: rx concern   The patient would like to speak with a member of clinical staff regarding their sucralfate (CARAFATE) 1 g tablet [id number] prescription  The patient shares that their pharmacy has provided them with a medication that does not seem to be correct to what was previously discussed with their PCP  Please contact the patient further when possible          Call History   Type Contact Phone/Fax User  08/12/2022 08:36 AM EDT Phone (Incoming) [place], Kolbi Shetty (Self) [phone] (H) Coley, Everette A

## 2022-08-14 ENCOUNTER — Other Ambulatory Visit: Payer: Self-pay | Admitting: Family Medicine

## 2022-08-14 DIAGNOSIS — K529 Noninfective gastroenteritis and colitis, unspecified: Secondary | ICD-10-CM

## 2022-08-15 DIAGNOSIS — H25812 Combined forms of age-related cataract, left eye: Secondary | ICD-10-CM | POA: Diagnosis not present

## 2022-08-16 NOTE — Progress Notes (Deleted)
      Established patient visit   Patient: Robert Patton   DOB: 15-Jul-1950   72 y.o. Male  MRN: [id number] Visit Date: 08/20/2022  Today's healthcare provider: Eulis Foster, MD   No chief complaint on file.  Subjective    HPI  Patient comes in for follow-up BP and stomach issues  Medications: Outpatient Medications Prior to Visit  Medication Sig   acetaminophen (TYLENOL) 500 MG tablet Take 1,000 mg by mouth every 6 (six) hours as needed (FOR PAIN.).   amLODipine (NORVASC) 5 MG tablet TAKE 1 TABLET(5 MG) BY MOUTH DAILY   bismuth subsalicylate (PEPTO BISMOL) 262 MG chewable tablet Chew 2 tablets (524 mg total) by mouth 4 (four) times daily -  before meals and at bedtime.   Calcium-Magnesium-Zinc (CAL-MAG-ZINC PO) Take 1 tablet by mouth daily.   carboxymethylcellulose 1 % ophthalmic solution Place 1-2 drops into both eyes 2 (two) times daily.   chlorthalidone (HYGROTON) 50 MG tablet TAKE 1 TABLET(50 MG) BY MOUTH EVERY MORNING   gabapentin (NEURONTIN) 100 MG capsule Take 100 mg by mouth 2 (two) times daily.   losartan (COZAAR) 100 MG tablet Take 0.5 tablets (50 mg total) by mouth daily.   Multiple Vitamin (MULTIVITAMIN WITH MINERALS) TABS tablet Take 1 tablet by mouth daily.   pantoprazole (PROTONIX) 40 MG tablet TAKE 1 TABLET(40 MG) BY MOUTH TWICE DAILY BEFORE A MEAL   sucralfate (CARAFATE) 1 g tablet TAKE 1 TABLET(1 GRAM) BY MOUTH FOUR TIMES DAILY WITH MEALS AND AT BEDTIME   tadalafil (CIALIS) 10 MG tablet Take 1 tablet (10 mg total) by mouth every other day as needed for erectile dysfunction.   tetracycline (SUMYCIN) 500 MG capsule Take 1 capsule (500 mg total) by mouth 4 (four) times daily.   Vitamin D, Ergocalciferol, (DRISDOL) 1.25 MG (50000 UNIT) CAPS capsule TAKE 1 CAPSULE BY MOUTH EVERY 7 DAYS   No facility-administered medications prior to visit.    Review of Systems  {Labs  Heme  Chem  Endocrine  Serology  Results Review (optional):23779}   Objective     There were no vitals taken for this visit. {Show previous vital signs (optional):23777}  Physical Exam  ***  No results found for any visits on 08/20/22.  Assessment & Plan     ***  No follow-ups on file.      {provider attestation***:1}   Eulis Foster, MD  [facility] [phone] (phone) [phone] (fax)  [place]

## 2022-08-20 ENCOUNTER — Ambulatory Visit: Payer: Commercial Managed Care - PPO | Admitting: Family Medicine

## 2022-08-26 ENCOUNTER — Telehealth: Payer: Self-pay | Admitting: Family Medicine

## 2022-08-26 DIAGNOSIS — I10 Essential (primary) hypertension: Secondary | ICD-10-CM

## 2022-08-26 MED ORDER — AMLODIPINE BESYLATE 5 MG PO TABS: ORAL_TABLET | ORAL | 1 refills | Status: DC

## 2022-08-26 NOTE — Telephone Encounter (Signed)
[facility] pharmacy faxed refill request for the following medications: ? ? ?amLODipine (NORVASC) 5 MG tablet  ? ?Please advise ? ?

## 2022-09-18 ENCOUNTER — Encounter: Payer: Self-pay | Admitting: Urology

## 2022-09-18 ENCOUNTER — Ambulatory Visit (INDEPENDENT_AMBULATORY_CARE_PROVIDER_SITE_OTHER): Payer: Commercial Managed Care - PPO | Admitting: Urology

## 2022-09-18 VITALS — BP 150/80 | HR 72 | Ht 70.0 in | Wt 162.0 lb

## 2022-09-18 DIAGNOSIS — N529 Male erectile dysfunction, unspecified: Secondary | ICD-10-CM

## 2022-09-18 DIAGNOSIS — N432 Other hydrocele: Secondary | ICD-10-CM

## 2022-09-18 DIAGNOSIS — R972 Elevated prostate specific antigen [PSA]: Secondary | ICD-10-CM

## 2022-09-18 MED ORDER — TADALAFIL 20 MG PO TABS: 20.0000 mg | ORAL_TABLET | Freq: Every day | ORAL | 6 refills | Status: DC | PRN

## 2022-09-18 NOTE — Progress Notes (Signed)
09/18/22 11:49 AM   Robert Patton 05-27-1951 [id number]  CC: Elevated PSA, ED, hydrocele  HPI: 72 year old male here with his wife today to discuss the above issues.  He was previously followed by Dr. Sheppard Patton.  Outside records were reviewed at length.  He has a long history of an elevated PSA(5.8) and had a negative prostate biopsy in 2019, relatively benign prostate MRI in March 2023 with a 80 g prostate and a small PI-RADS 3 lesion.  He underwent repeat MRI fusion biopsy with Dr. Sheppard Patton in June 2023 and biopsy was negative.  Most recent PSA decreased to 5.0 in September 2023, and PSA density very reassuring at 0.06.  He has ED that is responsive to Cialis 20 mg on demand.  He would like to continue this medication.  He also has a long history of a recurrent left-sided hydrocele, this has reportedly been aspirated at least 3 times previously by Dr. Sheppard Patton.  He does not seem particularly bothered by the left scrotal swelling, but his wife had a number of concerns today about possible hydrocele.  He denies any urinary symptoms.  PMH: Past Medical History:  Diagnosis Date   Arthritis    ED (erectile dysfunction)    Hyperlipidemia    Hypertension     Surgical History: Past Surgical History:  Procedure Laterality Date   COLONOSCOPY  08/15/2009   COLONOSCOPY WITH PROPOFOL N/A 10/08/2019   Procedure: COLONOSCOPY WITH PROPOFOL;  Surgeon: Robert Reil, MD;  Location: [facility] ENDOSCOPY;  Service: Gastroenterology;  Laterality: N/A;   EYE SURGERY Right 2016   CATARACT EXTRACTION   INGUINAL HERNIA REPAIR Right 07/22/2017   Procedure: HERNIA REPAIR INGUINAL ADULT;  Surgeon: Robert Ape, MD;  Location: [facility] ORS;  Service: Urology;  Laterality: Right;   PROSTATE BIOPSY N/A 11/08/2021   Procedure: PROSTATE BIOPSY Robert Patton;  Surgeon: Robert Ape, MD;  Location: [facility] ORS;  Service: Urology;  Laterality: N/A;    Family History: Family History  Problem Relation Age of Onset    Hypertension Brother     Social History:  reports that he has never smoked. He has never been exposed to tobacco smoke. He has never used smokeless tobacco. He reports that he does not drink alcohol and does not use drugs.  Physical Exam: BP (!) 150/80   Pulse 72   Ht  (1.778 m)   Wt 162 lb (73.5 kg)   BMI 23.24 kg/m    Constitutional:  Alert and oriented, No acute distress. Cardiovascular: No clubbing, cyanosis, or edema. Respiratory: Normal respiratory effort, no increased work of breathing. GI: Abdomen is soft, nontender, nondistended, no abdominal masses GU: Testicles 20 cc and descended bilaterally, small left hydrocele, nontender, no masses  Laboratory Data: See HPI for PSA history  Pertinent Imaging: I have personally viewed and interpreted the prostate MRI showing an 80 g prostate, small PI-RADS 3 lesion.  Assessment & Plan:   72 year old male previously followed by Dr. Evelene Patton for mildly elevated PSA, ED responsive to Cialis, and left hydrocele.  Outside records reviewed at length.  Reassurance provided regarding his PSA-most recent PSA stable at 5.0, reassuring PSA density of 0.06, and negative prostate biopsy x 2.  I think it would be very reasonable to discontinue screening per the guideline recommendations.  He would like to repeat PSA next year and consider discontinuing screening if stable.  Cialis was refilled, risks and benefits were discussed.  In terms of the hydrocele, he does not seem particularly bothered by  this and it appears rather small on exam.  Could consider an ultrasound for further evaluation.  We discussed options including observation or hydrocelectomy, and he opts for observation.  Risks and benefits of hydrocelectomy discussed at length.  RTC 1 year PSA reflex to free prior  Robert Rams, MD 09/18/2022  [facility] [address], [address] [place], [place] [address] [phone]

## 2022-09-18 NOTE — Patient Instructions (Signed)
Prostate Cancer Screening  Prostate cancer screening is testing that is done to check for the presence of prostate cancer in men. The prostate gland is a walnut-sized gland that is located below the bladder and in front of the rectum in males. The function of the prostate is to add fluid to semen during ejaculation. Prostate cancer is one of the most common types of cancer in men. Who should have prostate cancer screening? Screening recommendations vary based on age and other risk factors, as well as between the professional organizations who make the recommendations. In general, screening is recommended if: You are age 50 to 70 and have an average risk for prostate cancer. You should talk with your health care provider about your need for screening and how often screening should be done. Because most prostate cancers are slow growing and will not cause death, screening in this age group is generally reserved for men who have a 10- to 15-year life expectancy. You are younger than age 50, and you have these risk factors: Having a father, brother, or uncle who has been diagnosed with prostate cancer. The risk is higher if your family member's cancer occurred at an early age or if you have multiple family members with prostate cancer at an early age. Being a male who is Black or is of Caribbean or sub-Saharan African descent. In general, screening is not recommended if: You are younger than age 40. You are between the ages of 40 and 49 and you have no risk factors. You are 70 years of age or older. At this age, the risks that screening can cause are greater than the benefits that it may provide. If you are at high risk for prostate cancer, your health care provider may recommend that you have screenings more often or that you start screening at a younger age. How is screening for prostate cancer done? The recommended prostate cancer screening test is a blood test called the prostate-specific antigen  (PSA) test. PSA is a protein that is made in the prostate. As you age, your prostate naturally produces more PSA. Abnormally high PSA levels may be caused by: Prostate cancer. An enlarged prostate that is not caused by cancer (benign prostatic hyperplasia, or BPH). This condition is very common in older men. A prostate gland infection (prostatitis) or urinary tract infection. Certain medicines such as male hormones (like testosterone) or other medicines that raise testosterone levels. A rectal exam may be done as part of prostate cancer screening to help provide information about the size of your prostate gland. When a rectal exam is performed, it should be done after the PSA level is drawn to avoid any effect on the results. Depending on the PSA results, you may need more tests, such as: A physical exam to check the size of your prostate gland, if not done as part of screening. Blood and imaging tests. A procedure to remove tissue samples from your prostate gland for testing (biopsy). This is the only way to know for certain if you have prostate cancer. What are the benefits of prostate cancer screening? Screening can help to identify cancer at an early stage, before symptoms start and when the cancer can be treated more easily. There is a small chance that screening may lower your risk of dying from prostate cancer. The chance is small because prostate cancer is a slow-growing cancer, and most men with prostate cancer die from a different cause. What are the risks of prostate cancer screening? The   main risk of prostate cancer screening is diagnosing and treating prostate cancer that would never have caused any symptoms or problems. This is called overdiagnosisand overtreatment. PSA screening cannot tell you if your PSA is high due to cancer or a different cause. A prostate biopsy is the only procedure to diagnose prostate cancer. Even the results of a biopsy may not tell you if your cancer needs to  be treated. Slow-growing prostate cancer may not need any treatment other than monitoring, so diagnosing and treating it may cause unnecessary stress or other side effects. Questions to ask your health care provider When should I start prostate cancer screening? What is my risk for prostate cancer? How often do I need screening? What type of screening tests do I need? How do I get my test results? What do my results mean? Do I need treatment? Where to find more information The American Cancer Society: www.cancer.org American U[employer] Association: www.auanet.org Contact a health care provider if: You have difficulty urinating. You have pain when you urinate or ejaculate. You have blood in your urine or semen. You have pain in your back or in the area of your prostate. Summary Prostate cancer is a common type of cancer in men. The prostate gland is located below the bladder and in front of the rectum. This gland adds fluid to semen during ejaculation. Prostate cancer screening may identify cancer at an early stage, when the cancer can be treated more easily and is less likely to have spread to other areas of the body. The prostate-specific antigen (PSA) test is the recommended screening test for prostate cancer, but it has associated risks. Discuss the risks and benefits of prostate cancer screening with your health care provider. If you are age 7 or older, the risks that screening can cause are greater than the benefits that it may provide. This information is not intended to replace advice given to you by your health care provider. Make sure you discuss any questions you have with your health care provider. Document Revised: 11/06/2020 Document Reviewed: 11/06/2020 Elsevier Patient Education  2023 [employer]  Hydrocele, Adult A hydrocele is a collection of fluid in the loose pouch of skin that holds the testicles (scrotum). It can occur in one or both testicles. This may happen  because: The amount of fluid produced in the scrotum is not absorbed by the rest of the body. Fluid from the abdomen fills the scrotum. Normally, the testicles develop in the abdomen and then drop into the scrotum before birth. The tube that the testicles travel through usually closes after the testicles drop. If the tube does not close, fluid from the abdomen can fill the scrotum. This is not very common in adults. What are the causes? A hydrocele may be caused by: An injury to the scrotum. An infection. Decreased blood flow to the scrotum. Twisting of a testicle (testicular torsion). A birth defect. A tumor or cancer of the testicle. Sometimes, the cause is not known. What are the signs or symptoms? A hydrocele feels like a water-filled balloon. It may also feel heavy. Other symptoms include: Swelling of the scrotum. The swelling may decrease when you lie down. You may also notice more swelling at night than in the morning. This is called a communicating hydrocele, in which the fluid in the scrotum goes back into the abdominal cavity when the position of the scrotum changes. Swelling of the groin. Mild discomfort in the scrotum. Pain. This can develop if the hydrocele was  caused by infection or twisting. The larger the hydrocele, the more likely you are to have pain. Swelling may also cause pain. How is this diagnosed? This condition may be diagnosed based on a physical exam and your medical history. You may also have tests, including: Imaging tests, such as an ultrasound. A transillumination test. This test takes place in a dark room where a light is placed on the skin of the scrotum. Clear liquid will not impede the light and the scrotum will be illuminated. This helps a health care provider distinguish a hydrocele from a tumor. Blood or urine tests. How is this treated? Most hydroceles go away on their own. If you have no discomfort or pain, your health care provider may suggest close  monitoring of your condition until the condition goes away or symptoms develop. This is called watch and wait or watchful waiting. If treatment is needed, it may include: Treating an underlying condition. This may include taking an antibiotic medicine to treat an infection. Having surgery to stop fluid from collecting in the scrotum. Having surgery to drain the fluid. Surgery may include: Hydrocelectomy. For this procedure, an incision is made in the scrotum to remove the fluid. Needle aspiration. A needle is used to drain fluid. However, the fluid buildup will come back quickly and may lead to an infection of the scrotum. This is rarely done. Follow these instructions at home: Medicines Take over-the-counter and prescription medicines only as told by your health care provider. If you were prescribed an antibiotic medicine, take it as told by your health care provider. Do not stop taking the antibiotic even if you start to feel better. General instructions Watch the hydrocele for any changes. Keep all follow-up visits. This is important. Contact a health care provider if: You notice any changes in the hydrocele. The swelling in your scrotum or groin gets worse. The hydrocele becomes red, firm, painful, or tender to the touch. You have a fever. Get help right away if you: Develop a lot of pain or your pain becomes worse. Have chills. Have a high fever. Summary A hydrocele is a collection of fluid in the loose pouch of skin that holds the testicles (scrotum). A hydrocele can cause swelling, discomfort, and pain. In adults, the cause of a hydrocele may not be known. However, it is sometimes caused by an infection or the twisting of a testicle. Hydroceles often go away on their own. If a hydrocele causes pain, treating the underlying cause may be needed to ease the pain. This information is not intended to replace advice given to you by your health care provider. Make sure you discuss any  questions you have with your health care provider. Document Revised: 12/28/2020 Document Reviewed: 12/28/2020 Elsevier Patient Education  2023 [employer]  Hydrocelectomy, Adult  A hydrocelectomy is a surgical procedure to remove a collection of fluid (hydrocele) from the scrotum. The scrotum is the pouch that holds the testicles. You may need to have this procedure if a hydrocele is causing painful swelling in your scrotum. Tell a health care provider about: Any allergies you have. All medicines you are taking, including vitamins, herbs, eye drops, creams, and over-the-counter medicines. Any problems you or family members have had with anesthetic medicines. Any bleeding problems you have. Any surgeries you have had. Any medical conditions you have. What are the risks? Generally, this is a safe procedure. However, problems may occur, including: Bleeding into the scrotum (scrotal hematoma). Damage to nearby structures or organs, including  to the testicle or the tube that carries sperm out of the testicle (vas deferens). Infection. Allergic reactions to medicines. What happens before the procedure? When to stop eating and drinking Follow instructions from your health care provider about what you may eat and drink before your procedure. These may include: 8 hours before your procedure Stop eating most foods. Do not eat meat, fried foods, or fatty foods. Eat only light foods, such as toast or crackers. All liquids are okay except energy drinks and alcohol. 6 hours before your procedure Stop eating. Drink only clear liquids, such as water, clear fruit juice, black coffee, plain tea, and sports drinks. Do not drink energy drinks or alcohol. 2 hours before your procedure Stop drinking all liquids. You may be allowed to take medicines with small sips of water. If you do not follow your health care provider's instructions, your procedure may be delayed or canceled. Medicines Ask your  health care provider about: Changing or stopping your regular medicines. This is especially important if you are taking diabetes medicines or blood thinners. Taking medicines such as aspirin and ibuprofen. These medicines can thin your blood. Do not take these medicines unless your health care provider tells you to take them. Taking over-the-counter medicines, vitamins, herbs, and supplements. Surgery safety Ask your health care provider: How your surgery site will be marked. What steps will be taken to help prevent infection. These steps may include: Removing hair at the surgery site. Washing skin with a germ-killing soap. Taking antibiotic medicine. General instructions Do not use any products that contain nicotine or tobacco for at least 4 weeks before the procedure. These products include cigarettes, chewing tobacco, and vaping devices, such as e-cigarettes. If you need help quitting, ask your health care provider. If you will be going home right after the procedure, plan to have a responsible adult: Take you home from the hospital or clinic. You will not be allowed to drive. Care for you for the time you are told. What happens during the procedure? An IV will be inserted into one of your veins. You will be given one or both of the following: A medicine to make you relax (sedative). A medicine to make you fall asleep (general anesthetic). A small incision will be made through the skin of your scrotum. Your testicle and the hydrocele will be located, and the hydrocele sac will be opened with an incision. The fluid will be drained from the hydrocele. Part of the hydrocele sac may be removed. The hydrocele will be closed with stitches that dissolve (absorbable sutures). This prevents fluid from building up again. If your hydrocele is large, you may have a thin, rubber drain placed to allow fluid to drain after the procedure. The incision in your scrotum will be closed with absorbable  sutures, skin glue, or adhesive strips. A bandage (dressing) will be placed over the incision. The dressing may be held in place with an athletic support strap (scrotal support). The procedure may vary among health care providers and hospitals. What happens after the procedure?  Your blood pressure, heart rate, breathing rate, and blood oxygen level will be monitored until you leave the hospital or clinic. You will be given pain medicine as needed. Your IV will be removed, and your insertion site will be checked for bleeding. You may need to wear a scrotal support. This holds the dressing in place and supports your scrotum. If you were given a sedative during the procedure, it can affect you for several  hours. Do not drive or operate machinery until your health care provider says that it is safe. Summary A hydrocelectomy is a surgical procedure to remove a collection of fluid from the scrotum. You may need to have this procedure if a hydrocele is causing painful swelling in your scrotum. During the procedure, the hydrocele will be drained and then closed with stitches that dissolve (absorbable sutures). This prevents fluid from building up again. If your hydrocele is large, you may have a thin, rubber drain placed to allow fluid to drain after the procedure. You may need to wear a scrotal support after your procedure. This holds the dressing in place and supports your scrotum. This information is not intended to replace advice given to you by your health care provider. Make sure you discuss any questions you have with your health care provider. Document Revised: 12/28/2020 Document Reviewed: 12/28/2020 Elsevier Patient Education  2023 [employer].   Hydrocelectomy, Adult, Care After The following information offers guidance on how to care for yourself after your procedure. Your health care provider may also give you more specific instructions. If you have problems or questions, contact your  health care provider. What can I expect after the procedure? After your procedure, it is common to have: Mild discomfort and swelling in the pouch that holds your testicles (scrotum). Bruising of the scrotum. Follow these instructions at home: Medicines Take over-the-counter and prescription medicines only as told by your health care provider. Ask your health care provider if the medicine prescribed to you: Requires you to avoid driving or using machinery. Can cause constipation. You may need to take these actions to prevent or treat constipation: Drink enough fluid to keep your urine pale yellow. Take over-the-counter or prescription medicines. Eat foods that are high in fiber, such as beans, whole grains, and fresh fruits and vegetables. Limit foods that are high in fat and processed sugars, such as fried or sweet foods. Bathing Do not take baths, swim, or use a hot tub until your health care provider approves. Ask your health care provider if you may take showers. You may only be allowed to take sponge baths. If you were told to wear an athletic support strap (scrotal support), keep it dry. Take it off when you shower or bathe. Incision care  Follow instructions from your health care provider about how to take care of your incision. Make sure you: Wash your hands with soap and water for at least 20 seconds before and after you change your bandage (dressing). If soap and water are not available, use hand sanitizer. Change your dressing as told by your health care provider. Leave stitches (sutures), skin glue, or adhesive strips in place. These skin closures may need to stay in place for 2 weeks or longer. If adhesive strip edges start to loosen and curl up, you may trim the loose edges. Do not remove adhesive strips completely unless your health care provider tells you to do that. Check your incision and scrotum every day for signs of infection. Check for: More redness, swelling, or  pain. Fluid or blood. Warmth. Pus or a bad smell. Managing pain and swelling If directed, put ice on the affected area. To do this: Put ice in a plastic bag. Place a towel between your skin and the bag. Leave the ice on for 20 minutes, 2-3 times per day. Remove the ice if your skin turns bright red. This is very important. If you cannot feel pain, heat, or cold, you have  a greater risk of damage to the area.  Activity Do not lift anything that is heavier than 10 lb (4.5 kg) until your health care provider says that it is safe. If you were given a sedative during the procedure, it can affect you for several hours. Do not drive or operate machinery until your health care provider says that it is safe. Ask your health care provider when it is safe to drive. Return to your normal activities as told by your health care provider. Ask your health care provider what activities are safe for you. General instructions Do not use any products that contain nicotine or tobacco. These products include cigarettes, chewing tobacco, and vaping devices, such as e-cigarettes. These can delay healing after surgery. If you need help quitting, ask your health care provider. If you were given a scrotal support, wear it as told by your health care provider. Keep all follow-up visits. This is important. If you had a drain put in during the procedure, you will need to have it removed at a follow-up visit. Contact a health care provider if: Your pain is not controlled with medicine. You have more redness, swelling, or pain around your scrotum. You have fluid or blood coming from your incision. Your incision feels warm to the touch. You have pus or a bad smell coming from your incision. You have a fever. Get help right away if: You develop shaking, chills, and a fever that is higher than 101.77F (38.8C). You have redness or swelling that starts at your scrotum and spreads outward to your whole groin. You develop  swelling in your legs. You have difficulty breathing. These symptoms may be an emergency. Get help right away. Call 911. Do not wait to see if the symptoms will go away. Do not drive yourself to the hospital. Summary After a hydrocelectomy, it is common to have mild discomfort, swelling, and bruising. Do not take baths, swim, or use a hot tub until your health care provider approves. Ask your health care provider if you may take showers. If directed, put ice on the affected area to help with pain and swelling. Do not lift anything that is heavier than 10 lb (4.5 kg) until your health care provider says that it is safe. Return to your normal activities as told by your health care provider. If you were given a scrotal support, keep it dry. Wear the scrotal support as told by your health care provider. This information is not intended to replace advice given to you by your health care provider. Make sure you discuss any questions you have with your health care provider. Document Revised: 12/28/2020 Document Reviewed: 12/28/2020 Elsevier Patient Education  2023 [employer].

## 2022-09-30 ENCOUNTER — Other Ambulatory Visit: Payer: Self-pay | Admitting: Family Medicine

## 2022-09-30 NOTE — Telephone Encounter (Signed)
[facility] pharmacy faxed refill request for the following medications:    Vitamin D, Ergocalciferol, (DRISDOL) 1.25 MG (50000 UNIT) CAPS capsule   Please advise  

## 2022-10-14 NOTE — Progress Notes (Unsigned)
I,Robert Patton,acting as a scribe for [employer], MD.,have documented all relevant documentation on the behalf of Robert Ramp, MD,as directed by  Robert Ramp, MD while in the presence of Robert Ramp, MD.   Established patient visit   Patient: Robert Patton   DOB: [birth year]   72 y.o. Male  MRN: [id number] Visit Date: 10/15/2022  Today's healthcare provider: [name], MD   Chief Complaint  Patient presents with   Follow-up   Subjective    HPI  Patient comes for refill on Vitamin D deficiency. Need refill.  Trigger Finger  Reports that his ring finger on his right hand has been getting stuck in the flexed position and he has to physically straighten it to get it to move He denies weakness, numbness in the right hand    Reports that he did not sleep well last night and attributes his elevated blood pressure to this  Reports that he has had BP at home within normal range prior to this AM  Last night was 125/70 before going to bed but was not able to sleep until 3AM and then had to be here early for this appt   Medications: Outpatient Medications Prior to Visit  Medication Sig   acetaminophen (TYLENOL) 500 MG tablet Take 1,000 mg by mouth every 6 (six) hours as needed (FOR PAIN.).   amLODipine (NORVASC) 5 MG tablet TAKE 1 TABLET(5 MG) BY MOUTH DAILY   bismuth subsalicylate (PEPTO BISMOL) 262 MG chewable tablet Chew 2 tablets (524 mg total) by mouth 4 (four) times daily -  before meals and at bedtime.   Calcium-Magnesium-Zinc (CAL-MAG-ZINC PO) Take 1 tablet by mouth daily.   carboxymethylcellulose 1 % ophthalmic solution Place 1-2 drops into both eyes 2 (two) times daily.   chlorthalidone (HYGROTON) 50 MG tablet TAKE 1 TABLET(50 MG) BY MOUTH EVERY MORNING   gabapentin (NEURONTIN) 100 MG capsule Take 100 mg by mouth 2 (two) times daily.   losartan (COZAAR) 100 MG tablet Take 0.5 tablets (50 mg total) by mouth  daily.   Multiple Vitamin (MULTIVITAMIN WITH MINERALS) TABS tablet Take 1 tablet by mouth daily.   pantoprazole (PROTONIX) 40 MG tablet TAKE 1 TABLET(40 MG) BY MOUTH TWICE DAILY BEFORE A MEAL   sucralfate (CARAFATE) 1 g tablet TAKE 1 TABLET(1 GRAM) BY MOUTH FOUR TIMES DAILY WITH MEALS AND AT BEDTIME   tadalafil (CIALIS) 20 MG tablet Take 1 tablet (20 mg total) by mouth daily as needed for erectile dysfunction.   Vitamin D, Ergocalciferol, (DRISDOL) 1.25 MG (50000 UNIT) CAPS capsule TAKE 1 CAPSULE BY MOUTH EVERY 7 DAYS   No facility-administered medications prior to visit.    Review of Systems     Objective    BP (!) 151/67 (BP Location: Left Arm, Patient Position: Sitting, Cuff Size: Normal)   Pulse (!) 59   Temp 98.2 F (36.8 C) (Oral)   Resp 16   Wt 165 lb (74.8 kg)   BMI 23.68 kg/m    Physical Exam Vitals reviewed.  Constitutional:      General: He is not in acute distress.    Appearance: Normal appearance. He is not ill-appearing, toxic-appearing or diaphoretic.  Cardiovascular:     Rate and Rhythm: Normal rate and regular rhythm.     Pulses: Normal pulses.     Heart sounds: Normal heart sounds. No murmur heard.    No friction rub. No gallop.  Pulmonary:     Effort: Pulmonary effort is normal. No  respiratory distress.     Breath sounds: Normal breath sounds. No stridor. No wheezing, rhonchi or rales.  Abdominal:     General: Bowel sounds are normal. There is no distension.     Palpations: Abdomen is soft.     Tenderness: There is no abdominal tenderness.  Musculoskeletal:     Right hand: Swelling, tenderness and bony tenderness present. Normal range of motion. Normal strength. Normal capillary refill. Normal pulse.     Left hand: Normal.     Right lower leg: No edema.     Left lower leg: No edema.     Comments: Swelling of the third PCP joint     Skin:    Findings: No erythema or rash.  Neurological:     Mental Status: He is alert.       No results found  for any visits on 10/15/22.  Assessment & Plan     Problem List Items Addressed This Visit       Musculoskeletal and Integument   Trigger ring finger of right hand    Acute  Patient prefers conservative measures at this time  Recommended wearing splint, patient given picture of example to be worn with activity during the day  If no improvement after 4 weeks, can consider injection         Other   Avitaminosis D - Primary    Chronic  Last levels checked several years ago  Recommended rechecking vitamin D levels today and will have patient start daily oral replacement for levels over 20 vs continue weekly supplementation for levels under 20 Patient voiced understanding  Vitamin D ordered        Relevant Orders   Vitamin D (25 hydroxy)     Return in about 4 months (around 02/15/2023) for AWV .        The entirety of the information documented in the History of Present Illness, Review of Systems and Physical Exam were personally obtained by me. Portions of this information were initially documented by Hetty Ely, CMA . I, Robert Ramp, MD have reviewed the documentation above for thoroughness and accuracy.      Robert Ramp, MD  [facility] [phone] (phone) [phone] (fax)  [facility]

## 2022-10-15 ENCOUNTER — Encounter: Payer: Self-pay | Admitting: Family Medicine

## 2022-10-15 ENCOUNTER — Ambulatory Visit (INDEPENDENT_AMBULATORY_CARE_PROVIDER_SITE_OTHER): Payer: Commercial Managed Care - PPO | Admitting: Family Medicine

## 2022-10-15 VITALS — BP 151/67 | HR 59 | Temp 98.2°F | Resp 16 | Wt 165.0 lb

## 2022-10-15 DIAGNOSIS — N529 Male erectile dysfunction, unspecified: Secondary | ICD-10-CM

## 2022-10-15 DIAGNOSIS — E559 Vitamin D deficiency, unspecified: Secondary | ICD-10-CM

## 2022-10-15 DIAGNOSIS — M65341 Trigger finger, right ring finger: Secondary | ICD-10-CM | POA: Diagnosis not present

## 2022-10-15 DIAGNOSIS — I10 Essential (primary) hypertension: Secondary | ICD-10-CM

## 2022-10-15 NOTE — Patient Instructions (Addendum)
Please use the type of splint shown to help your finger and wear during the day while doing activities for the next 4 weeks.   Trigger Finger  Trigger finger, also called stenosing tenosynovitis, is a condition that causes a finger or thumb to get stuck in a bent position. Each finger has tough, cord-like tissue (tendon) that connects muscle to bone, and each tendon passes through a tunnel of tissue (tendon sheath). The tendon sheaths are held close to the bone by a pulley. There is a pulley called the A1 pulley that is involved in the triggering of a finger or thumb. To move your finger, your tendon needs to glide freely through the sheath. Trigger finger happens when the tendon or the sheath thickens, making it difficult to bend or straighten your finger as the thickened tendon gets stuck in the A1 pulley. Trigger finger can affect any of the fingers or the thumbs. Mild cases may clear up with rest and medicine. Severe cases require more treatment. What are the causes? Trigger finger or thumb is caused by a thickened finger tendon or tendon sheath. The cause of this thickening is not known. What increases the risk? The following factors may make you more likely to develop this condition: Doing the same movements many times (repetitive activity) that require a strong grip. Having certain health conditions. These include rheumatoid arthritis, gout, carpal tunnel syndrome, or diabetes. Being 67-9 years old. Being male. What are the signs or symptoms? Symptoms of this condition include: Pain when bending or straightening your finger. Tenderness, swelling, or a lump in the palm of your hand just below the finger joint. Hearing a noise like a pop or a snap when you try to straighten your finger. Feeling a catch or locked feeling when you try to straighten your finger. Being unable to straighten your finger without help from your other hand. How is this diagnosed? This condition is  diagnosed based on your symptoms and a physical exam. How is this treated? This condition may be treated by: Resting your finger and avoiding activities that make symptoms worse. Wearing a finger splint to keep your finger extended. Taking NSAIDs, such as ibuprofen, to relieve pain and swelling around the tendon. Doing gentle exercises to stretch the finger as told by your health care provider. Having medicine that reduces swelling and inflammation (steroids) injected into the tendon sheath. Injections may need to be repeated. Trigger finger release. This surgery is done to open the pulley. This may be done if other treatments do not work and you cannot straighten or bend your finger. You may need hand therapy after surgery. Follow these instructions at home: If you have a removable splint: Wear the splint as told by your provider. Remove it only as told by your provider. Check the skin around the splint every day. Tell your provider about any concerns. Loosen the splint if your fingers tingle, become numb, or turn cold and blue. Keep the splint clean and dry. If the splint is not waterproof: Do not let it get wet. Cover it with a watertight covering when you take a bath or shower. Managing pain, stiffness, and swelling     If told, put ice on the painful area. If you have a removable splint, remove it as told by your health care provider. Put ice in a plastic bag. Place a towel between your skin and the bag or between your splint and the bag. Leave the ice on for 20  minutes, 2-3 times a day. If told, apply heat to the affected area as often as told by your provider. Use the heat source that your provider recommends, such as a moist heat pack or a heating pad. Place a towel between your skin and the heat source. Leave the heat on for 20-30 minutes. If your skin turns bright red, remove the ice or heat right away to prevent skin damage. The risk of damage is higher if you cannot feel  pain, heat, or cold. Activity Rest your finger as told by your provider. Avoid activities that make the pain worse. Return to your normal activities as told by your provider. Ask your provider what activities are safe for you. Do exercises as told by your provider. Ask your provider when it is safe to drive if you have a splint on your hand. General instructions Take over-the-counter and prescription medicines only as told by your provider. Keep all follow-up visits. These are needed to see how you are progressing. Contact a health care provider if: Your symptoms are not improving with home care. This information is not intended to replace advice given to you by your health care provider. Make sure you discuss any questions you have with your health care provider. Document Revised: 12/28/2021 Document Reviewed: 12/28/2021 Elsevier Patient Education  2023 [employer].

## 2022-10-15 NOTE — Assessment & Plan Note (Signed)
Chronic  Last levels checked several years ago  Recommended rechecking vitamin D levels today and will have patient start daily oral replacement for levels over 20 vs continue weekly supplementation for levels under 20 Patient voiced understanding  Vitamin D ordered

## 2022-10-15 NOTE — Assessment & Plan Note (Signed)
Acute  Patient prefers conservative measures at this time  Recommended wearing splint, patient given picture of example to be worn with activity during the day  If no improvement after 4 weeks, can consider injection

## 2022-10-16 LAB — VITAMIN D 25 HYDROXY (VIT D DEFICIENCY, FRACTURES): Vit D, 25-Hydroxy: 47.7 ng/mL (ref 30.0–100.0)

## 2022-10-16 LAB — SPECIMEN STATUS REPORT

## 2022-10-23 ENCOUNTER — Telehealth: Payer: Self-pay

## 2022-10-23 NOTE — Telephone Encounter (Signed)
Copied from CRM [id number]. Topic: General - Other >> Oct 23, 2022 10:20 AM Dondra Prader A wrote: Reason for CRM: Pt states that he seen his PCP the other week and was told that he can get his Vitamin D over the counter and do not have to have a prescription anymore. Pt is needing to know how many units he need to get over the counter and would like to speak with his PCP nurse. Please advise.

## 2022-10-24 NOTE — Telephone Encounter (Signed)
Something with [phone] units of vitamin D, such as centrum silver

## 2022-10-24 NOTE — Telephone Encounter (Signed)
Patient advised.

## 2022-11-04 ENCOUNTER — Other Ambulatory Visit: Payer: Self-pay | Admitting: Family Medicine

## 2022-11-04 DIAGNOSIS — K529 Noninfective gastroenteritis and colitis, unspecified: Secondary | ICD-10-CM

## 2023-02-05 DIAGNOSIS — M65341 Trigger finger, right ring finger: Secondary | ICD-10-CM | POA: Diagnosis not present

## 2023-02-28 ENCOUNTER — Other Ambulatory Visit: Payer: Self-pay | Admitting: Family Medicine

## 2023-02-28 DIAGNOSIS — I10 Essential (primary) hypertension: Secondary | ICD-10-CM

## 2023-02-28 NOTE — Telephone Encounter (Signed)
Requested Prescriptions  Pending Prescriptions Disp Refills   amLODipine (NORVASC) 5 MG tablet [Pharmacy Med Name: AMLODIPINE BESYLATE 5MG  TABLETS] 90 tablet 0    Sig: TAKE 1 TABLET(5 MG) BY MOUTH DAILY     Cardiovascular: Calcium Channel Blockers 2 Failed - 02/28/2023  3:33 AM      Failed - Last BP in normal range    BP Readings from Last 1 Encounters:  10/15/22 (!) 151/67         Passed - Last Heart Rate in normal range    Pulse Readings from Last 1 Encounters:  10/15/22 (!) 59         Passed - Valid encounter within last 6 months    Recent Outpatient Visits           4 months ago Avitaminosis D   [place] [facility] Simmons-Robinson, [place], MD   7 months ago Essential (primary) hypertension   [place] [facility] [place], [place], MD   7 months ago Gastroenteritis   [facility] Merita Norton T, FNP   1 year ago Annual physical exam   [facility] Bosie Clos, MD   1 year ago COVID-19   [facility] [place], Darl Householder, DO       Future Appointments             In 6 months Richardo Hanks, Laurette Schimke, MD [facility] [place]

## 2023-03-03 NOTE — Progress Notes (Unsigned)
      Established patient visit   Patient: Robert Patton   DOB: [birth year]   72 y.o. Male  MRN: [id number] Visit Date: 03/04/2023  Today's healthcare provider: [name], MD   No chief complaint on file.  Subjective       Discussed the use of AI scribe software for clinical note transcription with the patient, who gave verbal consent to proceed.  History of Present Illness             Past Medical History:  Diagnosis Date   Arthritis    ED (erectile dysfunction)    Hyperlipidemia    Hypertension     Medications: Outpatient Medications Prior to Visit  Medication Sig   acetaminophen (TYLENOL) 500 MG tablet Take 1,000 mg by mouth every 6 (six) hours as needed (FOR PAIN.).   amLODipine (NORVASC) 5 MG tablet TAKE 1 TABLET(5 MG) BY MOUTH DAILY   bismuth subsalicylate (PEPTO BISMOL) 262 MG chewable tablet Chew 2 tablets (524 mg total) by mouth 4 (four) times daily -  before meals and at bedtime.   Calcium-Magnesium-Zinc (CAL-MAG-ZINC PO) Take 1 tablet by mouth daily.   carboxymethylcellulose 1 % ophthalmic solution Place 1-2 drops into both eyes 2 (two) times daily.   chlorthalidone (HYGROTON) 50 MG tablet TAKE 1 TABLET(50 MG) BY MOUTH EVERY MORNING   gabapentin (NEURONTIN) 100 MG capsule Take 100 mg by mouth 2 (two) times daily.   losartan (COZAAR) 100 MG tablet Take 0.5 tablets (50 mg total) by mouth daily.   Multiple Vitamin (MULTIVITAMIN WITH MINERALS) TABS tablet Take 1 tablet by mouth daily.   pantoprazole (PROTONIX) 40 MG tablet TAKE 1 TABLET(40 MG) BY MOUTH TWICE DAILY BEFORE A MEAL   sucralfate (CARAFATE) 1 g tablet TAKE 1 TABLET BY MOUTH FOUR TIMES DAILY( AT BEDTIME AND WITH MEALS)   tadalafil (CIALIS) 20 MG tablet Take 1 tablet (20 mg total) by mouth daily as needed for erectile dysfunction.   Vitamin D, Ergocalciferol, (DRISDOL) 1.25 MG (50000 UNIT) CAPS capsule TAKE 1 CAPSULE BY MOUTH EVERY 7 DAYS   No facility-administered medications prior to  visit.    Review of Systems  {Insert previous labs (optional):23779} {See past labs  Heme  Chem  Endocrine  Serology  Results Review (optional):1}   Objective    There were no vitals taken for this visit. {Insert last BP/Wt (optional):23777}{See vitals history (optional):1}   Physical Exam  ***  No results found for any visits on 03/04/23.  Assessment & Plan     Problem List Items Addressed This Visit   None   Assessment and Plan              No follow-ups on file.         [name], MD  [facility] [phone] (phone) [phone] (fax)  [facility]

## 2023-03-04 ENCOUNTER — Ambulatory Visit (INDEPENDENT_AMBULATORY_CARE_PROVIDER_SITE_OTHER): Payer: Medicare Other | Admitting: Family Medicine

## 2023-03-04 ENCOUNTER — Encounter: Payer: Self-pay | Admitting: Family Medicine

## 2023-03-04 VITALS — BP 140/80 | HR 68 | Ht 70.0 in | Wt 165.5 lb

## 2023-03-04 DIAGNOSIS — I10 Essential (primary) hypertension: Secondary | ICD-10-CM

## 2023-03-04 DIAGNOSIS — M65341 Trigger finger, right ring finger: Secondary | ICD-10-CM

## 2023-03-04 DIAGNOSIS — Z23 Encounter for immunization: Secondary | ICD-10-CM

## 2023-03-04 DIAGNOSIS — N529 Male erectile dysfunction, unspecified: Secondary | ICD-10-CM

## 2023-03-04 NOTE — Patient Instructions (Signed)
VISIT SUMMARY:  During your recent visit, we discussed your hypertension, gastrointestinal symptoms, trigger finger, and erectile dysfunction. You reported that you stopped taking Protonix due to increased hunger and have not experienced any stomach pain or diarrhea since. Your trigger finger has improved after receiving an injection from an orthopedic specialist. Your blood pressure was slightly elevated during the visit, but you mentioned that it is usually lower at home. You also mentioned that Cialis has been helpful for your erectile dysfunction.  YOUR PLAN:  -HYPERTENSION: Hypertension is a condition where the force of the blood against the artery walls is too high. We will continue with your current blood pressure medications, Amlodipine 5mg  and Chlorthalidone 50mg  daily. Please record your home blood pressure readings for review at your next visit.  -GASTROINTESTINAL SYMPTOMS: You reported that you stopped taking Protonix due to increased hunger and have not experienced any stomach pain or diarrhea since. Therefore, we will discontinue Protonix 40mg  as you have no current gastrointestinal symptoms.  -TRIGGER FINGER: Trigger finger is a condition where one of your fingers gets stuck in a bent position. Your condition has improved after receiving an injection from an orthopedic specialist. We will continue with the current management and consider a repeat injection if symptoms recur.  -ERECTILE DYSFUNCTION: Erectile dysfunction is a condition where a man has difficulty getting or maintaining an erection. You reported that Cialis has been helpful for this condition. We will continue with Cialis 10mg  as needed.  INSTRUCTIONS:  We have planned for your annual wellness visit in a month. We will also order labs at your next visit. Please continue to monitor your blood pressure at home and record the readings for review at your next visit.

## 2023-03-04 NOTE — Assessment & Plan Note (Signed)
Blood pressure slightly elevated in office, but patient reports lower readings at home. Currently on Amlodipine 5mg  and Chlorthalidone 50mg  daily. Chronic, mildly elevated today  -Continue current antihypertensive regimen. -Advise patient to record home blood pressure readings for review at next visit.

## 2023-03-04 NOTE — Assessment & Plan Note (Signed)
Improvement noted after injection and use of splint. -Continue current management. Consider repeat injection if symptoms recur. -managed by orthopedics

## 2023-03-04 NOTE — Assessment & Plan Note (Signed)
Patient reports improvement with Cialis 10mg . Chronic,stable -Continue Cialis 10mg  as needed.

## 2023-03-05 ENCOUNTER — Other Ambulatory Visit: Payer: Self-pay | Admitting: Family Medicine

## 2023-03-05 DIAGNOSIS — K529 Noninfective gastroenteritis and colitis, unspecified: Secondary | ICD-10-CM

## 2023-03-05 NOTE — Telephone Encounter (Signed)
Requested Prescriptions  Pending Prescriptions Disp Refills   sucralfate (CARAFATE) 1 g tablet [Pharmacy Med Name: SUCRALFATE 1GM TABLETS] 360 tablet 0    Sig: TAKE 1 TABLET BY MOUTH FOUR TIMES DAILY WITH MEALS AND AT BEDTIME     Gastroenterology: Antiacids Passed - 03/05/2023  3:33 AM      Passed - Valid encounter within last 12 months    Recent Outpatient Visits           Yesterday Essential (primary) hypertension   [place] [facility] Simmons-Robinson, [place], MD   4 months ago Avitaminosis D   [place] [facility] Simmons-Robinson, [place], MD   7 months ago Essential (primary) hypertension   [place] [facility] [place], [place], MD   7 months ago Gastroenteritis   [facility] Merita Norton T, FNP   1 year ago Annual physical exam   [place] [facility] Bosie Clos, MD       Future Appointments             In 6 months Richardo Hanks, Laurette Schimke, MD [facility] [place]

## 2023-03-06 ENCOUNTER — Telehealth: Payer: Self-pay | Admitting: Family Medicine

## 2023-03-19 ENCOUNTER — Other Ambulatory Visit: Payer: Self-pay | Admitting: Family Medicine

## 2023-03-19 NOTE — Telephone Encounter (Signed)
Medication Refill - Medication: tadalafil (CIALIS) 20 MG tablet   Has the patient contacted their pharmacy? Yes.   (Agent: If no, request that the patient contact the pharmacy for the refill. If patient does not wish to contact the pharmacy document the reason why and proceed with request.) (Agent: If yes, when and what did the pharmacy advise?)  Preferred Pharmacy (with phone number or street name):  [id number] [place], [place] - [address] AT [facility] [address]  [address] [place] [address]  Phone: [phone] Fax: [phone]   Has the patient been seen for an appointment in the last year OR does the patient have an upcoming appointment? Yes.    Agent: Please be advised that RX refills may take up to 3 business days. We ask that you follow-up with your pharmacy.

## 2023-03-20 ENCOUNTER — Encounter: Payer: Commercial Managed Care - PPO | Admitting: Family Medicine

## 2023-03-20 MED ORDER — TADALAFIL 20 MG PO TABS: 20.0000 mg | ORAL_TABLET | Freq: Every day | ORAL | 6 refills | Status: DC | PRN

## 2023-03-20 NOTE — Telephone Encounter (Signed)
Requested Prescriptions  Pending Prescriptions Disp Refills   tadalafil (CIALIS) 20 MG tablet 30 tablet 6    Sig: Take 1 tablet (20 mg total) by mouth daily as needed for erectile dysfunction.     Urology: Erectile Dysfunction Agents Failed - 03/19/2023  9:25 AM      Failed - Last BP in normal range    BP Readings from Last 1 Encounters:  03/04/23 (!) 140/80         Passed - AST in normal range and within 360 days    AST  Date Value Ref Range Status  07/11/2022 22 0 - 40 IU/L Final         Passed - ALT in normal range and within 360 days    ALT  Date Value Ref Range Status  07/11/2022 18 0 - 44 IU/L Final         Passed - Valid encounter within last 12 months    Recent Outpatient Visits           2 weeks ago Essential (primary) hypertension   [place] [facility] Simmons-Robinson, [place], MD   5 months ago Avitaminosis D   [place] [facility] Simmons-Robinson, [place], MD   7 months ago Essential (primary) hypertension   [place] [facility] [place], [place], MD   8 months ago Gastroenteritis   [facility] Merita Norton T, FNP   1 year ago Annual physical exam   [place] [facility] Bosie Clos, MD       Future Appointments             In 6 months Richardo Hanks, Laurette Schimke, MD [facility] [place]

## 2023-04-04 NOTE — Progress Notes (Unsigned)
Annual Wellness Visit     Patient: Robert Patton, Male    DOB: November 13, 1950, 72 y.o.   MRN: [id number] Visit Date: 04/07/2023  Today's Provider: [name], MD   No chief complaint on file.  Subjective    Robert Patton is a 72 y.o. male who presents today for his Annual Wellness Visit.  He reports consuming a {diet types:17450} diet.  {Exercise:19826} He generally feels {well/fairly well/poorly:18703}.  He reports sleeping {well/fairly well/poorly:18703}.    He {does/does not:200015} have additional problems to discuss today.    Medications: Outpatient Medications Prior to Visit  Medication Sig   acetaminophen (TYLENOL) 500 MG tablet Take 1,000 mg by mouth every 6 (six) hours as needed (FOR PAIN.).   amLODipine (NORVASC) 5 MG tablet TAKE 1 TABLET(5 MG) BY MOUTH DAILY   bismuth subsalicylate (PEPTO BISMOL) 262 MG chewable tablet Chew 2 tablets (524 mg total) by mouth 4 (four) times daily -  before meals and at bedtime.   Calcium-Magnesium-Zinc (CAL-MAG-ZINC PO) Take 1 tablet by mouth daily.   carboxymethylcellulose 1 % ophthalmic solution Place 1-2 drops into both eyes 2 (two) times daily.   chlorthalidone (HYGROTON) 50 MG tablet TAKE 1 TABLET(50 MG) BY MOUTH EVERY MORNING   gabapentin (NEURONTIN) 100 MG capsule Take 100 mg by mouth 2 (two) times daily.   losartan (COZAAR) 100 MG tablet Take 0.5 tablets (50 mg total) by mouth daily.   MOBIC 15 MG tablet Take 15 mg by mouth daily.   Multiple Vitamin (MULTIVITAMIN WITH MINERALS) TABS tablet Take 1 tablet by mouth daily.   pantoprazole (PROTONIX) 40 MG tablet TAKE 1 TABLET(40 MG) BY MOUTH TWICE DAILY BEFORE A MEAL   sucralfate (CARAFATE) 1 g tablet TAKE 1 TABLET BY MOUTH FOUR TIMES DAILY WITH MEALS AND AT BEDTIME   tadalafil (CIALIS) 20 MG tablet Take 1 tablet (20 mg total) by mouth daily as needed for erectile dysfunction.   No facility-administered medications prior to visit.    No Known Allergies  Patient  Care Team: [name], MD as PCP - General (Family Medicine) Toney Reil, MD as Consulting Physician (Gastroenterology) Orson Ape, MD as Consulting Physician (Urology)  Review of Systems  {Insert previous labs (optional):23779} {See past labs  Heme  Chem  Endocrine  Serology  Results Review (optional):1}    Objective    Vitals: There were no vitals taken for this visit. {Insert last BP/Wt (optional):23777}{See vitals history (optional):1}    Physical Exam ***  Most recent functional status assessment:    08/01/2022   11:17 AM  In your present state of health, do you have any difficulty performing the following activities:  Hearing? 0  Vision? 0  Difficulty concentrating or making decisions? 1  Comment "cataract"  Walking or climbing stairs? 0  Dressing or bathing? 0  Doing errands, shopping? 0   Most recent fall risk assessment:    08/01/2022   11:16 AM  Fall Risk   Falls in the past year? 0  Number falls in past yr: 0  Injury with Fall? 0  Risk for fall due to : No Fall Risks    Most recent depression screenings:    08/01/2022   11:17 AM 03/18/2022   10:06 AM  PHQ 2/9 Scores  PHQ - 2 Score 0 0  PHQ- 9 Score  0   Most recent cognitive screening:    03/18/2022   10:10 AM  6CIT Screen  What Year? 0 points  What  month? 0 points  What time? 0 points  Count back from 20 0 points  Months in reverse 0 points  Repeat phrase 0 points  Total Score 0 points   Most recent Audit-C alcohol use screening    03/18/2022   10:04 AM  Alcohol Use Disorder Test (AUDIT)  1. How often do you have a drink containing alcohol? 0  2. How many drinks containing alcohol do you have on a typical day when you are drinking? 0  3. How often do you have six or more drinks on one occasion? 0  AUDIT-C Score 0   A score of 3 or more in women, and 4 or more in men indicates increased risk for alcohol abuse, EXCEPT if all of the points are from  question 1       03/18/2022   10:07 AM  Advanced Directives  Does Patient Have a Medical Advance Directive? No  Would patient like information on creating a medical advance directive? No - Patient declined      No results found for any visits on 04/07/23.  Assessment & Plan     Problem List Items Addressed This Visit   None    Immunization History  Administered Date(s) Administered   Fluad Quad(high Dose 65+) 03/11/2019, 03/14/2020   Fluad Trivalent(High Dose 65+) 03/04/2023   Influenza Split 04/08/2011   Influenza, High Dose Seasonal PF 02/19/2018   Influenza-Unspecified 02/12/2021   Moderna SARS-COV2 Booster Vaccination 02/12/2021   Moderna Sars-Covid-2 Vaccination 07/05/2019, 08/02/2019   Pneumococcal Conjugate-13 07/20/2014   Pneumococcal Polysaccharide-23 02/19/2018   Tdap 04/08/2011   Zoster, Live 12/24/2011    Health Maintenance  Topic Date Due   Zoster Vaccines- Shingrix (1 of 2) 05/08/1999   DTaP/Tdap/Td (2 - Td or Tdap) 04/07/2021   COVID-19 Vaccine (4 - 2023-24 season) 01/26/2023   Medicare Annual Wellness (AWV)  03/19/2023   Colonoscopy  10/07/2029   Pneumonia Vaccine 68+ Years old  Completed   INFLUENZA VACCINE  Completed   Hepatitis C Screening  Completed   HPV VACCINES  Aged Out     No follow-ups on file.       [name], MD  [facility] [phone] (phone) [phone] (fax)  [facility]

## 2023-04-07 ENCOUNTER — Ambulatory Visit (INDEPENDENT_AMBULATORY_CARE_PROVIDER_SITE_OTHER): Payer: Medicare Other | Admitting: Family Medicine

## 2023-04-07 ENCOUNTER — Encounter: Payer: Self-pay | Admitting: Family Medicine

## 2023-04-07 VITALS — BP 130/76 | HR 77 | Temp 98.2°F | Ht 70.0 in | Wt 168.2 lb

## 2023-04-07 DIAGNOSIS — E559 Vitamin D deficiency, unspecified: Secondary | ICD-10-CM | POA: Diagnosis not present

## 2023-04-07 DIAGNOSIS — I739 Peripheral vascular disease, unspecified: Secondary | ICD-10-CM

## 2023-04-07 DIAGNOSIS — Z130 Encounter for screening for diseases of the blood and blood-forming organs and certain disorders involving the immune mechanism: Secondary | ICD-10-CM | POA: Diagnosis not present

## 2023-04-07 DIAGNOSIS — I10 Essential (primary) hypertension: Secondary | ICD-10-CM | POA: Diagnosis not present

## 2023-04-07 DIAGNOSIS — Z13220 Encounter for screening for lipoid disorders: Secondary | ICD-10-CM

## 2023-04-07 DIAGNOSIS — N529 Male erectile dysfunction, unspecified: Secondary | ICD-10-CM

## 2023-04-07 DIAGNOSIS — Z0000 Encounter for general adult medical examination without abnormal findings: Secondary | ICD-10-CM

## 2023-04-07 DIAGNOSIS — Z131 Encounter for screening for diabetes mellitus: Secondary | ICD-10-CM

## 2023-04-07 NOTE — Assessment & Plan Note (Signed)
Annual wellness visit completed today including all of the following: -Reviewed patient's family medical history -Reviewed and updated patient's list of medical providers -Completed assessment of cognitive impairment -Completed assessment of patient's functional ability -Provided patient with recommendations for health screening services as well as vaccines Health risk assessment completed and reviewed -Discussed recommendations for well-balanced diet in addition to 150 minutes of physical activity per week  

## 2023-04-08 ENCOUNTER — Encounter: Payer: Self-pay | Admitting: Family Medicine

## 2023-04-08 LAB — CBC
Hematocrit: 41 % (ref 37.5–51.0)
Hemoglobin: 13.3 g/dL (ref 13.0–17.7)
MCH: 28.2 pg (ref 26.6–33.0)
MCHC: 32.4 g/dL (ref 31.5–35.7)
MCV: 87 fL (ref 79–97)
Platelets: 175 10*3/uL (ref 150–450)
RBC: 4.71 x10E6/uL (ref 4.14–5.80)
RDW: 13.1 % (ref 11.6–15.4)
WBC: 3.6 10*3/uL (ref 3.4–10.8)

## 2023-04-08 LAB — CMP14+EGFR
ALT: 23 [IU]/L (ref 0–44)
AST: 31 [IU]/L (ref 0–40)
Albumin: 4.1 g/dL (ref 3.8–4.8)
Alkaline Phosphatase: 89 [IU]/L (ref 44–121)
BUN/Creatinine Ratio: 11 (ref 10–24)
BUN: 10 mg/dL (ref 8–27)
Bilirubin Total: 0.3 mg/dL (ref 0.0–1.2)
CO2: 27 mmol/L (ref 20–29)
Calcium: 9.4 mg/dL (ref 8.6–10.2)
Chloride: 100 mmol/L (ref 96–106)
Creatinine, Ser: 0.88 mg/dL (ref 0.76–1.27)
Globulin, Total: 2.8 g/dL (ref 1.5–4.5)
Glucose: 84 mg/dL (ref 70–99)
Potassium: 3.5 mmol/L (ref 3.5–5.2)
Sodium: 140 mmol/L (ref 134–144)
Total Protein: 6.9 g/dL (ref 6.0–8.5)
eGFR: 91 mL/min/{1.73_m2} (ref >59)

## 2023-04-08 LAB — HEMOGLOBIN A1C
Est. average glucose Bld gHb Est-mCnc: 128 mg/dL
Hgb A1c MFr Bld: 6.1 % — ABNORMAL HIGH (ref 4.8–5.6)

## 2023-04-08 LAB — LIPID PANEL
Chol/HDL Ratio: 2.2 ratio (ref 0.0–5.0)
Cholesterol, Total: 169 mg/dL (ref 100–199)
HDL: 77 mg/dL (ref >39)
LDL Chol Calc (NIH): 80 mg/dL (ref 0–99)
Triglycerides: 61 mg/dL (ref 0–149)
VLDL Cholesterol Cal: 12 mg/dL (ref 5–40)

## 2023-04-08 LAB — VITAMIN D 25 HYDROXY (VIT D DEFICIENCY, FRACTURES): Vit D, 25-Hydroxy: 33.7 ng/mL (ref 30.0–100.0)

## 2023-04-15 ENCOUNTER — Telehealth: Payer: Self-pay | Admitting: *Deleted

## 2023-04-15 NOTE — Telephone Encounter (Signed)
Pt calling asking for refill of cialis. This medication has been sent in by his PCP 02/2023. Left message on VM with this information.

## 2023-04-30 ENCOUNTER — Other Ambulatory Visit: Payer: Self-pay | Admitting: Family Medicine

## 2023-04-30 ENCOUNTER — Telehealth: Payer: Self-pay | Admitting: Urology

## 2023-04-30 DIAGNOSIS — R609 Edema, unspecified: Secondary | ICD-10-CM | POA: Diagnosis not present

## 2023-04-30 DIAGNOSIS — M25571 Pain in right ankle and joints of right foot: Secondary | ICD-10-CM | POA: Diagnosis not present

## 2023-04-30 DIAGNOSIS — R202 Paresthesia of skin: Secondary | ICD-10-CM | POA: Diagnosis not present

## 2023-04-30 DIAGNOSIS — K529 Noninfective gastroenteritis and colitis, unspecified: Secondary | ICD-10-CM

## 2023-04-30 DIAGNOSIS — I10 Essential (primary) hypertension: Secondary | ICD-10-CM

## 2023-04-30 DIAGNOSIS — R200 Anesthesia of skin: Secondary | ICD-10-CM | POA: Diagnosis not present

## 2023-04-30 DIAGNOSIS — L539 Erythematous condition, unspecified: Secondary | ICD-10-CM | POA: Diagnosis not present

## 2023-05-01 NOTE — Telephone Encounter (Signed)
Requested Prescriptions  Pending Prescriptions Disp Refills   chlorthalidone (HYGROTON) 50 MG tablet [Pharmacy Med Name: CHLORTHALIDONE 50MG  TABLETS] 90 tablet 1    Sig: TAKE 1 TABLET(50 MG) BY MOUTH EVERY MORNING     Cardiovascular: Diuretics - Thiazide Passed - 04/30/2023  3:34 AM      Passed - Cr in normal range and within 180 days    Creat  Date Value Ref Range Status  02/04/2017 0.88 0.70 - 1.25 mg/dL Final    Comment:    For patients >74 years of age, the reference limit for Creatinine is approximately 13% higher for people identified as African-American. .    Creatinine, Ser  Date Value Ref Range Status  04/07/2023 0.88 0.76 - 1.27 mg/dL Final         Passed - K in normal range and within 180 days    Potassium  Date Value Ref Range Status  04/07/2023 3.5 3.5 - 5.2 mmol/L Final         Passed - Na in normal range and within 180 days    Sodium  Date Value Ref Range Status  04/07/2023 140 134 - 144 mmol/L Final         Passed - Last BP in normal range    BP Readings from Last 1 Encounters:  04/07/23 130/76         Passed - Valid encounter within last 6 months    Recent Outpatient Visits           3 weeks ago Encounter for annual wellness visit (AWV) in Medicare patient   [facility] Simmons-Robinson, [place], MD   1 month ago Essential (primary) hypertension   [facility] Simmons-Robinson, [place], MD   6 months ago Avitaminosis D   [place] [facility] Simmons-Robinson, [place], MD   9 months ago Essential (primary) hypertension   [place] [facility] Simmons-Robinson, [place], MD   9 months ago Gastroenteritis   [place] [facility] Merita Norton T, FNP       Future Appointments             In 4 months Richardo Hanks, Laurette Schimke, MD [facility] [place]   In 5 months Simmons-Robinson, Tawanna Cooler, MD [facility]              pantoprazole (PROTONIX) 40 MG tablet [Pharmacy Med Name: PANTOPRAZOLE 40MG  TABLETS] 180 tablet 3    Sig: TAKE 1 TABLET(40 MG) BY MOUTH TWICE DAILY BEFORE A MEAL     Gastroenterology: Proton Pump Inhibitors Passed - 04/30/2023  3:34 AM      Passed - Valid encounter within last 12 months    Recent Outpatient Visits           3 weeks ago Encounter for annual wellness visit (AWV) in Medicare patient   [place] Simmons-Robinson, [place], MD   1 month ago Essential (primary) hypertension   [place] [facility] Simmons-Robinson, [place], MD   6 months ago Avitaminosis D   [place] [facility] Simmons-Robinson, [place], MD   9 months ago Essential (primary) hypertension   [place] [facility] Offerman, [place], MD   9 months ago Gastroenteritis   [facility] Jacky Kindle, FNP       Future Appointments             In 4 months Sondra Come,  MD [facility] [place]   In 5 months Simmons-Robinson, Tawanna Cooler, MD [facility] [place]

## 2023-05-02 ENCOUNTER — Other Ambulatory Visit: Payer: Self-pay | Admitting: Family Medicine

## 2023-05-02 NOTE — Telephone Encounter (Signed)
Medication Refill -  Most Recent Primary Care Visit:  Provider: [name]  Department: [facility]  Visit Type: MEDICARE AWV, SEQUENTIAL  Date: 04/07/2023  Medication: tadalafil (CIALIS) 20 MG tablet [16109]   Has the patient contacted their pharmacy? Yes Is this the correct pharmacy for this prescription? Yes If no, delete pharmacy and type the correct one.  This is the patient's preferred pharmacy:  [id number] [place], [place] - [address] AT [facility] OF [address] & [address] [place] [address] Phone: [phone] Fax: [phone]   Has the prescription been filled recently? Yes  Is the patient out of the medication? Yes  Has the patient been seen for an appointment in the last year OR does the patient have an upcoming appointment? Yes  Can we respond through MyChart? No  Agent: Please be advised that Rx refills may take up to 3 business days. We ask that you follow-up with your pharmacy.

## 2023-05-05 NOTE — Telephone Encounter (Signed)
Called pharmacy - refills available. They will prepare a refill for pt.

## 2023-05-05 NOTE — Telephone Encounter (Signed)
Requested Prescriptions  Refused Prescriptions Disp Refills   tadalafil (CIALIS) 20 MG tablet 30 tablet 6    Sig: Take 1 tablet (20 mg total) by mouth daily as needed for erectile dysfunction.     Urology: Erectile Dysfunction Agents Passed - 05/02/2023 11:28 AM      Passed - AST in normal range and within 360 days    AST  Date Value Ref Range Status  04/07/2023 31 0 - 40 IU/L Final         Passed - ALT in normal range and within 360 days    ALT  Date Value Ref Range Status  04/07/2023 23 0 - 44 IU/L Final         Passed - Last BP in normal range    BP Readings from Last 1 Encounters:  04/07/23 130/76         Passed - Valid encounter within last 12 months    Recent Outpatient Visits           4 weeks ago Encounter for annual wellness visit (AWV) in Medicare patient   [place] [facility] Simmons-Robinson, [place], MD   2 months ago Essential (primary) hypertension   [place] [facility] Simmons-Robinson, [place], MD   6 months ago Avitaminosis D   [place] [facility] Simmons-Robinson, [place], MD   9 months ago Essential (primary) hypertension   [place] [facility] [place], [place], MD   9 months ago Gastroenteritis   [place] [facility] Merita Norton T, FNP       Future Appointments             In 4 months Richardo Hanks, Laurette Schimke, MD [facility] [place]   In 5 months Simmons-Robinson, Tawanna Cooler, MD [facility]

## 2023-08-28 ENCOUNTER — Telehealth: Payer: Self-pay | Admitting: Family Medicine

## 2023-08-28 DIAGNOSIS — N529 Male erectile dysfunction, unspecified: Secondary | ICD-10-CM

## 2023-08-28 MED ORDER — SILDENAFIL CITRATE 25 MG PO TABS: 25.0000 mg | ORAL_TABLET | Freq: Every day | ORAL | 3 refills | Status: DC | PRN

## 2023-08-28 NOTE — Telephone Encounter (Signed)
 Will send in prescription for Viagra to replace cialis

## 2023-08-28 NOTE — Addendum Note (Signed)
 Addended by: Bing Neighbors on: 08/28/2023 03:17 PM   Modules accepted: Orders

## 2023-08-28 NOTE — Telephone Encounter (Signed)
 Copied from CRM [id number]. Topic: Clinical - Medication Question >> Aug 28, 2023  9:11 AM Higinio Roger wrote: Reason for CRM: Patient is requesting Viagra.  Callback #: [id number]  Preferred Pharmacy:  [id number] [place], [place] - [address] AT [facility] [address] [place] [address]GNFAO: [phone] Fax: [phone]: Not open 24 hours

## 2023-09-12 ENCOUNTER — Other Ambulatory Visit: Payer: Commercial Managed Care - PPO

## 2023-09-15 ENCOUNTER — Other Ambulatory Visit: Payer: Self-pay

## 2023-09-15 DIAGNOSIS — R972 Elevated prostate specific antigen [PSA]: Secondary | ICD-10-CM | POA: Diagnosis not present

## 2023-09-16 LAB — PSA: Prostate Specific Ag, Serum: 6 ng/mL — ABNORMAL HIGH (ref 0.0–4.0)

## 2023-09-18 ENCOUNTER — Ambulatory Visit: Payer: Commercial Managed Care - PPO | Admitting: Urology

## 2023-09-25 ENCOUNTER — Ambulatory Visit (INDEPENDENT_AMBULATORY_CARE_PROVIDER_SITE_OTHER): Payer: Self-pay | Admitting: Urology

## 2023-09-25 VITALS — BP 161/75 | HR 86 | Ht 70.0 in | Wt 160.8 lb

## 2023-09-25 DIAGNOSIS — N529 Male erectile dysfunction, unspecified: Secondary | ICD-10-CM | POA: Diagnosis not present

## 2023-09-25 DIAGNOSIS — Z125 Encounter for screening for malignant neoplasm of prostate: Secondary | ICD-10-CM | POA: Diagnosis not present

## 2023-09-25 DIAGNOSIS — R972 Elevated prostate specific antigen [PSA]: Secondary | ICD-10-CM

## 2023-09-25 MED ORDER — SILDENAFIL CITRATE 25 MG PO TABS: 25.0000 mg | ORAL_TABLET | Freq: Every day | ORAL | 11 refills | Status: AC | PRN

## 2023-09-25 NOTE — Progress Notes (Signed)
   09/25/2023 9:24 AM   Robert Patton 14-Dec-1950 [id number]  Reason for visit: Follow up elevated PSA, ED, left hydrocele  HPI: 73 year old male previously followed by Dr. Sullivan Endow for the above issues and transferred his care to [facility] urology in April 2024.  He has a long history of an elevated PSA ranging from 5-6.5, and has undergone extensive workup with Dr. Sullivan Endow.  He had a negative prostate biopsy in 2019, prostate MRI March 2023 with an 80 g prostate and a very small PI-RADS 3 lesion, and negative MRI fusion biopsy.  PSA has been relatively stable since that time, and PSA density very reassuring at 0.06.  PSA April 2025 stable at 6.0, and really stable over the last 10 years.  Reassurance provided, we reviewed the AUA guidelines that do not recommend routine screening in men over age 64, and with his extensive negative workup I think it is very reasonable to discontinue PSA screening.  In terms of ED, he was previously on Cialis  but it looks like his PCP changed him to sildenafil .  He feels like this works well.  We discussed max dose would be 100 mg, and improved efficacy if taken on an empty stomach.  This was refilled.  He has a history of a left hydrocele that was aspirated multiple times by Dr. Sullivan Endow in clinic.  He denies any changes or problems over the last year, this is not bothersome.  On exam very small left hydrocele.  He opts for continued observation.  No further PSA screening needed per guideline recommendations Sildenafil  for ED, dose increased to 25-100 mg on demand RTC 1 year symptom check  Lawerence Pressman, MD  [facility] [address], [address] [place], [place] [address] [phone]

## 2023-10-06 ENCOUNTER — Encounter: Payer: Self-pay | Admitting: Family Medicine

## 2023-10-06 ENCOUNTER — Ambulatory Visit (INDEPENDENT_AMBULATORY_CARE_PROVIDER_SITE_OTHER): Payer: Self-pay | Admitting: Family Medicine

## 2023-10-06 VITALS — BP 114/64 | HR 76 | Ht 70.0 in | Wt 166.0 lb

## 2023-10-06 DIAGNOSIS — J309 Allergic rhinitis, unspecified: Secondary | ICD-10-CM

## 2023-10-06 DIAGNOSIS — I739 Peripheral vascular disease, unspecified: Secondary | ICD-10-CM

## 2023-10-06 DIAGNOSIS — I10 Essential (primary) hypertension: Secondary | ICD-10-CM

## 2023-10-06 DIAGNOSIS — N529 Male erectile dysfunction, unspecified: Secondary | ICD-10-CM | POA: Diagnosis not present

## 2023-10-06 DIAGNOSIS — E559 Vitamin D deficiency, unspecified: Secondary | ICD-10-CM | POA: Diagnosis not present

## 2023-10-06 DIAGNOSIS — L84 Corns and callosities: Secondary | ICD-10-CM

## 2023-10-06 NOTE — Progress Notes (Signed)
 Established patient visit   Patient: Robert Patton   DOB: Nov 24, 1950   73 y.o. Male  MRN: [id number] Visit Date: 10/06/2023  Today's healthcare provider: Mimi Alt, MD   Chief Complaint  Patient presents with   Hypertension   Subjective       Discussed the use of AI scribe software for clinical note transcription with the patient, who gave verbal consent to proceed.  History of Present Illness Robert Patton is a 73 year old male with hypertension, peripheral arterial disease, and erectile dysfunction who presents for a chronic follow-up.  He experiences variable blood pressure readings at home, with occasional readings as low as 111/69 mmHg. He sometimes skips his medication when he feels his blood pressure is too low. His current medications include amlodipine  5 mg daily and chlorthalidone  50 mg daily. He discontinued losartan  due to concerns about its effects on his sexual function.  He has a painful left foot, specifically the PT toe, which has been present for a few weeks and is exacerbated by wearing shoes. A podiatrist previously shaved the area to relieve discomfort. There are no similar issues with the right foot.  For peripheral arterial disease, he experiences numbness in the heel but no significant pain. He is prescribed gabapentin as needed for pain management. No pain in his legs related to peripheral arterial disease.  Regarding erectile dysfunction, he uses sildenafil  (Viagra ) 25 mg as needed. A urologist confirmed that his prostate levels are normal.  He occasionally uses Protonix  40 mg for reflux symptoms, particularly after consuming spicy foods or overeating, which effectively manages his symptoms.  He exercises regularly at the [facility] with his wife and maintains an active lifestyle.     Past Medical History:  Diagnosis Date   Arthritis    ED (erectile dysfunction)    Hyperlipidemia    Hypertension     Medications: Outpatient  Medications Prior to Visit  Medication Sig   acetaminophen  (TYLENOL ) 500 MG tablet Take 1,000 mg by mouth every 6 (six) hours as needed (FOR PAIN.).   amLODipine  (NORVASC ) 5 MG tablet TAKE 1 TABLET(5 MG) BY MOUTH DAILY   bismuth  subsalicylate (PEPTO BISMOL) 262 MG chewable tablet Chew 2 tablets (524 mg total) by mouth 4 (four) times daily -  before meals and at bedtime.   Calcium-Magnesium-Zinc (CAL-MAG-ZINC PO) Take 1 tablet by mouth daily.   carboxymethylcellulose 1 % ophthalmic solution Place 1-2 drops into both eyes 2 (two) times daily.   chlorthalidone  (HYGROTON ) 50 MG tablet TAKE 1 TABLET(50 MG) BY MOUTH EVERY MORNING   gabapentin (NEURONTIN) 100 MG capsule Take 100 mg by mouth 2 (two) times daily.   Multiple Vitamin (MULTIVITAMIN WITH MINERALS) TABS tablet Take 1 tablet by mouth daily.   pantoprazole  (PROTONIX ) 40 MG tablet TAKE 1 TABLET(40 MG) BY MOUTH TWICE DAILY BEFORE A MEAL   sildenafil  (VIAGRA ) 25 MG tablet Take 1-4 tablets (25-100 mg total) by mouth daily as needed for erectile dysfunction (take 45 minutes prior to sexual activity).   sucralfate  (CARAFATE ) 1 g tablet TAKE 1 TABLET BY MOUTH FOUR TIMES DAILY WITH MEALS AND AT BEDTIME   [DISCONTINUED] losartan  (COZAAR ) 100 MG tablet Take 0.5 tablets (50 mg total) by mouth daily.   [DISCONTINUED] MOBIC  15 MG tablet Take 15 mg by mouth daily. (Patient not taking: Reported on 10/06/2023)   No facility-administered medications prior to visit.    Review of Systems  Last CBC Lab Results  Component Value Date  WBC 3.6 04/07/2023   HGB 13.3 04/07/2023   HCT 41.0 04/07/2023   MCV 87 04/07/2023   MCH 28.2 04/07/2023   RDW 13.1 04/07/2023   PLT 175 04/07/2023   Last metabolic panel Lab Results  Component Value Date   GLUCOSE 84 04/07/2023   NA 140 04/07/2023   K 3.5 04/07/2023   CL 100 04/07/2023   CO2 27 04/07/2023   BUN 10 04/07/2023   CREATININE 0.88 04/07/2023   EGFR 91 04/07/2023   CALCIUM 9.4 04/07/2023   PROT 6.9  04/07/2023   ALBUMIN 4.1 04/07/2023   LABGLOB 2.8 04/07/2023   AGRATIO 1.7 07/11/2022   BILITOT 0.3 04/07/2023   ALKPHOS 89 04/07/2023   AST 31 04/07/2023   ALT 23 04/07/2023   ANIONGAP 8 11/01/2021   Last lipids Lab Results  Component Value Date   CHOL 169 04/07/2023   HDL 77 04/07/2023   LDLCALC 80 04/07/2023   TRIG 61 04/07/2023   CHOLHDL 2.2 04/07/2023  The 10-year ASCVD risk score (Arnett DK, et al., 2019) is: 14.9%   Last hemoglobin A1c Lab Results  Component Value Date   HGBA1C 6.1 (H) 04/07/2023   Last thyroid  functions Lab Results  Component Value Date   TSH 1.850 01/30/2022        Objective    BP 114/64 (Cuff Size: Normal)   Pulse 76   Ht 5\' 10"  (1.778 m)   Wt 166 lb (75.3 kg)   SpO2 98%   BMI 23.82 kg/m  BP Readings from Last 3 Encounters:  10/06/23 114/64  09/25/23 (!) 161/75  04/07/23 130/76   Wt Readings from Last 3 Encounters:  10/06/23 166 lb (75.3 kg)  09/25/23 160 lb 12.8 oz (72.9 kg)  04/07/23 168 lb 3.2 oz (76.3 kg)        Physical Exam  General: Alert, no acute distress Cardio: Normal S1 and S2, RRR, no r/m/g Pulm: CTAB, normal work of breathing ABD: soft, abdomen is not distended, there is no tenderness to palpation, normal BS  Extremities: no LE edema  Foot: left 5th digit with callus on lateral aspect of digit, tender to palpation, no bleeding, no surrounding erythema   No results found for any visits on 10/06/23.  Assessment & Plan     Problem List Items Addressed This Visit       Cardiovascular and Mediastinum   Peripheral vascular disease (HCC)   Essential (primary) hypertension - Primary   Essential (primary) hypertension, chronic Blood pressure is better controlled with amlodipine  5 mg daily and chlorthalidone  50 mg daily. Losartan  was discontinued due to concerns about erectile function. Home readings are generally within target range, though occasionally low, leading to self-discontinuation of medication. Office  reading today was 114/64, within target range. - Continue amlodipine  5 mg daily - Continue chlorthalidone  50 mg daily DC losartan  - Monitor blood pressure regularly at home - Recheck blood pressure in office        Respiratory   Allergic rhinitis     Other   ED (erectile dysfunction) of organic origin   Avitaminosis D   Other Visit Diagnoses       Callus of toe       Relevant Orders   Ambulatory referral to Podiatry        Assessment & Plan   Left toe callus  PAD No current pain or significant symptoms. Occasional numbness in the heel. Gabapentin is used as needed for symptoms. Left foot pain due to a corn  was managed by podiatry with trimming. Referral to podiatry for further management is planned. - Continue gabapentin as needed for symptoms - Refer to podiatry for left foot pain and management  Erectile dysfunction Managed with sildenafil  25 mg as needed. Losartan  was discontinued due to concerns about erectile function. Recent urology evaluation was satisfactory with normal prostate levels. - Continue sildenafil  25 mg as needed - Follow up with urology as needed  Gastroesophageal reflux disease without esophagitis Symptoms occur with spicy foods or overeating. Managed with Protonix  40 mg as needed, effectively controlling symptoms. - Continue Protonix  40 mg as needed for reflux symptoms  General Health Maintenance Discussed need for tetanus booster and shingles vaccine. Advised to obtain these at the pharmacy for cost-effectiveness with Medicare. Next wellness visit is due and will be scheduled. - Discuss tetanus booster and shingles vaccine with pharmacist - Schedule wellness visit for Medicare     Return in about 6 months (around 04/07/2024) for AWV.         Mimi Alt, MD  [facility] [phone] (phone) [phone] (fax)  [facility]

## 2023-10-06 NOTE — Assessment & Plan Note (Signed)
 Essential (primary) hypertension, chronic Blood pressure is better controlled with amlodipine  5 mg daily and chlorthalidone  50 mg daily. Losartan  was discontinued due to concerns about erectile function. Home readings are generally within target range, though occasionally low, leading to self-discontinuation of medication. Office reading today was 114/64, within target range. - Continue amlodipine  5 mg daily - Continue chlorthalidone  50 mg daily DC losartan  - Monitor blood pressure regularly at home - Recheck blood pressure in office

## 2023-10-06 NOTE — Patient Instructions (Addendum)
 Recommended Vaccines - please discuss these vaccines with your pharmacist -Tetanus booster  -Shingrix     VISIT SUMMARY: You came in today for a follow-up on your chronic conditions, including hypertension, peripheral arterial disease, and erectile dysfunction. We discussed your current symptoms, medications, and any concerns you have. Your blood pressure readings at home have been variable, and you sometimes skip your medication when you feel your blood pressure is too low. You also mentioned pain in your left foot and occasional reflux symptoms.  YOUR PLAN: -ESSENTIAL (PRIMARY) HYPERTENSION: Hypertension, or high blood pressure, is being managed with amlodipine  5 mg daily and chlorthalidone  50 mg daily. Your home readings are generally within the target range, though occasionally low. Continue taking your medications as prescribed and monitor your blood pressure regularly at home. We will recheck your blood pressure in the office at your next visit.  -PERIPHERAL ARTERY DISEASE: Peripheral artery disease is a condition where the blood vessels outside your heart and brain narrow, reducing blood flow. You have occasional numbness in your heel and left foot pain due to a corn. Continue using gabapentin as needed for symptoms, and we will refer you to a podiatrist for further management of your left foot pain.  -ERECTILE DYSFUNCTION: Erectile dysfunction is the inability to get or keep an erection firm enough for sex. It is being managed with sildenafil  25 mg as needed. You discontinued losartan  due to concerns about its effects on your sexual function. Continue using sildenafil  as needed and follow up with urology as necessary.  -GASTROESOPHAGEAL REFLUX DISEASE WITHOUT ESOPHAGITIS: Gastroesophageal reflux disease (GERD) is a condition where stomach acid frequently flows back into the tube connecting your mouth and stomach. Your symptoms occur with spicy foods or overeating and are managed with Protonix   40 mg as needed. Continue taking Protonix  as needed to control your symptoms.  -GENERAL HEALTH MAINTENANCE: We discussed the need for a tetanus booster and shingles vaccine. Please talk to your pharmacist about getting these vaccines, as it may be more cost-effective with Medicare. Your next wellness visit is due and will be scheduled.  INSTRUCTIONS: Please monitor your blood pressure regularly at home and continue taking your medications as prescribed. Follow up with the podiatrist for your left foot pain. Discuss getting a tetanus booster and shingles vaccine with your pharmacist. We will recheck your blood pressure in the office at your next visit. Schedule your next wellness visit for Medicare.                      Contains text generated by Abridge.                                 Contains text generated by Abridge.

## 2023-10-13 DIAGNOSIS — M2042 Other hammer toe(s) (acquired), left foot: Secondary | ICD-10-CM | POA: Diagnosis not present

## 2023-11-05 ENCOUNTER — Other Ambulatory Visit: Payer: Self-pay | Admitting: Family Medicine

## 2023-11-05 DIAGNOSIS — I10 Essential (primary) hypertension: Secondary | ICD-10-CM

## 2023-11-06 NOTE — Telephone Encounter (Signed)
 Requested Prescriptions  Pending Prescriptions Disp Refills   chlorthalidone  (HYGROTON ) 50 MG tablet [Pharmacy Med Name: CHLORTHALIDONE  50MG  TABLETS] 90 tablet 1    Sig: TAKE 1 TABLET(50 MG) BY MOUTH EVERY MORNING     Cardiovascular: Diuretics - Thiazide Failed - 11/06/2023  1:24 PM      Failed - Cr in normal range and within 180 days    Creat  Date Value Ref Range Status  02/04/2017 0.88 0.70 - 1.25 mg/dL Final    Comment:    For patients >48 years of age, the reference limit for Creatinine is approximately 13% higher for people identified as African-American. .    Creatinine, Ser  Date Value Ref Range Status  04/07/2023 0.88 0.76 - 1.27 mg/dL Final         Failed - K in normal range and within 180 days    Potassium  Date Value Ref Range Status  04/07/2023 3.5 3.5 - 5.2 mmol/L Final         Failed - Na in normal range and within 180 days    Sodium  Date Value Ref Range Status  04/07/2023 140 134 - 144 mmol/L Final         Passed - Last BP in normal range    BP Readings from Last 1 Encounters:  10/06/23 114/64         Passed - Valid encounter within last 6 months    Recent Outpatient Visits           1 month ago Essential (primary) hypertension   [place] [facility] Simmons-Robinson, Judyann Number, MD       Future Appointments             In 10 months Estanislao Heimlich, Dennard Fisher, MD [facility] [place]

## 2023-11-08 ENCOUNTER — Other Ambulatory Visit: Payer: Self-pay | Admitting: Family Medicine

## 2023-11-08 DIAGNOSIS — I10 Essential (primary) hypertension: Secondary | ICD-10-CM

## 2024-01-08 DIAGNOSIS — H25812 Combined forms of age-related cataract, left eye: Secondary | ICD-10-CM | POA: Diagnosis not present

## 2024-01-28 ENCOUNTER — Encounter: Payer: Self-pay | Admitting: Urology

## 2024-02-06 DIAGNOSIS — H25812 Combined forms of age-related cataract, left eye: Secondary | ICD-10-CM | POA: Diagnosis not present

## 2024-02-06 DIAGNOSIS — I10 Essential (primary) hypertension: Secondary | ICD-10-CM | POA: Diagnosis not present

## 2024-02-07 ENCOUNTER — Other Ambulatory Visit: Payer: Self-pay | Admitting: Family Medicine

## 2024-02-07 DIAGNOSIS — I10 Essential (primary) hypertension: Secondary | ICD-10-CM

## 2024-03-07 IMAGING — MR MR PROSTATE WO/W CM
56 series · 56 of 56 positions shown · IV contrast (gadavist)
Comparison: None.

CLINICAL DATA: Elevated PSA level. R97.20. Biopsy of 03/20/2018 was
benign.

EXAM:
MR PROSTATE WITHOUT AND WITH CONTRAST
TECHNIQUE: Multiplanar multisequence MRI images were obtained of the pelvis
centered about the prostate. Pre and post contrast images were
obtained.
CONTRAST:  7mL GADAVIST GADOBUTROL 1 MMOL/ML IV SOLN

[Series 3: ax in&out whole · axial · 3.0mm · 1.19mm/px · 1 of 88 slices shown (1 of 2)]
[im 1/88]
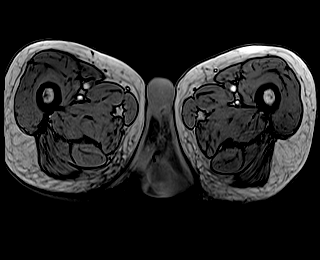

[Series 4: ax in&out whole · axial · 3.0mm · 1.19mm/px · 1 of 88 slices shown (2 of 2)]
[im 1/88]
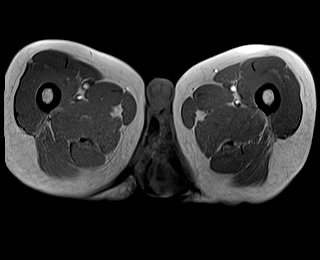

[Series 6: T2 · axial · 3.0mm · 0.56mm/px · 1 of 25 slices shown (1 of 3)]
[im 1/25]
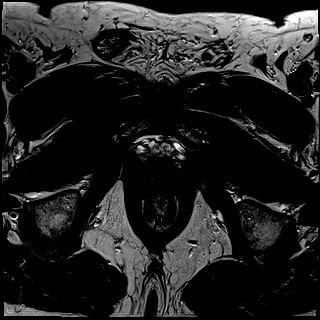

[Series 7: DWI · axial · 3.0mm · 0.86mm/px · 1 of 75 slices shown (1 of 3)]
[im 1/75]
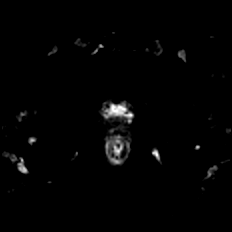

[Series 8: DWI · axial · 3.0mm · 0.86mm/px · 1 of 25 slices shown (2 of 3)]
[im 1/25]
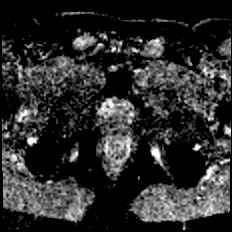

[Series 9: DWI · axial · 3.0mm · 0.86mm/px · 1 of 25 slices shown (3 of 3)]
[im 1/25]
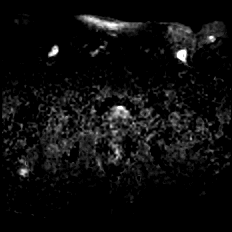

[Series 10: T2 · axial · 1.0mm · 1.04mm/px · 1 of 72 slices shown (2 of 3)]
[im 1/72]
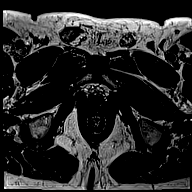

[Series 11: T2 · coronal · 3.0mm · 0.70mm/px · 1 of 35 slices shown (3 of 3)]
[im 1/35]
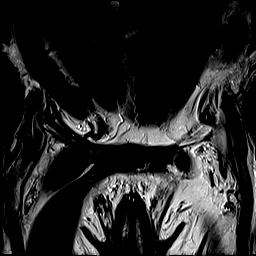

[Series 12: T1 · axial · 3.0mm · 1.15mm/px · 1 of 28 slices shown (1 of 48)]
[im 1/28]
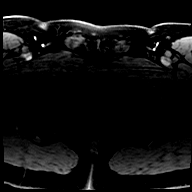

[Series 13: T1 · axial · 3.0mm · 1.15mm/px · 1 of 28 slices shown (2 of 48)]
[im 1/28]
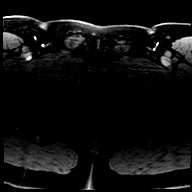

[Series 14: T1 · axial · 3.0mm · 1.15mm/px · 1 of 28 slices shown (3 of 48)]
[im 1/28]
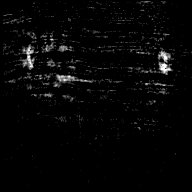

[Series 15: T1 · axial · 3.0mm · 1.15mm/px · 1 of 28 slices shown (4 of 48)]
[im 1/28]
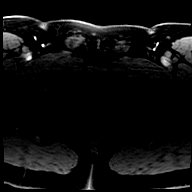

[Series 16: T1 · axial · 3.0mm · 1.15mm/px · 1 of 28 slices shown (5 of 48)]
[im 1/28]
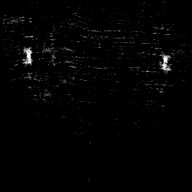

[Series 17: T1 · axial · 3.0mm · 1.15mm/px · 1 of 28 slices shown (6 of 48)]
[im 1/28]
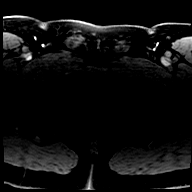

[Series 18: T1 · axial · 3.0mm · 1.15mm/px · 1 of 28 slices shown (7 of 48)]
[im 1/28]
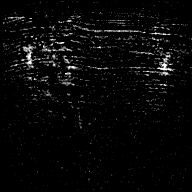

[Series 19: T1 · axial · 3.0mm · 1.15mm/px · 1 of 28 slices shown (8 of 48)]
[im 1/28]
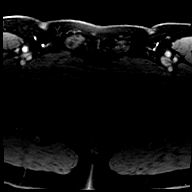

[Series 20: T1 · axial · 3.0mm · 1.15mm/px · 1 of 28 slices shown (9 of 48)]
[im 1/28]
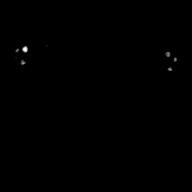

[Series 21: T1 · axial · 3.0mm · 1.15mm/px · 1 of 28 slices shown (10 of 48)]
[im 1/28]
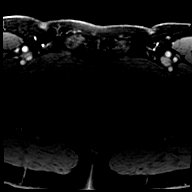

[Series 22: T1 · axial · 3.0mm · 1.15mm/px · 1 of 28 slices shown (11 of 48)]
[im 1/28]
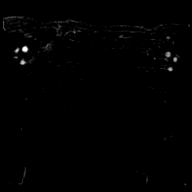

[Series 23: T1 · axial · 3.0mm · 1.15mm/px · 1 of 28 slices shown (12 of 48)]
[im 1/28]
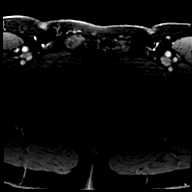

[Series 24: T1 · axial · 3.0mm · 1.15mm/px · 1 of 28 slices shown (13 of 48)]
[im 1/28]
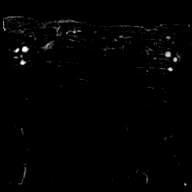

[Series 25: T1 · axial · 3.0mm · 1.15mm/px · 1 of 28 slices shown (14 of 48)]
[im 1/28]
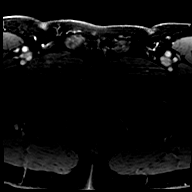

[Series 26: T1 · axial · 3.0mm · 1.15mm/px · 1 of 28 slices shown (15 of 48)]
[im 1/28]
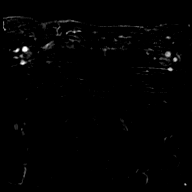

[Series 27: T1 · axial · 3.0mm · 1.15mm/px · 1 of 28 slices shown (16 of 48)]
[im 1/28]
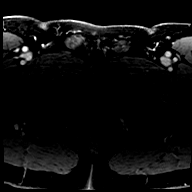

[Series 28: T1 · axial · 3.0mm · 1.15mm/px · 1 of 28 slices shown (17 of 48)]
[im 1/28]
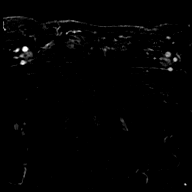

[Series 29: T1 · axial · 3.0mm · 1.15mm/px · 1 of 28 slices shown (18 of 48)]
[im 1/28]
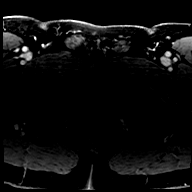

[Series 30: T1 · axial · 3.0mm · 1.15mm/px · 1 of 28 slices shown (19 of 48)]
[im 1/28]
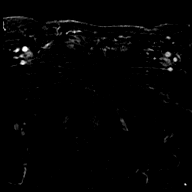

[Series 31: T1 · axial · 3.0mm · 1.15mm/px · 1 of 28 slices shown (20 of 48)]
[im 1/28]
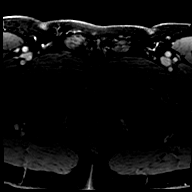

[Series 32: T1 · axial · 3.0mm · 1.15mm/px · 1 of 28 slices shown (21 of 48)]
[im 1/28]
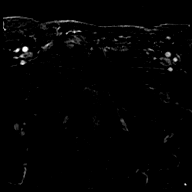

[Series 33: T1 · axial · 3.0mm · 1.15mm/px · 1 of 28 slices shown (22 of 48)]
[im 1/28]
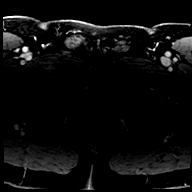

[Series 34: T1 · axial · 3.0mm · 1.15mm/px · 1 of 28 slices shown (23 of 48)]
[im 1/28]
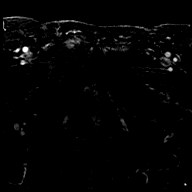

[Series 35: T1 · axial · 3.0mm · 1.15mm/px · 1 of 28 slices shown (24 of 48)]
[im 1/28]
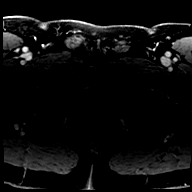

[Series 36: T1 · axial · 3.0mm · 1.15mm/px · 1 of 28 slices shown (25 of 48)]
[im 1/28]
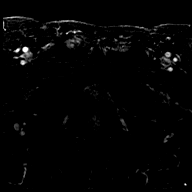

[Series 37: T1 · axial · 3.0mm · 1.15mm/px · 1 of 28 slices shown (26 of 48)]
[im 1/28]
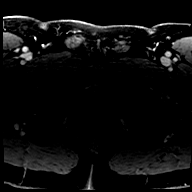

[Series 38: T1 · axial · 3.0mm · 1.15mm/px · 1 of 28 slices shown (27 of 48)]
[im 1/28]
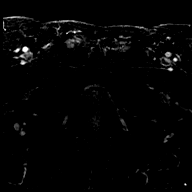

[Series 39: T1 · axial · 3.0mm · 1.15mm/px · 1 of 28 slices shown (28 of 48)]
[im 1/28]
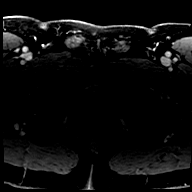

[Series 40: T1 · axial · 3.0mm · 1.15mm/px · 1 of 28 slices shown (29 of 48)]
[im 1/28]
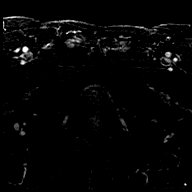

[Series 41: T1 · axial · 3.0mm · 1.15mm/px · 1 of 28 slices shown (30 of 48)]
[im 1/28]
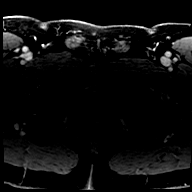

[Series 42: T1 · axial · 3.0mm · 1.15mm/px · 1 of 28 slices shown (31 of 48)]
[im 1/28]
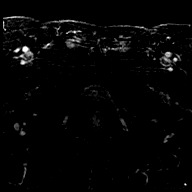

[Series 43: T1 · axial · 3.0mm · 1.15mm/px · 1 of 28 slices shown (32 of 48)]
[im 1/28]
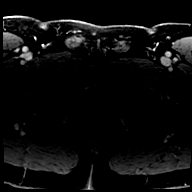

[Series 44: T1 · axial · 3.0mm · 1.15mm/px · 1 of 28 slices shown (33 of 48)]
[im 1/28]
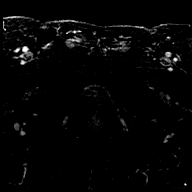

[Series 45: T1 · axial · 3.0mm · 1.15mm/px · 1 of 28 slices shown (34 of 48)]
[im 1/28]
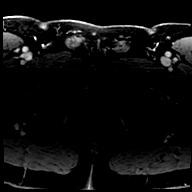

[Series 46: T1 · axial · 3.0mm · 1.15mm/px · 1 of 28 slices shown (35 of 48)]
[im 1/28]
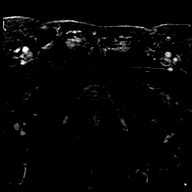

[Series 47: T1 · axial · 3.0mm · 1.15mm/px · 1 of 28 slices shown (36 of 48)]
[im 1/28]
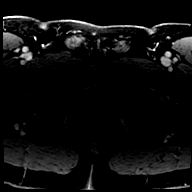

[Series 48: T1 · axial · 3.0mm · 1.15mm/px · 1 of 28 slices shown (37 of 48)]
[im 1/28]
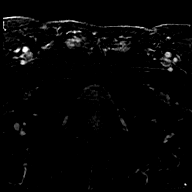

[Series 49: T1 · axial · 3.0mm · 1.15mm/px · 1 of 28 slices shown (38 of 48)]
[im 1/28]
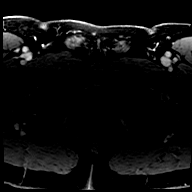

[Series 50: T1 · axial · 3.0mm · 1.15mm/px · 1 of 28 slices shown (39 of 48)]
[im 1/28]
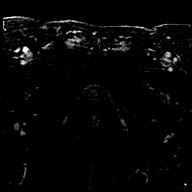

[Series 51: T1 · axial · 3.0mm · 1.15mm/px · 1 of 28 slices shown (40 of 48)]
[im 1/28]
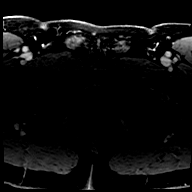

[Series 52: T1 · axial · 3.0mm · 1.15mm/px · 1 of 28 slices shown (41 of 48)]
[im 1/28]
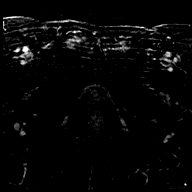

[Series 53: T1 · axial · 3.0mm · 1.15mm/px · 1 of 28 slices shown (42 of 48)]
[im 1/28]
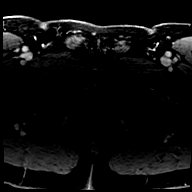

[Series 54: T1 · axial · 3.0mm · 1.15mm/px · 1 of 28 slices shown (43 of 48)]
[im 1/28]
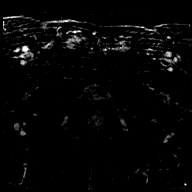

[Series 55: T1 · axial · 3.0mm · 1.15mm/px · 1 of 28 slices shown (44 of 48)]
[im 1/28]
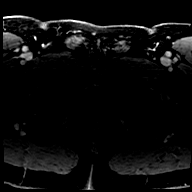

[Series 56: T1 · axial · 3.0mm · 1.15mm/px · 1 of 28 slices shown (45 of 48)]
[im 1/28]
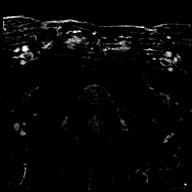

[Series 57: T1 · axial · 3.0mm · 1.15mm/px · 1 of 28 slices shown (46 of 48)]
[im 1/28]
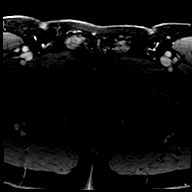

[Series 58: T1 · axial · 3.0mm · 1.15mm/px · 1 of 28 slices shown (47 of 48)]
[im 1/28]
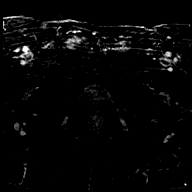

[Series 59: T1 · axial · 3.0mm · 1.15mm/px · 1 of 28 slices shown (48 of 48)]
[im 1/28]
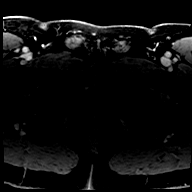

[56 of 56 positions shown; findings below may reference images not displayed]

FINDINGS: Prostate:

Encapsulated nodularity in the transition zone compatible with
benign prostatic hypertrophy.

Scattered hazy low T2 signal is present in the peripheral zone
bilaterally, nonfocal and likely postinflammatory, considered
PI-RADS category 2. The median lobe of the prostate gland indents
the bladder base.

Region of interest # 1: Small PI-RADS category 3 lesion of the left
posteromedial peripheral zone in the mid gland with mild focally
reduced ADC map activity (image 17, series 8), without focal
contrast enhancement. This measures 0.29 cc (0.9 by 0.5 by 0.7 cm).

Volume: 3D volumetric analysis: Prostate volume 79.91 cc (6.4 by
by 5.3 cm).

Transcapsular spread:  Absent

Seminal vesicle involvement: Absent

Neurovascular bundle involvement: Absent

Pelvic adenopathy: Absent

Bone metastasis: Absent

Other findings: Hernia mesh along the right groin.
IMPRESSION: 1. Small PI-RADS category 3 lesion of the left posteromedial
peripheral zone in the mid gland. Targeting data sent to [facility].
2. Prostatomegaly and benign prostatic hypertrophy.

## 2024-03-15 ENCOUNTER — Other Ambulatory Visit: Payer: Self-pay | Admitting: Family Medicine

## 2024-03-15 DIAGNOSIS — K529 Noninfective gastroenteritis and colitis, unspecified: Secondary | ICD-10-CM

## 2024-03-15 NOTE — Telephone Encounter (Signed)
[facility] faxed refill request for the following medications:   sucralfate (CARAFATE) 1 g tablet   Please advise.  

## 2024-04-07 ENCOUNTER — Ambulatory Visit (INDEPENDENT_AMBULATORY_CARE_PROVIDER_SITE_OTHER): Admitting: Family Medicine

## 2024-04-07 ENCOUNTER — Encounter: Payer: Self-pay | Admitting: Family Medicine

## 2024-04-07 VITALS — BP 146/78 | HR 75 | Temp 98.4°F | Ht 70.0 in | Wt 171.8 lb

## 2024-04-07 DIAGNOSIS — Z13220 Encounter for screening for lipoid disorders: Secondary | ICD-10-CM | POA: Diagnosis not present

## 2024-04-07 DIAGNOSIS — Z0001 Encounter for general adult medical examination with abnormal findings: Secondary | ICD-10-CM | POA: Diagnosis not present

## 2024-04-07 DIAGNOSIS — J42 Unspecified chronic bronchitis: Secondary | ICD-10-CM | POA: Diagnosis not present

## 2024-04-07 DIAGNOSIS — I739 Peripheral vascular disease, unspecified: Secondary | ICD-10-CM | POA: Diagnosis not present

## 2024-04-07 DIAGNOSIS — Z5320 Procedure and treatment not carried out because of patient's decision for unspecified reasons: Secondary | ICD-10-CM

## 2024-04-07 DIAGNOSIS — Z131 Encounter for screening for diabetes mellitus: Secondary | ICD-10-CM

## 2024-04-07 DIAGNOSIS — E559 Vitamin D deficiency, unspecified: Secondary | ICD-10-CM

## 2024-04-07 DIAGNOSIS — J309 Allergic rhinitis, unspecified: Secondary | ICD-10-CM | POA: Diagnosis not present

## 2024-04-07 DIAGNOSIS — Z125 Encounter for screening for malignant neoplasm of prostate: Secondary | ICD-10-CM | POA: Diagnosis not present

## 2024-04-07 DIAGNOSIS — I10 Essential (primary) hypertension: Secondary | ICD-10-CM

## 2024-04-07 DIAGNOSIS — Z0000 Encounter for general adult medical examination without abnormal findings: Secondary | ICD-10-CM

## 2024-04-07 DIAGNOSIS — N529 Male erectile dysfunction, unspecified: Secondary | ICD-10-CM | POA: Diagnosis not present

## 2024-04-07 NOTE — Patient Instructions (Addendum)
 To keep you healthy, please keep in mind the following health maintenance items that you are due for:   Health Maintenance Due  Topic Date Due   Zoster Vaccines- Shingrix (1 of 2) 05/08/1999   DTaP/Tdap/Td (2 - Td or Tdap) 04/07/2021   Please ask the pharmacist about both of these vaccines.    Best Wishes,   Dr. Lang

## 2024-04-07 NOTE — Progress Notes (Signed)
 Chief Complaint  Patient presents with   Annual Exam    Patient presents for AWV with PCP.  Diet exercise  Vaccines: Screenings:      Subjective:   Robert Patton is a 73 y.o. male who presents for a Medicare Annual Wellness Visit.  Discussed the use of AI scribe software for clinical note transcription with the patient, who gave verbal consent to proceed.  History of Present Illness Robert Patton is a 73 year old male who presents for an annual wellness visit.  He maintains an active lifestyle, regularly attending a gym and engaging in activities such as biking, weight lifting, and swimming. He is planning a trip to [place] and has family in [place] .  He has a history of hypertension, with blood pressure recorded at 159/82 mmHg during today's visit. Home readings are typically lower, around 125/70 mmHg. He takes chlorthalidone  and amlodipine  5 mg for blood pressure management and adjusts his medication based on his blood pressure readings, sometimes skipping doses if his blood pressure is too low.  He has a history of chronic back pain and cervical disc disease but remains physically active. He recently underwent cataract extraction last month.  He has a history of vitamin D  deficiency and is due for a check of his vitamin D  levels. He also has a history of chronic bronchitis, erectile dysfunction, osteoarthritis of the knee, and peripheral vascular disease.  He has received a flu shot and plans to get a COVID-19 vaccine before traveling to [place].   Allergies (verified) Patient has no known allergies.   History: Past Medical History:  Diagnosis Date   Arthritis    ED (erectile dysfunction)    Hyperlipidemia    Hypertension    Past Surgical History:  Procedure Laterality Date   COLONOSCOPY  08/15/2009   COLONOSCOPY WITH PROPOFOL  N/A 10/08/2019   Procedure: COLONOSCOPY WITH PROPOFOL ;  Surgeon: Unk Corinn Skiff, MD;  Location: [facility] ENDOSCOPY;  Service:  Gastroenterology;  Laterality: N/A;   EYE SURGERY Right 2016   CATARACT EXTRACTION   INGUINAL HERNIA REPAIR Right 07/22/2017   Procedure: HERNIA REPAIR INGUINAL ADULT;  Surgeon: Kassie Ozell SAUNDERS, MD;  Location: [facility] ORS;  Service: Urology;  Laterality: Right;   PROSTATE BIOPSY N/A 11/08/2021   Procedure: PROSTATE BIOPSY GRAYCE;  Surgeon: Kassie Ozell SAUNDERS, MD;  Location: [facility] ORS;  Service: Urology;  Laterality: N/A;   Family History  Problem Relation Age of Onset   Hypertension Brother    Social History   Occupational History   Occupation: CNA  Tobacco Use   Smoking status: Never    Passive exposure: Never   Smokeless tobacco: Never  Vaping Use   Vaping status: Never Used  Substance and Sexual Activity   Alcohol use: No   Drug use: No   Sexual activity: Not Currently   Tobacco Counseling Counseling given: Not Answered  SDOH Screenings   Food Insecurity: No Food Insecurity (10/06/2023)  Housing: Unknown (10/06/2023)  Transportation Needs: No Transportation Needs (10/06/2023)  Utilities: Not At Risk (10/06/2023)  Alcohol Screen: Low Risk  (10/06/2023)  Depression (PHQ2-9): Low Risk  (04/07/2024)  Financial Resource Strain: Low Risk  (10/06/2023)  Physical Activity: Sufficiently Active (03/18/2022)  Social Connections: Moderately Integrated (03/18/2022)  Stress: No Stress Concern Present (10/06/2023)  Tobacco Use: Low Risk  (04/07/2024)  Health Literacy: Adequate Health Literacy (10/06/2023)   Depression Screen    04/07/2024    9:42 AM 04/07/2023   11:03 AM 08/01/2022   11:17 AM 03/18/2022  10:06 AM 01/30/2022   10:39 AM 07/25/2021    8:48 AM 05/10/2021    9:00 AM  PHQ 2/9 Scores  PHQ - 2 Score 0 6 0 0 0 0 0  PHQ- 9 Score 0 6   0  0  0  0      Data saved with a previous flowsheet row definition     Goals Addressed   None    Visit info / Clinical Intake: Medicare Wellness Visit Type:: Subsequent Annual Wellness Visit Persons participating in visit:: patient Medicare  Wellness Visit Mode:: In-person (required for WTM) Information given by:: patient Interpreter Needed?: No Pre-visit prep was completed: no AWV questionnaire completed by patient prior to visit?: no Living arrangements:: lives with spouse/significant other Patient's Overall Health Status Rating: excellent Typical amount of pain: none Does pain affect daily life?: no Are you currently prescribed opioids?: no  Dietary Habits and Nutritional Risks How many meals a day?: 3 Eats fruit and vegetables daily?: yes Most meals are obtained by: preparing own meals In the last 2 weeks, have you had any of the following?: none Diabetic:: no  Functional Status Activities of Daily Living (to include ambulation/medication): Independent Ambulation: Independent Medication Administration: Independent Home Management: Independent Manage your own finances?: yes Primary transportation is: driving Concerns about vision?: no *vision screening is required for WTM* Concerns about hearing?: no  Fall Screening Falls in the past year?: 0 Number of falls in past year: 0 Was there an injury with Fall?: 0 Fall Risk Category Calculator: 0 Patient Fall Risk Level: Low Fall Risk  Fall Risk Patient at Risk for Falls Due to: No Fall Risks Fall risk Follow up: Falls evaluation completed  Home and Transportation Safety: All rugs have non-skid backing?: (!) no All stairs or steps have railings?: yes Grab bars in the bathtub or shower?: (!) no Have non-skid surface in bathtub or shower?: (!) no Good home lighting?: yes Regular seat belt use?: yes Hospital stays in the last year:: (!) yes How many hospital stays:: 1 Reason: Cataract surgery outpatient  Cognitive Assessment Difficulty concentrating, remembering, or making decisions? : no Will 6CIT or Mini Cog be Completed: yes What year is it?: 0 points What month is it?: 0 points Give patient an address phrase to remember (5 components): [place] About what time is it?: 0 points Count backwards from 20 to 1: 0 points Say the months of the year in reverse: 0 points Repeat the address phrase from earlier: 2 points 6 CIT Score: 2 points  Advance Directives (For Healthcare) Does Patient Have a Medical Advance Directive?: No Would patient like information on creating a medical advance directive?: No - Patient declined        Objective:    Today's Vitals   04/07/24 0928 04/07/24 1111  BP: (!) 159/82 (!) 146/78  Pulse: 75   Temp: 98.4 F (36.9 C)   TempSrc: Oral   SpO2: 99%   Weight: 171 lb 12.8 oz (77.9 kg)   Height: 5' 10 (1.778 m)    Body mass index is 24.65 kg/m.  Physical Exam Constitutional:      General: He is not in acute distress.    Appearance: Normal appearance. He is not ill-appearing.  HENT:     Mouth/Throat:     Mouth: Mucous membranes are moist.  Eyes:     General: No scleral icterus.       Right eye: No discharge.  Left eye: No discharge.     Extraocular Movements: Extraocular movements intact.     Conjunctiva/sclera: Conjunctivae normal.     Pupils: Pupils are equal, round, and reactive to light.  Cardiovascular:     Rate and Rhythm: Normal rate and regular rhythm.     Heart sounds: Normal heart sounds.  Pulmonary:     Effort: Pulmonary effort is normal.     Breath sounds: Normal breath sounds.  Abdominal:     General: Bowel sounds are normal. There is no distension.     Palpations: Abdomen is soft.     Tenderness: There is no abdominal tenderness.  Musculoskeletal:        General: No deformity or signs of injury. Normal range of motion.     Cervical back: Normal range of motion and neck supple. No tenderness.     Right lower leg: No edema.     Left lower leg: No edema.  Lymphadenopathy:     Cervical: No cervical adenopathy.  Neurological:     Mental Status: He is alert and oriented to person, place, and time.     Cranial Nerves: No cranial nerve deficit.      Motor: No weakness.     Gait: Gait normal.  Psychiatric:        Mood and Affect: Mood normal.        Behavior: Behavior normal.     Current Medications (verified) Outpatient Encounter Medications as of 04/07/2024  Medication Sig   acetaminophen  (TYLENOL ) 500 MG tablet Take 1,000 mg by mouth every 6 (six) hours as needed (FOR PAIN.).   amLODipine  (NORVASC ) 5 MG tablet TAKE 1 TABLET(5 MG) BY MOUTH DAILY   bismuth  subsalicylate (PEPTO BISMOL) 262 MG chewable tablet Chew 2 tablets (524 mg total) by mouth 4 (four) times daily -  before meals and at bedtime.   Calcium-Magnesium-Zinc (CAL-MAG-ZINC PO) Take 1 tablet by mouth daily.   carboxymethylcellulose 1 % ophthalmic solution Place 1-2 drops into both eyes 2 (two) times daily.   chlorthalidone  (HYGROTON ) 50 MG tablet TAKE 1 TABLET(50 MG) BY MOUTH EVERY MORNING   gabapentin (NEURONTIN) 100 MG capsule Take 100 mg by mouth 2 (two) times daily.   Multiple Vitamin (MULTIVITAMIN WITH MINERALS) TABS tablet Take 1 tablet by mouth daily.   pantoprazole  (PROTONIX ) 40 MG tablet TAKE 1 TABLET(40 MG) BY MOUTH TWICE DAILY BEFORE A MEAL   sildenafil  (VIAGRA ) 25 MG tablet Take 1-4 tablets (25-100 mg total) by mouth daily as needed for erectile dysfunction (take 45 minutes prior to sexual activity).   sucralfate  (CARAFATE ) 1 g tablet TAKE 1 TABLET BY MOUTH FOUR TIMES DAILY WITH MEALS AND AT BEDTIME   No facility-administered encounter medications on file as of 04/07/2024.   Hearing/Vision screen No results found. Immunizations and Health Maintenance Health Maintenance  Topic Date Due   Zoster Vaccines- Shingrix (1 of 2) 05/08/1999   DTaP/Tdap/Td (2 - Td or Tdap) 04/07/2021   COVID-19 Vaccine (8 - 2025-26 season) 04/23/2024 (Originally 01/26/2024)   Medicare Annual Wellness (AWV)  04/07/2025   Colonoscopy  10/07/2029   Pneumococcal Vaccine: 50+ Years  Completed   Influenza Vaccine  Completed   Hepatitis C Screening  Completed   Meningococcal B Vaccine   Aged Out        Assessment/Plan:  This is a routine wellness examination for Robert Patton.  Patient Care Team: Sharma Coyer, MD as PCP - General (Family Medicine) Unk Corinn Skiff, MD as Consulting Physician (Gastroenterology) Kassie Ozell SAUNDERS, MD  as Consulting Physician (Urology)  I have personally reviewed and noted the following in the patient's chart:   Medical and social history Use of alcohol, tobacco or illicit drugs  Current medications and supplements including opioid prescriptions. Functional ability and status Nutritional status Physical activity Advanced directives List of other physicians Hospitalizations, surgeries, and ER visits in previous 12 months Vitals Screenings to include cognitive, depression, and falls Referrals and appointments   Assessment and Plan Assessment & Plan Adult Wellness Visit Annual wellness visit conducted. Overall doing well. Engages in regular physical activity including biking, weight lifting, and swimming. Declines COVID vaccine recommendation. - Ordered complete metabolic panel, A1c, and vitamin D  level - Recommended Shingrix vaccine and updated tetanus booster vaccine - Advised to obtain COVID vaccine at pharmacy before travel  Essential hypertension Blood pressure elevated at 159/82 mmHg during visit. Home readings are generally within normal range. Currently on chlorthalidone  and amlodipine . Adjusts medication based on home blood pressure readings to avoid hypotension. - Rechecked blood pressure during visit - Continue current antihypertensive medications - Monitor blood pressure at home and adjust medication as needed - Will schedule follow-up in 3 months if blood pressure remains elevated  Vitamin D  deficiency Vitamin D  levels need to be checked as part of routine blood work. - Ordered vitamin D  level  Male erectile dysfunction Erectile dysfunction noted. PSA level to be checked as part of routine blood work. -  Ordered PSA level  General Health Maintenance Discussed vaccinations including Shingrix and tetanus booster. Shingrix vaccine provides 90% protection against shingles. Advised to receive Shingrix when not planning any upcoming activities due to potential flu-like symptoms. - Recommended Shingrix vaccine and updated tetanus booster vaccine - Advised to obtain Shingrix vaccine after returning from [place]   Orders Placed This Encounter  Procedures   Hemoglobin A1c   BMP8+EGFR   VITAMIN D  25 Hydroxy (Vit-D Deficiency, Fractures)   In addition, I have reviewed and discussed with patient certain preventive protocols, quality metrics, and best practice recommendations. A written personalized care plan for preventive services as well as general preventive health recommendations were provided to patient.   Rockie Agent, MD   04/07/2024   Return in about 4 months (around 08/05/2024) for Chronic F/U.

## 2024-04-08 LAB — BMP8+EGFR
BUN/Creatinine Ratio: 21 (ref 10–24)
BUN: 23 mg/dL (ref 8–27)
CO2: 27 mmol/L (ref 20–29)
Calcium: 9.7 mg/dL (ref 8.6–10.2)
Chloride: 100 mmol/L (ref 96–106)
Creatinine, Ser: 1.07 mg/dL (ref 0.76–1.27)
Glucose: 96 mg/dL (ref 70–99)
Potassium: 3.7 mmol/L (ref 3.5–5.2)
Sodium: 141 mmol/L (ref 134–144)
eGFR: 73 mL/min/1.73 (ref >59)

## 2024-04-08 LAB — VITAMIN D 25 HYDROXY (VIT D DEFICIENCY, FRACTURES): Vit D, 25-Hydroxy: 32.8 ng/mL (ref 30.0–100.0)

## 2024-04-08 LAB — HEMOGLOBIN A1C
Est. average glucose Bld gHb Est-mCnc: 120 mg/dL
Hgb A1c MFr Bld: 5.8 % — ABNORMAL HIGH (ref 4.8–5.6)

## 2024-04-14 ENCOUNTER — Ambulatory Visit: Payer: Self-pay | Admitting: Family Medicine

## 2024-04-28 DIAGNOSIS — R200 Anesthesia of skin: Secondary | ICD-10-CM | POA: Diagnosis not present

## 2024-04-28 DIAGNOSIS — L539 Erythematous condition, unspecified: Secondary | ICD-10-CM | POA: Diagnosis not present

## 2024-04-28 DIAGNOSIS — R609 Edema, unspecified: Secondary | ICD-10-CM | POA: Diagnosis not present

## 2024-04-28 DIAGNOSIS — M79671 Pain in right foot: Secondary | ICD-10-CM | POA: Diagnosis not present

## 2024-04-28 DIAGNOSIS — R202 Paresthesia of skin: Secondary | ICD-10-CM | POA: Diagnosis not present

## 2024-04-28 DIAGNOSIS — Z1331 Encounter for screening for depression: Secondary | ICD-10-CM | POA: Diagnosis not present

## 2024-05-03 ENCOUNTER — Telehealth: Payer: Self-pay

## 2024-05-03 NOTE — Telephone Encounter (Signed)
 Spoke with patient and advised I am mailing this to his home address

## 2024-05-03 NOTE — Telephone Encounter (Signed)
 Copied from CRM #[id number]. Topic: Clinical - Lab/Test Results >> Apr 30, 2024 11:50 AM Ahlexyia S wrote: Reason for CRM: Pt called in requesting a copy of his most recent lab results sent to his home address. Pt is requesting a callback for this. Advised pt of turnaround time for callbacks.

## 2024-05-03 NOTE — Telephone Encounter (Signed)
 Copied from CRM #[id number]. Topic: Clinical - Lab/Test Results >> Apr 30, 2024 11:50 AM Robert Patton wrote: Reason for CRM: Pt called in requesting a copy of his most recent lab results sent to his home address. Pt is requesting a callback for this. Advised pt of turnaround time for callbacks.

## 2024-05-04 DIAGNOSIS — M79672 Pain in left foot: Secondary | ICD-10-CM | POA: Diagnosis not present

## 2024-05-04 DIAGNOSIS — G5752 Tarsal tunnel syndrome, left lower limb: Secondary | ICD-10-CM | POA: Diagnosis not present

## 2024-05-07 ENCOUNTER — Other Ambulatory Visit: Payer: Self-pay | Admitting: Family Medicine

## 2024-05-07 DIAGNOSIS — I10 Essential (primary) hypertension: Secondary | ICD-10-CM

## 2024-08-05 ENCOUNTER — Ambulatory Visit: Admitting: Family Medicine

## 2024-09-22 ENCOUNTER — Ambulatory Visit: Admitting: Urology

## 2024-09-23 ENCOUNTER — Ambulatory Visit: Admitting: Urology
